# Patient Record
Sex: Male | Born: 1950 | Race: White | Hispanic: No | Marital: Married | Smoking: Current every day smoker
Health system: Southern US, Community
[De-identification: ages and names within clinical notes are randomized; demographics above are authoritative.]

## PROBLEM LIST (undated history)

## (undated) DIAGNOSIS — Z8639 Personal history of other endocrine, nutritional and metabolic disease: Secondary | ICD-10-CM

## (undated) DIAGNOSIS — I10 Essential (primary) hypertension: Secondary | ICD-10-CM

## (undated) DIAGNOSIS — G8929 Other chronic pain: Secondary | ICD-10-CM

## (undated) DIAGNOSIS — D751 Secondary polycythemia: Secondary | ICD-10-CM

## (undated) DIAGNOSIS — E669 Obesity, unspecified: Secondary | ICD-10-CM

## (undated) DIAGNOSIS — J449 Chronic obstructive pulmonary disease, unspecified: Secondary | ICD-10-CM

## (undated) DIAGNOSIS — I2781 Cor pulmonale (chronic): Secondary | ICD-10-CM

## (undated) DIAGNOSIS — Z87898 Personal history of other specified conditions: Secondary | ICD-10-CM

## (undated) DIAGNOSIS — Z8701 Personal history of pneumonia (recurrent): Secondary | ICD-10-CM

## (undated) DIAGNOSIS — K219 Gastro-esophageal reflux disease without esophagitis: Secondary | ICD-10-CM

## (undated) DIAGNOSIS — G4733 Obstructive sleep apnea (adult) (pediatric): Secondary | ICD-10-CM

## (undated) HISTORY — DX: Gastro-esophageal reflux disease without esophagitis: K21.9

## (undated) HISTORY — DX: Obstructive sleep apnea (adult) (pediatric): G47.33

## (undated) HISTORY — DX: Other chronic pain: G89.29

## (undated) HISTORY — PX: VASECTOMY: SHX75

## (undated) HISTORY — DX: Personal history of other specified conditions: Z87.898

## (undated) HISTORY — DX: Personal history of other endocrine, nutritional and metabolic disease: Z86.39

## (undated) HISTORY — DX: Personal history of pneumonia (recurrent): Z87.01

## (undated) HISTORY — DX: Essential (primary) hypertension: I10

## (undated) HISTORY — DX: Cor pulmonale (chronic): I27.81

## (undated) HISTORY — DX: Chronic obstructive pulmonary disease, unspecified: J44.9

## (undated) HISTORY — PX: NEUROPLASTY / TRANSPOSITION MEDIAN NERVE AT CARPAL TUNNEL: SUR893

---

## 2004-12-17 ENCOUNTER — Ambulatory Visit: Payer: Self-pay | Admitting: Family Medicine

## 2006-04-08 ENCOUNTER — Ambulatory Visit (HOSPITAL_COMMUNITY): Admission: RE | Admit: 2006-04-08 | Discharge: 2006-04-08 | Payer: Self-pay | Admitting: Orthopaedic Surgery

## 2006-07-29 HISTORY — PX: CARPAL TUNNEL RELEASE: SHX101

## 2013-04-26 ENCOUNTER — Ambulatory Visit: Payer: Self-pay | Admitting: Cardiology

## 2013-05-04 ENCOUNTER — Ambulatory Visit: Payer: Self-pay | Admitting: Cardiology

## 2014-06-10 ENCOUNTER — Inpatient Hospital Stay (HOSPITAL_COMMUNITY)
Admission: EM | Admit: 2014-06-10 | Discharge: 2014-06-16 | DRG: 291 | Disposition: A | Discharge status: Home / Self Care | Payer: Managed Care, Other (non HMO) | Attending: Internal Medicine | Admitting: Internal Medicine

## 2014-06-10 ENCOUNTER — Encounter (HOSPITAL_COMMUNITY): Payer: Self-pay | Admitting: *Deleted

## 2014-06-10 ENCOUNTER — Emergency Department (HOSPITAL_COMMUNITY): Payer: Managed Care, Other (non HMO)

## 2014-06-10 DIAGNOSIS — E669 Obesity, unspecified: Secondary | ICD-10-CM

## 2014-06-10 DIAGNOSIS — G4733 Obstructive sleep apnea (adult) (pediatric): Secondary | ICD-10-CM | POA: Diagnosis present

## 2014-06-10 DIAGNOSIS — J9621 Acute and chronic respiratory failure with hypoxia: Secondary | ICD-10-CM | POA: Diagnosis present

## 2014-06-10 DIAGNOSIS — I10 Essential (primary) hypertension: Secondary | ICD-10-CM | POA: Diagnosis present

## 2014-06-10 DIAGNOSIS — F17210 Nicotine dependence, cigarettes, uncomplicated: Secondary | ICD-10-CM | POA: Diagnosis present

## 2014-06-10 DIAGNOSIS — G4730 Sleep apnea, unspecified: Secondary | ICD-10-CM

## 2014-06-10 DIAGNOSIS — I5031 Acute diastolic (congestive) heart failure: Principal | ICD-10-CM | POA: Diagnosis present

## 2014-06-10 DIAGNOSIS — D45 Polycythemia vera: Secondary | ICD-10-CM | POA: Diagnosis present

## 2014-06-10 DIAGNOSIS — Z9119 Patient's noncompliance with other medical treatment and regimen: Secondary | ICD-10-CM | POA: Diagnosis present

## 2014-06-10 DIAGNOSIS — J9620 Acute and chronic respiratory failure, unspecified whether with hypoxia or hypercapnia: Secondary | ICD-10-CM

## 2014-06-10 DIAGNOSIS — I509 Heart failure, unspecified: Secondary | ICD-10-CM

## 2014-06-10 DIAGNOSIS — Z6841 Body Mass Index (BMI) 40.0 and over, adult: Secondary | ICD-10-CM | POA: Diagnosis not present

## 2014-06-10 DIAGNOSIS — R0602 Shortness of breath: Secondary | ICD-10-CM | POA: Insufficient documentation

## 2014-06-10 DIAGNOSIS — Z79899 Other long term (current) drug therapy: Secondary | ICD-10-CM

## 2014-06-10 DIAGNOSIS — I272 Other secondary pulmonary hypertension: Secondary | ICD-10-CM | POA: Diagnosis present

## 2014-06-10 DIAGNOSIS — D751 Secondary polycythemia: Secondary | ICD-10-CM | POA: Diagnosis present

## 2014-06-10 DIAGNOSIS — J441 Chronic obstructive pulmonary disease with (acute) exacerbation: Secondary | ICD-10-CM | POA: Diagnosis present

## 2014-06-10 HISTORY — DX: Obesity, unspecified: E66.9

## 2014-06-10 HISTORY — DX: Secondary polycythemia: D75.1

## 2014-06-10 LAB — HEPATIC FUNCTION PANEL
ALT: 31 U/L (ref 0–53)
AST: 57 U/L — AB (ref 0–37)
Albumin: 3.4 g/dL — ABNORMAL LOW (ref 3.5–5.2)
Alkaline Phosphatase: 60 U/L (ref 39–117)
Bilirubin, Direct: <0.2 mg/dL (ref 0.0–0.3)
TOTAL PROTEIN: 6.2 g/dL (ref 6.0–8.3)
Total Bilirubin: 1.2 mg/dL (ref 0.3–1.2)

## 2014-06-10 LAB — BASIC METABOLIC PANEL
Anion gap: 14 (ref 5–15)
BUN: 19 mg/dL (ref 6–23)
CO2: 27 mEq/L (ref 19–32)
CREATININE: 0.95 mg/dL (ref 0.50–1.35)
Calcium: 8.7 mg/dL (ref 8.4–10.5)
Chloride: 98 mEq/L (ref 96–112)
GFR calc non Af Amer: 88 mL/min — ABNORMAL LOW (ref >90)
Glucose, Bld: 96 mg/dL (ref 70–99)
Potassium: 5.1 mEq/L (ref 3.7–5.3)
Sodium: 139 mEq/L (ref 137–147)

## 2014-06-10 LAB — I-STAT TROPONIN, ED: Troponin i, poc: 0 ng/mL (ref 0.00–0.08)

## 2014-06-10 LAB — CBC
HEMATOCRIT: 61.7 % — AB (ref 39.0–52.0)
Hemoglobin: 19.6 g/dL — ABNORMAL HIGH (ref 13.0–17.0)
MCH: 27.1 pg (ref 26.0–34.0)
MCHC: 31.8 g/dL (ref 30.0–36.0)
MCV: 85.3 fL (ref 78.0–100.0)
Platelets: 121 10*3/uL — ABNORMAL LOW (ref 150–400)
RBC: 7.23 MIL/uL — ABNORMAL HIGH (ref 4.22–5.81)
RDW: 20.3 % — AB (ref 11.5–15.5)
WBC: 8.8 10*3/uL (ref 4.0–10.5)

## 2014-06-10 LAB — TROPONIN I: Troponin I: <0.3 ng/mL (ref <0.30)

## 2014-06-10 LAB — PRO B NATRIURETIC PEPTIDE: Pro B Natriuretic peptide (BNP): 2021 pg/mL — ABNORMAL HIGH (ref 0–125)

## 2014-06-10 MED ORDER — POLYETHYLENE GLYCOL 3350 17 G PO PACK
17.0000 g | PACK | Freq: Every day | ORAL | Status: DC | PRN
  Filled 2014-06-10: qty 1

## 2014-06-10 MED ORDER — NITROGLYCERIN 2 % TD OINT
0.5000 [in_us] | TOPICAL_OINTMENT | Freq: Four times a day (QID) | TRANSDERMAL | Status: DC
  Administered 2014-06-11 – 2014-06-15 (×17): 0.5 [in_us] via TOPICAL
  Filled 2014-06-10: qty 30
  Filled 2014-06-10: qty 1

## 2014-06-10 MED ORDER — ALUM & MAG HYDROXIDE-SIMETH 200-200-20 MG/5ML PO SUSP: 30.0000 mL | Freq: Four times a day (QID) | ORAL | Status: DC | PRN

## 2014-06-10 MED ORDER — DOXYCYCLINE HYCLATE 100 MG PO TABS
100.0000 mg | ORAL_TABLET | Freq: Two times a day (BID) | ORAL | Status: DC
  Filled 2014-06-10 (×3): qty 1

## 2014-06-10 MED ORDER — HYDROCODONE-ACETAMINOPHEN 5-325 MG PO TABS
1.0000 | ORAL_TABLET | ORAL | Status: DC | PRN
  Administered 2014-06-11 – 2014-06-16 (×10): 2 via ORAL
  Filled 2014-06-10 (×11): qty 2

## 2014-06-10 MED ORDER — SODIUM CHLORIDE 0.9 % IJ SOLN
3.0000 mL | Freq: Two times a day (BID) | INTRAMUSCULAR | Status: DC
  Administered 2014-06-10 – 2014-06-16 (×10): 3 mL via INTRAVENOUS

## 2014-06-10 MED ORDER — GUAIFENESIN-DM 100-10 MG/5ML PO SYRP
5.0000 mL | ORAL_SOLUTION | ORAL | Status: DC | PRN
Start: 2014-06-10 — End: 2014-06-16

## 2014-06-10 MED ORDER — ONDANSETRON HCL 4 MG/2ML IJ SOLN: 4.0000 mg | Freq: Four times a day (QID) | INTRAMUSCULAR | Status: DC | PRN

## 2014-06-10 MED ORDER — FAMOTIDINE 40 MG PO TABS
40.0000 mg | ORAL_TABLET | Freq: Every day | ORAL | Status: DC
  Administered 2014-06-10 – 2014-06-15 (×6): 40 mg via ORAL
  Filled 2014-06-10 (×7): qty 1

## 2014-06-10 MED ORDER — CARVEDILOL 3.125 MG PO TABS
3.1250 mg | ORAL_TABLET | Freq: Two times a day (BID) | ORAL | Status: DC
  Administered 2014-06-11 – 2014-06-12 (×4): 3.125 mg via ORAL
  Filled 2014-06-10 (×5): qty 1

## 2014-06-10 MED ORDER — ALBUTEROL SULFATE (2.5 MG/3ML) 0.083% IN NEBU
2.5000 mg | INHALATION_SOLUTION | Freq: Four times a day (QID) | RESPIRATORY_TRACT | Status: DC
  Administered 2014-06-10: 2.5 mg via RESPIRATORY_TRACT
  Filled 2014-06-10: qty 3

## 2014-06-10 MED ORDER — METHYLPREDNISOLONE SODIUM SUCC 125 MG IJ SOLR
60.0000 mg | Freq: Three times a day (TID) | INTRAMUSCULAR | Status: DC
  Administered 2014-06-12 – 2014-06-13 (×3): 60 mg via INTRAVENOUS
  Filled 2014-06-10 (×11): qty 0.96

## 2014-06-10 MED ORDER — IPRATROPIUM-ALBUTEROL 0.5-2.5 (3) MG/3ML IN SOLN: 3.0000 mL | Freq: Four times a day (QID) | RESPIRATORY_TRACT | Status: DC | PRN

## 2014-06-10 MED ORDER — ONDANSETRON HCL 4 MG PO TABS: 4.0000 mg | ORAL_TABLET | Freq: Four times a day (QID) | ORAL | Status: DC | PRN

## 2014-06-10 MED ORDER — TIOTROPIUM BROMIDE MONOHYDRATE 18 MCG IN CAPS
18.0000 ug | ORAL_CAPSULE | Freq: Every day | RESPIRATORY_TRACT | Status: DC
  Administered 2014-06-10 – 2014-06-16 (×7): 18 ug via RESPIRATORY_TRACT
  Filled 2014-06-10 (×2): qty 5

## 2014-06-10 MED ORDER — ASPIRIN EC 81 MG PO TBEC
81.0000 mg | DELAYED_RELEASE_TABLET | Freq: Every day | ORAL | Status: DC
  Administered 2014-06-10 – 2014-06-16 (×7): 81 mg via ORAL
  Filled 2014-06-10 (×7): qty 1

## 2014-06-10 MED ORDER — NITROGLYCERIN 2 % TD OINT
1.0000 [in_us] | TOPICAL_OINTMENT | Freq: Once | TRANSDERMAL | Status: AC
  Administered 2014-06-10: 1 [in_us] via TOPICAL

## 2014-06-10 MED ORDER — FUROSEMIDE 10 MG/ML IJ SOLN
60.0000 mg | Freq: Once | INTRAMUSCULAR | Status: AC
  Administered 2014-06-10: 60 mg via INTRAVENOUS
  Filled 2014-06-10: qty 6

## 2014-06-10 MED ORDER — ENOXAPARIN SODIUM 40 MG/0.4ML ~~LOC~~ SOLN
40.0000 mg | SUBCUTANEOUS | Status: DC
  Administered 2014-06-10: 40 mg via SUBCUTANEOUS
  Filled 2014-06-10 (×7): qty 0.4

## 2014-06-10 MED ORDER — FUROSEMIDE 10 MG/ML IJ SOLN
40.0000 mg | Freq: Two times a day (BID) | INTRAMUSCULAR | Status: DC
  Administered 2014-06-11 – 2014-06-16 (×10): 40 mg via INTRAVENOUS
  Filled 2014-06-10 (×14): qty 4

## 2014-06-10 NOTE — H&P (Signed)
Patient Demographics  Philip Powell, is a 63 y.o. male  MRN: [id number]   DOB - 1951/07/13  Admit Date - 06/10/2014  Outpatient Primary MD for the patient is No PCP Per Patient   With History of -  Past Medical History  Diagnosis Date  . Hypertension   . Bronchitis   . Polycythemia   . Sleep apnea   . Obesity       History reviewed. No pertinent past surgical history.  in for   Chief Complaint  Patient presents with  . Shortness of Breath  . Leg Swelling     HPI  Philip Powell  is a 64 y.o. 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   Review of Systems    In addition to the HPI above,   No Fever-chills, No Headache, No changes with Vision or hearing, No problems swallowing food or Liquids, No Chest pain, Cough , positive exertional Shortness of Breath some wheezing, No Abdominal pain, No Nausea or Vommitting, Bowel movements are regular, No Blood in stool or Urine, No dysuria, No new skin rashes or bruises, No new joints pains-aches,  No new weakness, tingling, numbness in any extremity, No recent weight  Loss, positive weight gain and edema No polyuria, polydypsia or polyphagia, No significant Mental  Stressors.  A full 10 point Review of Systems was done, except as stated above, all other Review of Systems were negative.   Social History History  Substance Use Topics  . Smoking status: Current Every Day Smoker    Types: Cigarettes  . Smokeless tobacco: Not on file  . Alcohol Use: No      Family History No history of CAD at young age  Prior to Admission medications   Medication Sig Start Date End Date Taking? Authorizing Provider  HYDROcodone-acetaminophen (NORCO) 7.5-325 MG per tablet Take 1 tablet by mouth every 6 (six) hours. 06/03/14  Yes Historical Provider, MD  ibuprofen (ADVIL,MOTRIN) 200 MG tablet Take 800 mg by mouth every 6 (six) hours as needed for mild pain.   Yes Historical Provider, MD  Multiple Vitamins-Minerals (CENTRUM ADULTS) TABS Take 1 tablet by mouth daily.   Yes Historical Provider, MD  ranitidine (ZANTAC) 150 MG tablet Take 150 mg by mouth 2 (two) times daily.   Yes Historical Provider, MD    No Known Allergies  Physical Exam  Vitals  Blood pressure 129/88, pulse 101, temperature 98.4 F (36.9 C), temperature source Oral, resp. rate 17, height 6' (1.829 m), weight 149.687 kg (330 lb), SpO2 94 %.   1. General Middle-aged morbidly obese Caucasian male lying in bed in NAD   2. Normal affect and insight, Not Suicidal or Homicidal, Awake Alert, Oriented X 3.  3. No F.N deficits, ALL C.Nerves Intact, Strength 5/5 all 4 extremities, Sensation intact all 4 extremities, Plantars down going.  4. Ears and Eyes appear Normal, Conjunctivae clear, PERRLA. Moist Oral Mucosa.  5. Supple Neck, elevated JVD, No cervical lymphadenopathy appriciated, No Carotid Bruits.  6. Symmetrical Chest wall movement, Good air movement bilaterally, bilateral rales and minimal wheezing  7. RRR, No Gallops, Rubs or Murmurs, No Parasternal Heave.  8. Positive Bowel Sounds, Abdomen Soft, No tenderness, No organomegaly appriciated,No rebound -guarding or rigidity.  9.  No  Cyanosis, Normal Skin Turgor, No Skin Rash or Bruise.2+ leg edema  10. Good muscle tone,  joints appear normal , no effusions, Normal ROM.  11. No Palpable Lymph Nodes in Neck or Axillae     Data Review  CBC  Recent Labs Lab 06/10/14 1518  WBC 8.8  HGB 19.6*  HCT 61.7*  PLT 121*  MCV 85.3  MCH 27.1  MCHC 31.8  RDW 20.3*   ------------------------------------------------------------------------------------------------------------------  Chemistries   Recent Labs Lab 06/10/14 1518  NA 139  K 5.1  CL 98  CO2 27  GLUCOSE 96  BUN 19  CREATININE 0.95  CALCIUM 8.7  AST 57*  ALT 31  ALKPHOS 60  BILITOT 1.2   ------------------------------------------------------------------------------------------------------------------ estimated creatinine clearance is 122.9 mL/min (by C-G formula based on Cr of 0.95). ------------------------------------------------------------------------------------------------------------------ No results for input(s): TSH, T4TOTAL, T3FREE, THYROIDAB in the last 72 hours.  Invalid input(s): FREET3   Coagulation profile No results for input(s): INR, PROTIME in the last 168 hours. ------------------------------------------------------------------------------------------------------------------- No results for input(s): DDIMER in the last 72 hours. -------------------------------------------------------------------------------------------------------------------  Cardiac Enzymes  Recent Labs Lab 06/10/14 1518  TROPONINI <0.30   ------------------------------------------------------------------------------------------------------------------ Invalid input(s): POCBNP   ---------------------------------------------------------------------------------------------------------------  Urinalysis No results found for: COLORURINE, APPEARANCEUR, LABSPEC, PHURINE, GLUCOSEU, HGBUR, BILIRUBINUR, KETONESUR, PROTEINUR, UROBILINOGEN, NITRITE,  LEUKOCYTESUR  ----------------------------------------------------------------------------------------------------------------  Imaging results:   Dg Chest 2 View  06/10/2014   CLINICAL DATA:  Shortness of breath, lower extremity edema the began 24 hr ago, smoker, hypertension  EXAM: CHEST  2 VIEW  COMPARISON:  Exam at 1534 hr compared to earlier exam of 06/10/2014 at 1255 hr  FINDINGS: Enlargement of cardiac silhouette with pulmonary vascular congestion.  Tortuous aorta.  Accentuation of perihilar interstitial markings most consistent with mild pulmonary edema.  No segmental consolidation, pleural effusion or pneumothorax.  Bones unremarkable.  IMPRESSION: Probable mild CHF.   Electronically Signed   By: Lavonia Dana M.D.   On: 06/10/2014 15:47    My personal review of EKG: Rhythm STach, Rate  100 /min,  no Acute ST changes    Assessment & Plan   1.Exertional shortness of breath/orthopnea.  Due to acute on chronic CHF nonspecific along with mild COPD exacerbation. He will be admitted to a telemetry bed, IV Lasix, Nitropaste, IV Solu-Medrol, scheduled and as needed nebulizer treatments, Spiriva inhalation, fluid and salt restriction, monitor weight intake and output, and Coreg. Check echogram.   2.COPD exacerbation. No previous diagnosis of COPD, but does smoke 1 pack a for last several years, likely undiagnosed COPD, IV Solu-Medrol, doxycycline, nebulizer treatments as above. Will benefit from outpatient pulmonary follow-up.  3. Obstructive sleep apnea. Outpatient pulmonary follow-up. Does not wear C Pap.   4. Polycythemia. Likely due to comminution of chronic smoking, morbid obesity with obstructive sleep apnea and COPD. Monitor.   5. Morbid obesity. Follow with PCP.   6. Essential hypertension. Coreg added.   DVT Prophylaxis   Lovenox    AM Labs Ordered, also please review Full Orders  Family Communication: Admission, patients condition and plan of care including tests  being ordered have been discussed with the patient and family who indicate understanding and agree with the plan and Code Status.  Code Status Full  Likely DC to  Home  Condition Fair  Time spent in minutes : 35    Josep Luviano K M.D on 06/10/2014 at 5:49 PM  Between 7am to 7pm - Pager - [phone]  After 7pm go to [email or web address] - password [email or web address]  And look for the night coverage person covering me after hours  [facility] Office  [phone]

## 2014-06-10 NOTE — ED Provider Notes (Signed)
CSN: [id number]     Arrival date & time 06/10/14  1448 History   First MD Initiated Contact with Patient 06/10/14 1614     Chief Complaint  Patient presents with  . Shortness of Breath  . Leg Swelling     (Consider location/radiation/quality/duration/timing/severity/associated sxs/prior Treatment) Patient is a 63 y.o. male presenting with shortness of breath. The history is provided by the patient. No language interpreter was used.  Shortness of Breath Severity:  Moderate Onset quality:  Gradual Duration:  1 day Timing:  Constant Progression:  Worsening Chronicity:  New Context: not URI   Relieved by:  Sitting up Exacerbated by: laying flat. Ineffective treatments:  Sitting up Associated symptoms: cough, PND, sputum production (clear) and wheezing   Associated symptoms: no abdominal pain, no chest pain, no fever, no headaches, no rash, no sore throat, no syncope and no vomiting   Risk factors: no family hx of DVT, no hx of PE/DVT and no prolonged immobilization     Past Medical History  Diagnosis Date  . Hypertension   . Bronchitis   . Polycythemia   . Sleep apnea   . Obesity    History reviewed. No pertinent past surgical history. History reviewed. No pertinent family history. History  Substance Use Topics  . Smoking status: Current Every Day Smoker    Types: Cigarettes  . Smokeless tobacco: Not on file  . Alcohol Use: No    Review of Systems  Constitutional: Negative for fever.  HENT: Negative for congestion, rhinorrhea and sore throat.   Respiratory: Positive for cough, sputum production (clear), shortness of breath and wheezing.   Cardiovascular: Positive for PND. Negative for chest pain and syncope.  Gastrointestinal: Negative for nausea, vomiting, abdominal pain and diarrhea.  Genitourinary: Negative for dysuria and hematuria.  Skin: Negative for rash.  Neurological: Negative for syncope, light-headedness and headaches.  All other systems reviewed and are  negative.     Allergies  Review of patient's allergies indicates no known allergies.  Home Medications   Prior to Admission medications   Medication Sig Start Date End Date Taking? Authorizing Provider  HYDROcodone-acetaminophen (NORCO) 7.5-325 MG per tablet Take 1 tablet by mouth every 6 (six) hours. 06/03/14  Yes Historical Provider, MD  ibuprofen (ADVIL,MOTRIN) 200 MG tablet Take 800 mg by mouth every 6 (six) hours as needed for mild pain.   Yes Historical Provider, MD  Multiple Vitamins-Minerals (CENTRUM ADULTS) TABS Take 1 tablet by mouth daily.   Yes Historical Provider, MD  ranitidine (ZANTAC) 150 MG tablet Take 150 mg by mouth 2 (two) times daily.   Yes Historical Provider, MD   BP 135/91 mmHg  Pulse 105  Temp(Src) 98.4 F (36.9 C) (Oral)  Resp 24  Ht 6' (1.829 m)  Wt 330 lb (149.687 kg)  BMI 44.75 kg/m2  SpO2 87% Physical Exam  Constitutional: He is oriented to person, place, and time. He appears well-developed and well-nourished.  HENT:  Head: Normocephalic and atraumatic.  Right Ear: External ear normal.  Left Ear: External ear normal.  Eyes: EOM are normal.  Neck: Normal range of motion. Neck supple. JVD (mild) present.  Cardiovascular: Normal rate, regular rhythm and intact distal pulses.  Exam reveals no gallop and no friction rub.   No murmur heard. Pulmonary/Chest: Effort normal. No respiratory distress. He has wheezes (bilaterally). He has no rales. He exhibits no tenderness.  Abdominal: Soft. Bowel sounds are normal. He exhibits no distension. There is no tenderness. There is no rebound.  Musculoskeletal:  Normal range of motion. He exhibits edema (2+ pitting edema). He exhibits no tenderness.  Lymphadenopathy:    He has no cervical adenopathy.  Neurological: He is alert and oriented to person, place, and time.  Skin: Skin is warm. No rash noted.  Psychiatric: He has a normal mood and affect. His behavior is normal.  Nursing note and vitals  reviewed.   ED Course  Procedures (including critical care time) Labs Review Labs Reviewed  CBC - Abnormal; Notable for the following:    RBC 7.23 (*)    Hemoglobin 19.6 (*)    HCT 61.7 (*)    RDW 20.3 (*)    Platelets 121 (*)    All other components within normal limits  BASIC METABOLIC PANEL - Abnormal; Notable for the following:    GFR calc non Af Amer 88 (*)    All other components within normal limits  PRO B NATRIURETIC PEPTIDE - Abnormal; Notable for the following:    Pro B Natriuretic peptide (BNP) 2021.0 (*)    All other components within normal limits  I-STAT TROPOININ, ED    Imaging Review Dg Chest 2 View  06/10/2014   CLINICAL DATA:  Shortness of breath, lower extremity edema the began 24 hr ago, smoker, hypertension  EXAM: CHEST  2 VIEW  COMPARISON:  Exam at 1534 hr compared to earlier exam of 06/10/2014 at 1255 hr  FINDINGS: Enlargement of cardiac silhouette with pulmonary vascular congestion.  Tortuous aorta.  Accentuation of perihilar interstitial markings most consistent with mild pulmonary edema.  No segmental consolidation, pleural effusion or pneumothorax.  Bones unremarkable.  IMPRESSION: Probable mild CHF.   Electronically Signed   By: Lavonia Dana M.D.   On: 06/10/2014 15:47     EKG Interpretation None      MDM   Final diagnoses:  Acute exacerbation of CHF (congestive heart failure)    4:19 PM Pt is a 63 y.o. male with pertinent PMHX of HTN, OSA, Bronchitis, polycythemia who presents to the ED with shortness of breath and leg swelling. Sent from [facility]. Worsening shortness of breath last night. Was hypoxic to 87%. No chest pain. No immobilization. No previous DVT or PE. Endorses orthopnea and PND. No fevers or recent illness. No nausea, vomiting or diarrhea. No syncope.does smoke. No chest pain. Low suspicion for PE  Concern for new onset heart failure versus COPD exacerbation versus pneumonia. Plan for CBC, BMP, BNP, troponin, LFTs, istat  troponin, EKG and CXR  CXR PA/LAt per my read concerning for cardiomegaly and vascular congestion  EKG personally reviewed by myself showed NSR, biatrial enlargement, right axis, RVH Rate of 100, PR 268ms, QRS 50ms QT/QTC 350/479ms, normal axis, without evidence of new ischemia. No Comparison, indication: shortness of breath  review of labs: CBC: no leukocytosis, H&H 19.6/61.7 BMP: no electrolyte abnormalities LFT: elevated AST Troponin: <0.30 BNP: 2021.0  Plan to start patient on nitro paste 1 inch and give lasix 60 mg. Will consult unassigned for admission for new onset heart failure  Plan for admission to [facility]   Labs, EKG and imaging reviewed by myself and considered in medical decision making if ordered.  Imaging interpreted by radiology. Pt was discussed with my attending, Dr. Marijo Conception, MD 06/11/14 0126  Orlie Dakin, MD 06/11/14 1447

## 2014-06-10 NOTE — ED Provider Notes (Signed)
Patient complains of dyspnea times several days.. Noted to be in congestive heart failure, has oxygen requirement. Plan diuresis, nitrates  Orlie Dakin, MD 06/10/14 1750

## 2014-06-10 NOTE — ED Notes (Signed)
Attempted to give report.  RN giving blood.

## 2014-06-10 NOTE — ED Notes (Signed)
Pt sent here from [facility]. Pt reports swelling to legs for extended amount of time, became worse last night. spo2 is low 87% at triage. Reports increase in sob recently and generalized fatigue.

## 2014-06-11 DIAGNOSIS — I270 Primary pulmonary hypertension: Secondary | ICD-10-CM

## 2014-06-11 DIAGNOSIS — I517 Cardiomegaly: Secondary | ICD-10-CM

## 2014-06-11 DIAGNOSIS — Z9111 Patient's noncompliance with dietary regimen: Secondary | ICD-10-CM

## 2014-06-11 LAB — BASIC METABOLIC PANEL
ANION GAP: 13 (ref 5–15)
BUN: 20 mg/dL (ref 6–23)
CHLORIDE: 99 meq/L (ref 96–112)
CO2: 33 mEq/L — ABNORMAL HIGH (ref 19–32)
Calcium: 8.5 mg/dL (ref 8.4–10.5)
Creatinine, Ser: 1.04 mg/dL (ref 0.50–1.35)
GFR calc Af Amer: 88 mL/min — ABNORMAL LOW (ref >90)
GFR calc non Af Amer: 76 mL/min — ABNORMAL LOW (ref >90)
Glucose, Bld: 80 mg/dL (ref 70–99)
POTASSIUM: 4.6 meq/L (ref 3.7–5.3)
SODIUM: 145 meq/L (ref 137–147)

## 2014-06-11 LAB — CBC
HCT: 63.9 % — ABNORMAL HIGH (ref 39.0–52.0)
HEMOGLOBIN: 19.3 g/dL — AB (ref 13.0–17.0)
MCH: 26.7 pg (ref 26.0–34.0)
MCHC: 30.2 g/dL (ref 30.0–36.0)
MCV: 88.4 fL (ref 78.0–100.0)
PLATELETS: 124 10*3/uL — AB (ref 150–400)
RBC: 7.23 MIL/uL — AB (ref 4.22–5.81)
RDW: 20.8 % — ABNORMAL HIGH (ref 11.5–15.5)
WBC: 10 10*3/uL (ref 4.0–10.5)

## 2014-06-11 MED ORDER — AMOXICILLIN-POT CLAVULANATE 875-125 MG PO TABS
1.0000 | ORAL_TABLET | Freq: Two times a day (BID) | ORAL | Status: DC
  Administered 2014-06-11 – 2014-06-16 (×11): 1 via ORAL
  Filled 2014-06-11 (×12): qty 1

## 2014-06-11 NOTE — Progress Notes (Signed)
Echo Lab  2D Echocardiogram completed.  Kirk, RDCS 06/11/2014 2:22 PM

## 2014-06-11 NOTE — Progress Notes (Signed)
[facility]ESS NOTE  Philip Powell HCW:[id number] DOB: Nov 06, 1950 DOA: 06/10/2014 PCP: No PCP Per Patient  Assessment/Plan: 1. Acute sob 1. Likely secondary to combination of copd with suspected acute pulm htn exacerbation 2. Scheduled nebs and steroids ordered, as well as lasix 3. Cont to wean o2 as tolerated 2. Copd exacerbation 1. Marked wheezing B 2. On scheduled nebs 3. Pt reportedly refusing solumedrol because he does not feel he needs it 3. Suspected acutely decompensated pulmonary hypertension 1. On lasix 2. 2d echo with normal EF with moderate pulm htn 4. Polycythemia 1. Likely secondary to tobacco abuse 5. Morbid obesity 1. stable 6. OSA 1. Refuses CPAP 7. HTN 1. Stable thus far 8. DVT prophylaxis 1. Lovenox  Code Status: Full Family Communication: Pt in room (indicate person spoken with, relationship, and if by phone, the number) Disposition Plan: Pending   Consultants:    Procedures:    Antibiotics:  Doxycycline 11/14>>>  augmentin 11/14>>>   HPI/Subjective: Pt reportedly refusing solumedrol and other treatments because he "doesn't think he needs them."  Objective: Filed Vitals:   06/10/14 1946 06/10/14 2052 06/11/14 0451 06/11/14 1401  BP: 121/71  114/80 144/85  Pulse: 98  98 94  Temp: 97.6 F (36.4 C)  98.3 F (36.8 C) 97.8 F (36.6 C)  TempSrc: Oral  Oral Oral  Resp: 18  18 20   Height: 6' (1.829 m)     Weight: 147.873 kg (326 lb)  147.192 kg (324 lb 8 oz)   SpO2: 90% 93% 94% 93%    Intake/Output Summary (Last 24 hours) at 06/11/14 1650 Last data filed at 06/11/14 1343  Gross per 24 hour  Intake   1080 ml  Output   5375 ml  Net  -4295 ml   Filed Weights   06/10/14 1455 06/10/14 1946 06/11/14 0451  Weight: 149.687 kg (330 lb) 147.873 kg (326 lb) 147.192 kg (324 lb 8 oz)    Exam:   General:  Awake, in nad  Cardiovascular: regular, s1, s2  Respiratory: normal resp effort, no wheezing  Abdomen: soft,  obese, nondistended  Musculoskeletal: perfused, no clubbing   Data Reviewed: Basic Metabolic Panel:  Recent Labs Lab 06/10/14 1518 06/11/14 0447  NA 139 145  K 5.1 4.6  CL 98 99  CO2 27 33*  GLUCOSE 96 80  BUN 19 20  CREATININE 0.95 1.04  CALCIUM 8.7 8.5   Liver Function Tests:  Recent Labs Lab 06/10/14 1518  AST 57*  ALT 31  ALKPHOS 60  BILITOT 1.2  PROT 6.2  ALBUMIN 3.4*   No results for input(s): LIPASE, AMYLASE in the last 168 hours. No results for input(s): AMMONIA in the last 168 hours. CBC:  Recent Labs Lab 06/10/14 1518 06/11/14 0447  WBC 8.8 10.0  HGB 19.6* 19.3*  HCT 61.7* 63.9*  MCV 85.3 88.4  PLT 121* 124*   Cardiac Enzymes:  Recent Labs Lab 06/10/14 1518  TROPONINI <0.30   BNP (last 3 results)  Recent Labs  06/10/14 1518  PROBNP 2021.0*   CBG: No results for input(s): GLUCAP in the last 168 hours.  No results found for this or any previous visit (from the past 240 hour(s)).   Studies: Dg Chest 2 View  06/10/2014   CLINICAL DATA:  Shortness of breath, lower extremity edema the began 24 hr ago, smoker, hypertension  EXAM: CHEST  2 VIEW  COMPARISON:  Exam at 1534 hr compared to earlier exam of 06/10/2014 at 1255 hr  FINDINGS: Enlargement  of cardiac silhouette with pulmonary vascular congestion.  Tortuous aorta.  Accentuation of perihilar interstitial markings most consistent with mild pulmonary edema.  No segmental consolidation, pleural effusion or pneumothorax.  Bones unremarkable.  IMPRESSION: Probable mild CHF.   Electronically Signed   By: Lavonia Dana M.D.   On: 06/10/2014 15:47    Scheduled Meds: . amoxicillin-clavulanate  1 tablet Oral Q12H  . aspirin EC  81 mg Oral Daily  . carvedilol  3.125 mg Oral BID WC  . enoxaparin (LOVENOX) injection  40 mg Subcutaneous Q24H  . famotidine  40 mg Oral QHS  . furosemide  40 mg Intravenous BID  . methylPREDNISolone (SOLU-MEDROL) injection  60 mg Intravenous 3 times per day  .  nitroGLYCERIN  0.5 inch Topical 4 times per day  . sodium chloride  3 mL Intravenous Q12H  . tiotropium  18 mcg Inhalation Daily   Continuous Infusions:   Principal Problem:   Acute exacerbation of CHF (congestive heart failure) Active Problems:   Hypertension   Sleep apnea   Obesity   Polycythemia   CHF (congestive heart failure)  Time spent: 50min  Philip Powell, [facility] Pager [phone]. If 7PM-7AM, please contact night-coverage at [email or web address], password [facility] 06/11/2014, 4:50 PM  LOS: 1 day

## 2014-06-11 NOTE — Progress Notes (Signed)
MD, Pt does take testosterone injections biweekly through pt's primary MD, just wanted to let you know, thanks Arvella Nigh RN.

## 2014-06-11 NOTE — Plan of Care (Signed)
Problem: Phase I Progression Outcomes Goal: EF % per last Echo/documented,Core Reminder form on chart Outcome: Progressing Echo has been scheduled for today.

## 2014-06-12 LAB — BASIC METABOLIC PANEL
Anion gap: 7 (ref 5–15)
BUN: 16 mg/dL (ref 6–23)
CO2: 39 meq/L — AB (ref 19–32)
Calcium: 8.7 mg/dL (ref 8.4–10.5)
Chloride: 93 mEq/L — ABNORMAL LOW (ref 96–112)
Creatinine, Ser: 0.85 mg/dL (ref 0.50–1.35)
GFR calc Af Amer: >90 mL/min (ref >90)
GLUCOSE: 136 mg/dL — AB (ref 70–99)
POTASSIUM: 4.2 meq/L (ref 3.7–5.3)
SODIUM: 139 meq/L (ref 137–147)

## 2014-06-12 MED ORDER — IPRATROPIUM-ALBUTEROL 0.5-2.5 (3) MG/3ML IN SOLN
3.0000 mL | Freq: Two times a day (BID) | RESPIRATORY_TRACT | Status: DC
  Administered 2014-06-13 – 2014-06-15 (×5): 3 mL via RESPIRATORY_TRACT
  Filled 2014-06-12 (×5): qty 3

## 2014-06-12 MED ORDER — IPRATROPIUM-ALBUTEROL 0.5-2.5 (3) MG/3ML IN SOLN
3.0000 mL | RESPIRATORY_TRACT | Status: DC | PRN
  Administered 2014-06-14: 3 mL via RESPIRATORY_TRACT
  Filled 2014-06-12: qty 3

## 2014-06-12 MED ORDER — IPRATROPIUM-ALBUTEROL 0.5-2.5 (3) MG/3ML IN SOLN
RESPIRATORY_TRACT | Status: AC
  Administered 2014-06-12: 3 mL via RESPIRATORY_TRACT
  Filled 2014-06-12: qty 3

## 2014-06-12 MED ORDER — IPRATROPIUM-ALBUTEROL 0.5-2.5 (3) MG/3ML IN SOLN
3.0000 mL | RESPIRATORY_TRACT | Status: DC
  Administered 2014-06-12 (×3): 3 mL via RESPIRATORY_TRACT
  Filled 2014-06-12 (×2): qty 3

## 2014-06-12 NOTE — Plan of Care (Signed)
Problem: Phase I Progression Outcomes Goal: Pain controlled with appropriate interventions Outcome: Completed/Met Date Met:  06/12/14 Goal: EF % per last Echo/documented,Core Reminder form on chart Outcome: Completed/Met Date Met:  06/12/14 EF 65-70%(06-11-14) Goal: Voiding-avoid urinary catheter unless indicated Outcome: Completed/Met Date Met:  06/12/14

## 2014-06-12 NOTE — Plan of Care (Signed)
Problem: Consults Goal: Tobacco Cessation referral if indicated Outcome: Not Progressing Client is a current smoker and has been given the smoking cessation education.  Currently does not verbalize that he will be quitting anytime soon.

## 2014-06-12 NOTE — Progress Notes (Signed)
[facility] PROGRESS NOTE  Philip Powell EHU:[id number] DOB: [birth year] DOA: 06/10/2014 PCP: No PCP Per Patient  Assessment/Plan: 1. Acute sob 1. Likely secondary to combination of copd with suspected acute pulm htn exacerbation 2. Scheduled nebs and steroids ordered, as well as lasix 3. Cont to wean o2 as tolerated 2. Copd exacerbation 1. Marked wheezing B 2. On scheduled nebs 3. Pt had been refusing solumedrol because he does not feel he needs it, believing it was to "prevent pneumonia" 4. Pt was re-educated on importance of solumedrol and states he will take it 3. Suspected acutely decompensated pulmonary hypertension 1. On lasix 2. 2d echo with normal EF with moderate pulm htn 4. Polycythemia 1. Likely secondary to tobacco abuse 5. Morbid obesity 1. stable 6. OSA 1. Refuses CPAP 7. HTN 1. Stable thus far 8. DVT prophylaxis 1. Lovenox  Code Status: Full Family Communication: Pt in room Disposition Plan: Pending   Consultants:    Procedures:    Antibiotics:  Doxycycline 11/14>>>  augmentin 11/14>>>   HPI/Subjective: No acute events noted overnight. Still sob  Objective: Filed Vitals:   06/12/14 0604 06/12/14 0916 06/12/14 1451 06/12/14 1534  BP: 129/50  112/62   Pulse: 100  108   Temp: 98.4 F (36.9 C)  98.7 F (37.1 C)   TempSrc: Oral  Oral   Resp: 18  20   Height:      Weight: 144.516 kg (318 lb 9.6 oz)     SpO2: 90% 93% 93% 84%    Intake/Output Summary (Last 24 hours) at 06/12/14 1545 Last data filed at 06/12/14 1455  Gross per 24 hour  Intake   1640 ml  Output   4300 ml  Net  -2660 ml   Filed Weights   06/10/14 1946 06/11/14 0451 06/12/14 0604  Weight: 147.873 kg (326 lb) 147.192 kg (324 lb 8 oz) 144.516 kg (318 lb 9.6 oz)    Exam:   General:  Awake, in nad  Cardiovascular: regular, s1, s2  Respiratory: normal resp effort, no wheezing  Abdomen: soft, obese, nondistended  Musculoskeletal: perfused, no clubbing    Data Reviewed: Basic Metabolic Panel:  Recent Labs Lab 06/10/14 1518 06/11/14 0447 06/12/14 0348  NA 139 145 139  K 5.1 4.6 4.2  CL 98 99 93*  CO2 27 33* 39*  GLUCOSE 96 80 136*  BUN 19 20 16   CREATININE 0.95 1.04 0.85  CALCIUM 8.7 8.5 8.7   Liver Function Tests:  Recent Labs Lab 06/10/14 1518  AST 57*  ALT 31  ALKPHOS 60  BILITOT 1.2  PROT 6.2  ALBUMIN 3.4*   No results for input(s): LIPASE, AMYLASE in the last 168 hours. No results for input(s): AMMONIA in the last 168 hours. CBC:  Recent Labs Lab 06/10/14 1518 06/11/14 0447  WBC 8.8 10.0  HGB 19.6* 19.3*  HCT 61.7* 63.9*  MCV 85.3 88.4  PLT 121* 124*   Cardiac Enzymes:  Recent Labs Lab 06/10/14 1518  TROPONINI <0.30   BNP (last 3 results)  Recent Labs  06/10/14 1518  PROBNP 2021.0*   CBG: No results for input(s): GLUCAP in the last 168 hours.  No results found for this or any previous visit (from the past 240 hour(s)).   Studies: No results found.  Scheduled Meds: . amoxicillin-clavulanate  1 tablet Oral Q12H  . aspirin EC  81 mg Oral Daily  . carvedilol  3.125 mg Oral BID WC  . enoxaparin (LOVENOX) injection  40 mg Subcutaneous Q24H  .  famotidine  40 mg Oral QHS  . furosemide  40 mg Intravenous BID  . ipratropium-albuterol  3 mL Nebulization Q4H  . methylPREDNISolone (SOLU-MEDROL) injection  60 mg Intravenous 3 times per day  . nitroGLYCERIN  0.5 inch Topical 4 times per day  . sodium chloride  3 mL Intravenous Q12H  . tiotropium  18 mcg Inhalation Daily   Continuous Infusions:   Principal Problem:   Acute exacerbation of CHF (congestive heart failure) Active Problems:   Hypertension   Sleep apnea   Obesity   Polycythemia   CHF (congestive heart failure)  Time spent: 70min  Philip Powell, [facility] Pager [phone]. If 7PM-7AM, please contact night-coverage at [email or web address], password [facility] 06/12/2014, 3:45 PM  LOS: 2 days

## 2014-06-13 LAB — BASIC METABOLIC PANEL
Anion gap: 11 (ref 5–15)
BUN: 15 mg/dL (ref 6–23)
CHLORIDE: 94 meq/L — AB (ref 96–112)
CO2: 36 meq/L — AB (ref 19–32)
Calcium: 8.6 mg/dL (ref 8.4–10.5)
Creatinine, Ser: 0.8 mg/dL (ref 0.50–1.35)
GFR calc Af Amer: >90 mL/min (ref >90)
GFR calc non Af Amer: >90 mL/min (ref >90)
Glucose, Bld: 188 mg/dL — ABNORMAL HIGH (ref 70–99)
POTASSIUM: 4.4 meq/L (ref 3.7–5.3)
SODIUM: 141 meq/L (ref 137–147)

## 2014-06-13 MED ORDER — CARVEDILOL 3.125 MG PO TABS
3.1250 mg | ORAL_TABLET | Freq: Two times a day (BID) | ORAL | Status: DC
  Administered 2014-06-13 – 2014-06-16 (×7): 3.125 mg via ORAL
  Filled 2014-06-13 (×9): qty 1

## 2014-06-13 MED ORDER — METHYLPREDNISOLONE SODIUM SUCC 125 MG IJ SOLR
60.0000 mg | Freq: Two times a day (BID) | INTRAMUSCULAR | Status: DC
  Administered 2014-06-13 – 2014-06-15 (×4): 60 mg via INTRAVENOUS
  Filled 2014-06-13 (×6): qty 0.96

## 2014-06-13 NOTE — Care Management Note (Unsigned)
    Page 1 of 2   06/16/2014     4:26:44 PM CARE MANAGEMENT NOTE 06/16/2014  Patient:  Philip Powell, Philip Powell   Account Number:  [id number]  Date Initiated:  06/13/2014  Documentation initiated by:  Adolf Ormiston  Subjective/Objective Assessment:   Pt adm on 06/10/14 with CHF and COPD exacerbation.  PTA, pt independent, lives with spouse.  Pt has OSA, but refuses to wear CPAP at home.     Action/Plan:   Will follow for dc needs at pt progresses.   Anticipated DC Date:  06/15/2014   Anticipated DC Plan:  [place]  CM consult  PCP issues  Follow-up appt scheduled      Choice offered to / List presented to:     DME arranged  [facility]      DME agency  [facility].        Status of service:  Completed, signed off Medicare Important Message given?   (If response is "NO", the following Medicare IM given date fields will be blank) Date Medicare IM given:   Medicare IM given by:   Date Additional Medicare IM given:   Additional Medicare IM given by:    Discharge Disposition:  HOME/SELF CARE  Per UR Regulation:  Reviewed for med. necessity/level of care/duration of stay  If discussed at [place] of Stay Meetings, dates discussed:   06/16/2014    Comments:  06/16/14 Ellan Lambert, RN, BSN [phone] Pt for dc home today with spouse.  He will need home O2; referral to [facility] for home O2 set up.  PCP is Sharlee Blew is currently on FMLA.  Appt made with PA for 11/24 at 9:30, and info put on AVS.  Faxed clinical info to MD office at [phone].  06/14/14 Ellan Lambert, RN, BSN [phone] Notified by tech that pt has been dropping O2 sats as low as 86% on RA. Tech to ambulate on RA and document sats. Met with pt and wife--mentioned possibility of home oxygen--pt states "my doctor is going to keep me here until I don't need oxygen."   Asked how he is supposed to work on oxygen...stated that pt may want to consider taking some time off work,  until O2 is not needed.  Will follow progress/check sats when available.

## 2014-06-13 NOTE — Progress Notes (Signed)
Nutrition Education Note  RD consulted for nutrition education regarding new onset CHF.   RD provided "Low Sodium Nutrition Therapy" handout from the Academy of [employer]. Reviewed patient's dietary recall. Provided examples on ways to decrease sodium intake in diet. Discouraged intake of processed foods and use of salt shaker. Encouraged fresh fruits and vegetables as well as whole grain sources of carbohydrates to maximize fiber intake.   RD discussed why it is important for patient to adhere to diet recommendations, and emphasized the role of fluids, foods to avoid, and importance of weighing self daily. Teach back method used. Patient's wife present for education.   Expect good compliance.  Body mass index is 43.11 kg/(m^2). Pt meets criteria for Morbid Obesity based on current BMI.  Current diet order is Heart Healthy, patient is consuming approximately 100% of meals at this time. Labs and medications reviewed. No further nutrition interventions warranted at this time. RD contact information provided. If additional nutrition issues arise, please re-consult RD.   Pryor Ochoa RD, LDN Inpatient Clinical Dietitian Pager: [phone] After Hours Pager: [phone]

## 2014-06-13 NOTE — Progress Notes (Signed)
Pt continues with noncompliance and distrust of plan of care. Pt plans to refuse AM dose of solu-medrol after receiving HS dose during this shift. Pt's son has been with pt throughout this shift in effort to encourage best chance of pt compliance. RN wary of pt compliance when son not available and after pt is discharged, as diagnosis of CHF is a chronic condition. Will continue to monitor.

## 2014-06-13 NOTE — Care Management Utilization Note (Signed)
UR completed.    Rishik Tubby Wise Stephanine Reas, RN, BSN Phone #[phone]  

## 2014-06-13 NOTE — Progress Notes (Signed)
[facility] PROGRESS NOTE  Tennis Mckinnon Groh ZOX:[id number] DOB: March 12, 1951 DOA: 06/10/2014 PCP: No PCP Per Patient  Assessment/Plan: 1. Acute sob 1. Likely secondary to combination of copd with suspected acute pulm htn exacerbation 2. Scheduled nebs and steroids ordered, as well as lasix 3. Cont to wean o2 as tolerated 2. Copd exacerbation 1. Wheezing B, improving 2. On scheduled nebs. On solumedrol 3. Initially refused solumedrol, however, pt is now agreeable to taking it 3. Suspected acutely decompensated pulmonary hypertension 1. On lasix 2. 2d echo with normal EF with moderate pulm htn 3. Thus far, net neg 9L 4. Pt still with signs of volume overload 4. Polycythemia 1. Likely secondary to tobacco abuse 5. Morbid obesity 1. stable 6. OSA 1. Refuses CPAP 2. States he has CPAP at home, but does not use it because he believes his OSA is the result of GERD 7. HTN 1. Stable thus far 8. DVT prophylaxis 1. Lovenox  Code Status: Full Family Communication: Pt in room Disposition Plan: Pending   Consultants:    Procedures:    Antibiotics:  Doxycycline 11/14>>>  augmentin 11/14>>>   HPI/Subjective: Slowly improving. Still sob. Eager to go home.  Objective: Filed Vitals:   06/12/14 2043 06/13/14 0556 06/13/14 0915 06/13/14 1011  BP: 117/56 133/88  152/73  Pulse: 102 95  103  Temp: 99 F (37.2 C) 98.8 F (37.1 C)    TempSrc: Oral Oral    Resp: 18 20    Height:      Weight:  144.2 kg (317 lb 14.5 oz)    SpO2: 92% 87% 94%     Intake/Output Summary (Last 24 hours) at 06/13/14 1542 Last data filed at 06/13/14 1251  Gross per 24 hour  Intake   1200 ml  Output   3250 ml  Net  -2050 ml   Filed Weights   06/11/14 0451 06/12/14 0604 06/13/14 0556  Weight: 147.192 kg (324 lb 8 oz) 144.516 kg (318 lb 9.6 oz) 144.2 kg (317 lb 14.5 oz)    Exam:   General:  Awake, in nad  Cardiovascular: regular, s1, s2  Respiratory: normal resp effort, no  wheezing  Abdomen: soft, obese, nondistended  Musculoskeletal: perfused, no clubbing   Data Reviewed: Basic Metabolic Panel:  Recent Labs Lab 06/10/14 1518 06/11/14 0447 06/12/14 0348 06/13/14 0339  NA 139 145 139 141  K 5.1 4.6 4.2 4.4  CL 98 99 93* 94*  CO2 27 33* 39* 36*  GLUCOSE 96 80 136* 188*  BUN 19 20 16 15   CREATININE 0.95 1.04 0.85 0.80  CALCIUM 8.7 8.5 8.7 8.6   Liver Function Tests:  Recent Labs Lab 06/10/14 1518  AST 57*  ALT 31  ALKPHOS 60  BILITOT 1.2  PROT 6.2  ALBUMIN 3.4*   No results for input(s): LIPASE, AMYLASE in the last 168 hours. No results for input(s): AMMONIA in the last 168 hours. CBC:  Recent Labs Lab 06/10/14 1518 06/11/14 0447  WBC 8.8 10.0  HGB 19.6* 19.3*  HCT 61.7* 63.9*  MCV 85.3 88.4  PLT 121* 124*   Cardiac Enzymes:  Recent Labs Lab 06/10/14 1518  TROPONINI <0.30   BNP (last 3 results)  Recent Labs  06/10/14 1518  PROBNP 2021.0*   CBG: No results for input(s): GLUCAP in the last 168 hours.  No results found for this or any previous visit (from the past 240 hour(s)).   Studies: No results found.  Scheduled Meds: . amoxicillin-clavulanate  1 tablet Oral Q12H  .  aspirin EC  81 mg Oral Daily  . carvedilol  3.125 mg Oral BID WC  . enoxaparin (LOVENOX) injection  40 mg Subcutaneous Q24H  . famotidine  40 mg Oral QHS  . furosemide  40 mg Intravenous BID  . ipratropium-albuterol  3 mL Nebulization BID  . methylPREDNISolone (SOLU-MEDROL) injection  60 mg Intravenous Q12H  . nitroGLYCERIN  0.5 inch Topical 4 times per day  . sodium chloride  3 mL Intravenous Q12H  . tiotropium  18 mcg Inhalation Daily   Continuous Infusions:   Principal Problem:   Acute exacerbation of CHF (congestive heart failure) Active Problems:   Hypertension   Sleep apnea   Obesity   Polycythemia   CHF (congestive heart failure)  Time spent: 43min  Irania Durell, [facility] Pager [phone]. If 7PM-7AM, please  contact night-coverage at [email or web address], password [facility] 06/13/2014, 3:42 PM  LOS: 3 days

## 2014-06-13 NOTE — Plan of Care (Signed)
Problem: Phase I Progression Outcomes Goal: Dyspnea controlled at rest (HF) Outcome: Progressing Goal: Up in chair, BRP Outcome: Completed/Met Date Met:  06/13/14 Goal: Initial discharge plan identified Outcome: Completed/Met Date Met:  06/13/14 Goal: Hemodynamically stable Outcome: Completed/Met Date Met:  06/13/14

## 2014-06-14 LAB — BASIC METABOLIC PANEL
Anion gap: 10 (ref 5–15)
BUN: 14 mg/dL (ref 6–23)
CHLORIDE: 96 meq/L (ref 96–112)
CO2: 38 mEq/L — ABNORMAL HIGH (ref 19–32)
Calcium: 9.1 mg/dL (ref 8.4–10.5)
Creatinine, Ser: 0.76 mg/dL (ref 0.50–1.35)
GFR calc Af Amer: >90 mL/min (ref >90)
GFR calc non Af Amer: >90 mL/min (ref >90)
GLUCOSE: 124 mg/dL — AB (ref 70–99)
POTASSIUM: 4.5 meq/L (ref 3.7–5.3)
SODIUM: 144 meq/L (ref 137–147)

## 2014-06-14 NOTE — Progress Notes (Signed)
SATURATION QUALIFICATIONS: (This note is used to comply with regulatory documentation for home oxygen)  Patient Saturations on Room Air at Rest = *89%  Patient Saturations on Room Air while Ambulating = 88%  Patient Saturations on 2 Liters of oxygen while Ambulating = 90%  Please briefly explain why patient needs home oxygen:  Pt de-sating while ambulating without O2, and pt becomes tachycardic.

## 2014-06-14 NOTE — Progress Notes (Addendum)
[facility] PROGRESS NOTE  Ettore Trebilcock Archambeault TXM:[id number] DOB: 1951-01-15 DOA: 06/10/2014 PCP: No PCP Per Patient  Off Service Summary 63yo noncompliant male who presents with sob with wheezing and volume overload. 2d echo with mod-severe pulm edema with wheezing. Patient is continued on scheduled lasix and nebs. He initially refused solumedrol however now agrees to it. Thus far, pt is showing improvement, diuresing nicely.  Assessment/Plan: 1. Acute sob 1. Likely secondary to combination of copd with suspected acute pulm htn exacerbation 2. Scheduled nebs and steroids ordered, as well as lasix 3. Cont to wean o2 as tolerated 2. Copd exacerbation 1. Wheezing B, improving slowly 2. On scheduled nebs and solumedrol 3. Initially refused solumedrol, believing he does not need it, however, pt is now agreeable to taking it 3. Suspected acutely decompensated pulmonary hypertension 1. On lasix 2. 2d echo with normal EF with moderate pulm htn 3. Thus far, net neg 11.3L 4. Pt still with signs of volume overload 5. Wt down to 142.7kg from 149.6kg on admit 6. Pt still edematous 4. Polycythemia 1. Likely secondary to tobacco abuse 5. Morbid obesity 1. stable 6. OSA 1. Refuses CPAP 2. States he has CPAP at home, but does not use it because he believes his OSA is the result of GERD 7. HTN 1. Stable thus far 8. DVT prophylaxis 1. Lovenox  Code Status: Full Family Communication: Pt in room Disposition Plan: Pending   Consultants:    Procedures:    Antibiotics:  Doxycycline 11/14>>>  augmentin 11/14>>>   HPI/Subjective: Continues with slow improvement. Eager to go home.  Objective: Filed Vitals:   06/14/14 1416 06/14/14 1606 06/14/14 1607 06/14/14 1705  BP:  150/87  133/82  Pulse:  99 120 92  Temp:  98 F (36.7 C)    TempSrc:  Oral    Resp:  22    Height:      Weight:      SpO2: 92% 92%      Intake/Output Summary (Last 24 hours) at 06/14/14 1717 Last  data filed at 06/14/14 1708  Gross per 24 hour  Intake    840 ml  Output   3981 ml  Net  -3141 ml   Filed Weights   06/12/14 0604 06/13/14 0556 06/14/14 0539  Weight: 144.516 kg (318 lb 9.6 oz) 144.2 kg (317 lb 14.5 oz) 142.702 kg (314 lb 9.6 oz)    Exam:   General:  Awake, in nad  Cardiovascular: regular, s1, s2  Respiratory: normal resp effort, end expiratory wheezing in all lung fields  Abdomen: soft, obese, nondistended  Musculoskeletal: perfused, no clubbing, 2+ pitting LE edema B  Data Reviewed: Basic Metabolic Panel:  Recent Labs Lab 06/10/14 1518 06/11/14 0447 06/12/14 0348 06/13/14 0339 06/14/14 0533  NA 139 145 139 141 144  K 5.1 4.6 4.2 4.4 4.5  CL 98 99 93* 94* 96  CO2 27 33* 39* 36* 38*  GLUCOSE 96 80 136* 188* 124*  BUN 19 20 16 15 14   CREATININE 0.95 1.04 0.85 0.80 0.76  CALCIUM 8.7 8.5 8.7 8.6 9.1   Liver Function Tests:  Recent Labs Lab 06/10/14 1518  AST 57*  ALT 31  ALKPHOS 60  BILITOT 1.2  PROT 6.2  ALBUMIN 3.4*   No results for input(s): LIPASE, AMYLASE in the last 168 hours. No results for input(s): AMMONIA in the last 168 hours. CBC:  Recent Labs Lab 06/10/14 1518 06/11/14 0447  WBC 8.8 10.0  HGB 19.6* 19.3*  HCT 61.7*  63.9*  MCV 85.3 88.4  PLT 121* 124*   Cardiac Enzymes:  Recent Labs Lab 06/10/14 1518  TROPONINI <0.30   BNP (last 3 results)  Recent Labs  06/10/14 1518  PROBNP 2021.0*   CBG: No results for input(s): GLUCAP in the last 168 hours.  No results found for this or any previous visit (from the past 240 hour(s)).   Studies: No results found.  Scheduled Meds: . amoxicillin-clavulanate  1 tablet Oral Q12H  . aspirin EC  81 mg Oral Daily  . carvedilol  3.125 mg Oral BID WC  . enoxaparin (LOVENOX) injection  40 mg Subcutaneous Q24H  . famotidine  40 mg Oral QHS  . furosemide  40 mg Intravenous BID  . ipratropium-albuterol  3 mL Nebulization BID  . methylPREDNISolone (SOLU-MEDROL) injection   60 mg Intravenous Q12H  . nitroGLYCERIN  0.5 inch Topical 4 times per day  . sodium chloride  3 mL Intravenous Q12H  . tiotropium  18 mcg Inhalation Daily   Continuous Infusions:   Principal Problem:   Acute exacerbation of CHF (congestive heart failure) Active Problems:   Hypertension   Sleep apnea   Obesity   Polycythemia   CHF (congestive heart failure)  Time spent: 35min  [facility], [facility] Pager [phone]. If 7PM-7AM, please contact night-coverage at [email or web address], password [facility] 06/14/2014, 5:17 PM  LOS: 4 days

## 2014-06-14 NOTE — Progress Notes (Signed)
RN questions accuracy of intake and output during this shift. Pt has been seen drinking water from faucet inside room. Pt has also began to report his output to staff instead of urinating into urinal/measuring equipment. Will continue to monitor.

## 2014-06-15 LAB — BASIC METABOLIC PANEL
Anion gap: 11 (ref 5–15)
BUN: 16 mg/dL (ref 6–23)
CHLORIDE: 95 meq/L — AB (ref 96–112)
CO2: 38 mEq/L — ABNORMAL HIGH (ref 19–32)
Calcium: 9 mg/dL (ref 8.4–10.5)
Creatinine, Ser: 0.71 mg/dL (ref 0.50–1.35)
GFR calc Af Amer: >90 mL/min (ref >90)
GFR calc non Af Amer: >90 mL/min (ref >90)
GLUCOSE: 136 mg/dL — AB (ref 70–99)
POTASSIUM: 4.7 meq/L (ref 3.7–5.3)
Sodium: 144 mEq/L (ref 137–147)

## 2014-06-15 LAB — CBC WITH DIFFERENTIAL/PLATELET
Basophils Absolute: 0 10*3/uL (ref 0.0–0.1)
Basophils Relative: 0 % (ref 0–1)
EOS PCT: 0 % (ref 0–5)
Eosinophils Absolute: 0 10*3/uL (ref 0.0–0.7)
HCT: 66.7 % — ABNORMAL HIGH (ref 39.0–52.0)
Hemoglobin: 20.9 g/dL — ABNORMAL HIGH (ref 13.0–17.0)
LYMPHS ABS: 0.6 10*3/uL — AB (ref 0.7–4.0)
LYMPHS PCT: 5 % — AB (ref 12–46)
MCH: 26.5 pg (ref 26.0–34.0)
MCHC: 31.3 g/dL (ref 30.0–36.0)
MCV: 84.4 fL (ref 78.0–100.0)
Monocytes Absolute: 0.8 10*3/uL (ref 0.1–1.0)
Monocytes Relative: 6 % (ref 3–12)
NEUTROS PCT: 90 % — AB (ref 43–77)
Neutro Abs: 12.5 10*3/uL — ABNORMAL HIGH (ref 1.7–7.7)
Platelets: 162 10*3/uL (ref 150–400)
RBC: 7.9 MIL/uL — AB (ref 4.22–5.81)
RDW: 20.4 % — ABNORMAL HIGH (ref 11.5–15.5)
WBC: 14 10*3/uL — AB (ref 4.0–10.5)

## 2014-06-15 LAB — ETHANOL

## 2014-06-15 MED ORDER — METHYLPREDNISOLONE SODIUM SUCC 125 MG IJ SOLR
60.0000 mg | INTRAMUSCULAR | Status: DC
  Filled 2014-06-15: qty 0.96

## 2014-06-15 MED ORDER — PREDNISONE 20 MG PO TABS
20.0000 mg | ORAL_TABLET | Freq: Every day | ORAL | Status: DC
  Administered 2014-06-16: 20 mg via ORAL
  Filled 2014-06-15 (×2): qty 1

## 2014-06-15 NOTE — Progress Notes (Signed)
[facility] PROGRESS NOTE  Weldon Nouri Gappa WIO:[id number] DOB: [birth year] DOA: 06/10/2014 PCP: No PCP Per Patient  Off Service Summary 63yo noncompliant male who presents with sob with wheezing and volume overload. 2d echo with mod-severe pulm edema with wheezing. Patient is continued on scheduled lasix and nebs. He initially refused solumedrol however now agrees to it. Thus far, pt is showing improvement, diuresing nicely.  HPI/Subjective: Feels that he is breathing better today and his ankles are much less swollen. Have had an extensive conversation with him discussing all of his medical issues and plan for future management.  Assessment/Plan: 1. Acute respiratory failure 1. Likely secondary to combination of copd with heart failure 2. Scheduled nebs and steroids ordered, as well as lasix 3. Cont to wean o2 as tolerated 2. Copd exacerbation 1. No longer wheezing today- discussed the possible need for home O2 and will reassess him for this tomorrow morning. 2. Noted to be on Augmentin - suspect that this is for acute bronchitis-tomorrow will be day 5 3. On scheduled nebs -will transition from Solu-Medrol to prednisone in the morning 4. Will need outpatient PFTs to be ordered by PCP-he is in agreement with this plan 3. Acutely left and right-sided heart failure- severe pulmonary hypertension 1. On lasix- in negative balance by 16 L so far 2. 2d echo with normal EF but LVH is noted and likely has diastolic heart failure along with moderate pulm htn and right-sided heart failure 4. Polycythemia 1. Likely secondary to tobacco abuse- and chronic hypoxia 2.  hemoglobin is 20-We'll ask for phlebotomy to remove 250 mL of blood today- 5. Morbid obesity 1. stable 6. OSA 1. Refuses CPAP 2. Does not believe that he has sleep apnea any longer and states that he was told by an ENT doctor that the abnormal sleep study was most likely secondary to gastroesophageal reflux disease- he is been  on Zantac as outpatient believes that his sleep apnea has resolved 7. HTN 1. Stable thus far 8. DVT prophylaxis 1. Lovenox  Code Status: Full Family Communication: Pt and wife Disposition Plan: Pending   Antibiotics:  Doxycycline 11/14>>>  augmentin 11/14>>>     Objective: Filed Vitals:   06/15/14 0538 06/15/14 0847 06/15/14 0851 06/15/14 1341  BP: 121/71  133/87 121/84  Pulse: 100  97 103  Temp: 97.9 F (36.6 C)   99 F (37.2 C)  TempSrc: Oral   Oral  Resp: 18   22  Height:      Weight: 141.069 kg (311 lb)     SpO2: 82% 92%  91%    Intake/Output Summary (Last 24 hours) at 06/15/14 1655 Last data filed at 06/15/14 1258  Gross per 24 hour  Intake    940 ml  Output   5071 ml  Net  -4131 ml   Filed Weights   06/13/14 0556 06/14/14 0539 06/15/14 0538  Weight: 144.2 kg (317 lb 14.5 oz) 142.702 kg (314 lb 9.6 oz) 141.069 kg (311 lb)    Exam:   General:  Awake, in nad  Cardiovascular: regular, s1, s2  Respiratory: normal resp effort, clear to auscultation bilaterally  Abdomen: soft, obese, nondistended  Musculoskeletal: perfused, no clubbing, no further edema noted  Data Reviewed: Basic Metabolic Panel:  Recent Labs Lab 06/11/14 0447 06/12/14 0348 06/13/14 0339 06/14/14 0533 06/15/14 0538  NA 145 139 141 144 144  K 4.6 4.2 4.4 4.5 4.7  CL 99 93* 94* 96 95*  CO2 33* 39* 36* 38* 38*  GLUCOSE  80 136* 188* 124* 136*  BUN 20 16 15 14 16   CREATININE 1.04 0.85 0.80 0.76 0.71  CALCIUM 8.5 8.7 8.6 9.1 9.0   Liver Function Tests:  Recent Labs Lab 06/10/14 1518  AST 57*  ALT 31  ALKPHOS 60  BILITOT 1.2  PROT 6.2  ALBUMIN 3.4*   No results for input(s): LIPASE, AMYLASE in the last 168 hours. No results for input(s): AMMONIA in the last 168 hours. CBC:  Recent Labs Lab 06/10/14 1518 06/11/14 0447 06/15/14 1048  WBC 8.8 10.0 14.0*  NEUTROABS  --   --  12.5*  HGB 19.6* 19.3* 20.9*  HCT 61.7* 63.9* 66.7*  MCV 85.3 88.4 84.4  PLT 121*  124* 162   Cardiac Enzymes:  Recent Labs Lab 06/10/14 1518  TROPONINI <0.30   BNP (last 3 results)  Recent Labs  06/10/14 1518  PROBNP 2021.0*   CBG: No results for input(s): GLUCAP in the last 168 hours.  No results found for this or any previous visit (from the past 240 hour(s)).   Studies: No results found.  Scheduled Meds: . amoxicillin-clavulanate  1 tablet Oral Q12H  . aspirin EC  81 mg Oral Daily  . carvedilol  3.125 mg Oral BID WC  . enoxaparin (LOVENOX) injection  40 mg Subcutaneous Q24H  . famotidine  40 mg Oral QHS  . furosemide  40 mg Intravenous BID  . [START ON 06/16/2014] predniSONE  20 mg Oral Q breakfast  . sodium chloride  3 mL Intravenous Q12H  . tiotropium  18 mcg Inhalation Daily   Continuous Infusions:    Time spent: 39min  [place]  [facility]  If 7PM-7AM, please contact night-coverage at [email or web address], password [facility] 06/15/2014, 4:55 PM  LOS: 5 days

## 2014-06-15 NOTE — Progress Notes (Signed)
Therapeutic phlebotomy done, VS pre and post stable. Pressure dressing applied on site. No bleeding noted, hemostasis achieved. Pt is hemodynamically stable at this time. Will monitor.

## 2014-06-15 NOTE — Progress Notes (Signed)
After speaking with both pt and physician, contacted IV team so that they may perform the order placed stating to "perform therapeutic phlebotomy,"  The pt and physician both state that 421mL (1 pint) of blood should be removed to attempt to correct pt's hemoglobin of 20.9.

## 2014-06-16 DIAGNOSIS — J9621 Acute and chronic respiratory failure with hypoxia: Secondary | ICD-10-CM

## 2014-06-16 DIAGNOSIS — J441 Chronic obstructive pulmonary disease with (acute) exacerbation: Secondary | ICD-10-CM

## 2014-06-16 DIAGNOSIS — J9620 Acute and chronic respiratory failure, unspecified whether with hypoxia or hypercapnia: Secondary | ICD-10-CM

## 2014-06-16 LAB — CBC
HEMATOCRIT: 66.9 % — AB (ref 39.0–52.0)
HEMOGLOBIN: 20.7 g/dL — AB (ref 13.0–17.0)
MCH: 26.2 pg (ref 26.0–34.0)
MCHC: 30.9 g/dL (ref 30.0–36.0)
MCV: 84.6 fL (ref 78.0–100.0)
Platelets: 155 10*3/uL (ref 150–400)
RBC: 7.91 MIL/uL — ABNORMAL HIGH (ref 4.22–5.81)
RDW: 20.7 % — ABNORMAL HIGH (ref 11.5–15.5)
WBC: 13.4 10*3/uL — AB (ref 4.0–10.5)

## 2014-06-16 LAB — BASIC METABOLIC PANEL
Anion gap: 17 — ABNORMAL HIGH (ref 5–15)
BUN: 20 mg/dL (ref 6–23)
CO2: 35 mEq/L — ABNORMAL HIGH (ref 19–32)
Calcium: 9 mg/dL (ref 8.4–10.5)
Chloride: 89 mEq/L — ABNORMAL LOW (ref 96–112)
Creatinine, Ser: 0.86 mg/dL (ref 0.50–1.35)
GFR calc Af Amer: >90 mL/min (ref >90)
GFR calc non Af Amer: >90 mL/min (ref >90)
GLUCOSE: 188 mg/dL — AB (ref 70–99)
POTASSIUM: 5 meq/L (ref 3.7–5.3)
Sodium: 141 mEq/L (ref 137–147)

## 2014-06-16 MED ORDER — PREDNISONE 20 MG PO TABS: 10.0000 mg | ORAL_TABLET | Freq: Every day | ORAL | Status: DC

## 2014-06-16 MED ORDER — CARVEDILOL 3.125 MG PO TABS: 3.1250 mg | ORAL_TABLET | Freq: Two times a day (BID) | ORAL | Status: DC

## 2014-06-16 MED ORDER — ALBUTEROL SULFATE HFA 108 (90 BASE) MCG/ACT IN AERS: 2.0000 | INHALATION_SPRAY | Freq: Four times a day (QID) | RESPIRATORY_TRACT | Status: DC | PRN

## 2014-06-16 MED ORDER — FUROSEMIDE 40 MG PO TABS: 40.0000 mg | ORAL_TABLET | Freq: Every day | ORAL | Status: DC

## 2014-06-16 MED ORDER — TIOTROPIUM BROMIDE MONOHYDRATE 18 MCG IN CAPS: 18.0000 ug | ORAL_CAPSULE | Freq: Every day | RESPIRATORY_TRACT | Status: DC

## 2014-06-16 NOTE — Plan of Care (Signed)
Problem: Phase I Progression Outcomes Goal: Dyspnea controlled at rest (HF) Outcome: Completed/Met Date Met:  06/16/14 Goal: Other Phase I Outcomes/Goals Outcome: Not Applicable Date Met:  44/92/52

## 2014-06-16 NOTE — Progress Notes (Signed)
SATURATION QUALIFICATIONS: (This note is used to comply with regulatory documentation for home oxygen)  Patient Saturations on Room Air at Rest = 90%  Patient Saturations on Room Air while Ambulating = 85%  Patient Saturations on 2 Liters of oxygen while Ambulating = 90%  Please briefly explain why patient needs home oxygen: 

## 2014-06-16 NOTE — Progress Notes (Signed)
Patient and wife given discharged instructions.  All questions answered.

## 2014-06-16 NOTE — Discharge Summary (Addendum)
Physician Discharge Summary  Philip Powell ZMO:[id number] DOB: 1951-06-25 DOA: 06/10/2014  PCP: Octavio Graves, DO  Admit date: 06/10/2014 Discharge date: 06/16/2014  Time spent:>45 minutes  Recommendations for Outpatient Follow-up:  1. Will need PFTs and a repeat sleep study 2. 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 On initial exam he was found to have wheezing and admitted to a dry cough. He was admitted for CHF and a COPD exacerbation.  Hospital Course:  1. Acute respiratory failure - Likely secondary to combination of copd with congestive heart failure  2. Suspected COPD exacerbation. -Initially was refusing to take Solu-Medrol. However as he continued to wheeze and be hypoxic despite diuresis, he eventually decided that he should start taking at. Improvement in respiratory status was noted with resolution of wheezing and improvement in dyspnea. - He states that he is never had pulmonary function tests in the past but is a lifelong smoker-has been advised to follow-up with his PCP in 1 week and have outpatient PFTs performed -He is going to be discharged home on 2 L of oxygen as oxygen saturations dropped to 85% upon exertion  3. Acutely left and right-sided heart failure- severe pulmonary hypertension -We have diuresed him aggressively and  he is now on negative balance by close to 20 L and has achieved a dry weight of 139.5 kg-weight on admission was 149 kg. - 2d echo with normal EF but LVH is noted and therefore, he likely has diastolic heart failure -this being said it appears that he did not have much pulmonary edema on the chest x-ray on admission - I think the bigger concern is the fact that he has moderate pulm htn and right-sided heart failure which is likely secondary to obstructive sleep apnea and COPD-discussed echo findings with patient in detail and explained the need for Lasix at this time  4. Polycythemia - Likely secondary to tobacco abuse- and chronic hypoxia       -hemoglobin noted to be 20-phlebotomy performed to remove 250 mL of blood on 11/18-hemoglobin still at 20 today - I suspect that his CBC has not had a chance to equilibrate yet  5. Suspected OSA - Refuses CPAP in the hospital -Apparently he's had a positive sleep study in the past which was about 2 years ago- he does not believe that he has sleep apnea any longer - states that he was told by an ENT doctor that the abnormal sleep study was most likely secondary to gastroesophageal reflux disease- he is been on Zantac as outpatient and believes that his sleep apnea has resolved -I am still highly suspicious that he may have sleep apnea and would recommend a repeat sleep study  6. HTN Stable on current medications   Consultations:  None  Discharge Exam: Filed Weights   06/14/14 0539 06/15/14 0538 06/16/14 0430  Weight: 142.702 kg (314 lb 9.6 oz)  141.069 kg (311 lb) 139.572 kg (307 lb 11.2 oz)   Filed Vitals:   06/16/14 0430  BP: 137/87  Pulse: 97  Temp: 97.5 F (36.4 C)  Resp: 20    General: AAO x 3, no distress Cardiovascular: RRR, no murmurs  Respiratory: clear to auscultation bilaterally GI: soft, non-tender, non-distended, bowel sound positive Extremities: No cyanosis clubbing or edema  Discharge Instructions You were cared for by a  hospitalist during your hospital stay. If you have any questions about your discharge medications or the care you received while you were in the hospital after you are discharged, you can call the unit and asked to speak with the hospitalist on call if the hospitalist that took care of you is not available. Once you are discharged, your primary care physician will handle any further medical issues. Please note that NO REFILLS for any discharge medications will be authorized once you are discharged, as it is imperative that you return to your primary care physician (or establish a relationship with a primary care physician if you do not have one) for your aftercare needs so that they can reassess your need for medications and monitor your lab values.      Discharge Instructions    Diet - low sodium heart healthy    Complete by:  As directed      Discharge instructions    Complete by:  As directed   Please follow-up with your family doctor in 1 week You will need pulmonary function tests and most likely another sleep study     Increase activity slowly    Complete by:  As directed             Medication List    STOP taking these medications        ibuprofen 200 MG tablet  Commonly known as:  ADVIL,MOTRIN      TAKE these medications        albuterol 108 (90 BASE) MCG/ACT inhaler  Commonly known as:  PROVENTIL HFA;VENTOLIN HFA  Inhale 2 puffs into the lungs every 6 (six) hours as needed for wheezing or shortness of breath.     carvedilol 3.125 MG tablet  Commonly known as:  COREG  Take 1 tablet (3.125 mg total) by mouth 2 (two) times daily with a meal.     CENTRUM ADULTS Tabs  Take 1 tablet by mouth daily.     furosemide 40 MG tablet  Commonly known as:  LASIX  Take 1 tablet (40 mg total) by mouth daily.     HYDROcodone-acetaminophen 7.5-325 MG per tablet  Commonly known as:  NORCO  Take 1 tablet by mouth every 6 (six) hours.     predniSONE 20 MG tablet  Commonly known as:   DELTASONE  Take 0.5 tablets (10 mg total) by mouth daily with breakfast.     ranitidine 150 MG tablet  Commonly known as:  ZANTAC  Take 150 mg by mouth 2 (two) times daily.     tiotropium 18 MCG inhalation capsule  Commonly known as:  SPIRIVA  Place 1 capsule (18 mcg total) into inhaler and inhale daily.       No Known Allergies Follow-up Information    Follow up with Terald Sleeper, PA-C On 06/21/2014.   Specialty:  General Practice   Why:  9:30AM; Dr. Melina Copa is on family medical leave.     Contact information:   [address]  [address] [phone]  The results of significant diagnostics from this hospitalization (including imaging, microbiology, ancillary and laboratory) are listed below for reference.    Significant Diagnostic Studies: Dg Chest 2 View  06/10/2014   CLINICAL DATA:  Shortness of breath, lower extremity edema the began 24 hr ago, smoker, hypertension  EXAM: CHEST  2 VIEW  COMPARISON:  Exam at 1534 hr compared to earlier exam of 06/10/2014 at 1255 hr  FINDINGS: Enlargement of cardiac silhouette with pulmonary vascular congestion.  Tortuous aorta.  Accentuation of perihilar interstitial markings most consistent with mild pulmonary edema.  No segmental consolidation, pleural effusion or pneumothorax.  Bones unremarkable.  IMPRESSION: Probable mild CHF.   Electronically Signed   By: Lavonia Dana M.D.   On: 06/10/2014 15:47    Microbiology: No results found for this or any previous visit (from the past 240 hour(s)).   Labs: Basic Metabolic Panel:  Recent Labs Lab 06/12/14 0348 06/13/14 0339 06/14/14 0533 06/15/14 0538 06/16/14 0905  NA 139 141 144 144 141  K 4.2 4.4 4.5 4.7 5.0  CL 93* 94* 96 95* 89*  CO2 39* 36* 38* 38* 35*  GLUCOSE 136* 188* 124* 136* 188*  BUN 16 15 14 16 20   CREATININE 0.85 0.80 0.76 0.71 0.86  CALCIUM 8.7 8.6 9.1 9.0 9.0   Liver Function Tests:  Recent Labs Lab 06/10/14 1518  AST 57*  ALT 31  ALKPHOS  60  BILITOT 1.2  PROT 6.2  ALBUMIN 3.4*   No results for input(s): LIPASE, AMYLASE in the last 168 hours. No results for input(s): AMMONIA in the last 168 hours. CBC:  Recent Labs Lab 06/10/14 1518 06/11/14 0447 06/15/14 1048 06/16/14 0600  WBC 8.8 10.0 14.0* 13.4*  NEUTROABS  --   --  12.5*  --   HGB 19.6* 19.3* 20.9* 20.7*  HCT 61.7* 63.9* 66.7* 66.9*  MCV 85.3 88.4 84.4 84.6  PLT 121* 124* 162 155   Cardiac Enzymes:  Recent Labs Lab 06/10/14 1518  TROPONINI <0.30   BNP: BNP (last 3 results)  Recent Labs  06/10/14 1518  PROBNP 2021.0*   CBG: No results for input(s): GLUCAP in the last 168 hours.     SignedDebbe Odea, MD [facility] 06/16/2014, 11:31 AM

## 2014-06-24 ENCOUNTER — Encounter: Payer: Self-pay | Admitting: *Deleted

## 2014-06-24 ENCOUNTER — Other Ambulatory Visit (INDEPENDENT_AMBULATORY_CARE_PROVIDER_SITE_OTHER): Payer: Managed Care, Other (non HMO)

## 2014-06-24 ENCOUNTER — Ambulatory Visit (INDEPENDENT_AMBULATORY_CARE_PROVIDER_SITE_OTHER): Payer: Managed Care, Other (non HMO) | Admitting: Internal Medicine

## 2014-06-24 ENCOUNTER — Encounter: Payer: Self-pay | Admitting: Internal Medicine

## 2014-06-24 VITALS — BP 112/80 | HR 100 | Temp 98.5°F | Ht 72.8 in | Wt 322.0 lb

## 2014-06-24 DIAGNOSIS — R0602 Shortness of breath: Secondary | ICD-10-CM

## 2014-06-24 DIAGNOSIS — I2781 Cor pulmonale (chronic): Secondary | ICD-10-CM

## 2014-06-24 DIAGNOSIS — D751 Secondary polycythemia: Secondary | ICD-10-CM

## 2014-06-24 DIAGNOSIS — J9612 Chronic respiratory failure with hypercapnia: Secondary | ICD-10-CM

## 2014-06-24 LAB — CBC WITH DIFFERENTIAL/PLATELET
Basophils Absolute: 0.1 10*3/uL (ref 0.0–0.1)
Basophils Relative: 0.7 % (ref 0.0–3.0)
EOS ABS: 0.2 10*3/uL (ref 0.0–0.7)
EOS PCT: 2 % (ref 0.0–5.0)
HCT: 66.8 % (ref 39.0–52.0)
Hemoglobin: 20.2 g/dL (ref 13.0–17.0)
LYMPHS PCT: 13.5 % (ref 12.0–46.0)
Lymphs Abs: 1.6 10*3/uL (ref 0.7–4.0)
MCHC: 30.3 g/dL (ref 30.0–36.0)
MCV: 83.7 fl (ref 78.0–100.0)
MONO ABS: 1.3 10*3/uL — AB (ref 0.1–1.0)
Monocytes Relative: 10.5 % (ref 3.0–12.0)
NEUTROS PCT: 73.3 % (ref 43.0–77.0)
Neutro Abs: 8.7 10*3/uL — ABNORMAL HIGH (ref 1.4–7.7)
Platelets: 152 10*3/uL (ref 150.0–400.0)
RBC: 7.99 Mil/uL — ABNORMAL HIGH (ref 4.22–5.81)
RDW: 21.8 % — ABNORMAL HIGH (ref 11.5–15.5)
WBC: 11.9 10*3/uL — AB (ref 4.0–10.5)

## 2014-06-24 LAB — BASIC METABOLIC PANEL
BUN: 15 mg/dL (ref 6–23)
CO2: 34 mEq/L — ABNORMAL HIGH (ref 19–32)
CREATININE: 1 mg/dL (ref 0.4–1.5)
Calcium: 8.8 mg/dL (ref 8.4–10.5)
Chloride: 97 mEq/L (ref 96–112)
GFR: 85.6 mL/min (ref >60.00)
Glucose, Bld: 93 mg/dL (ref 70–99)
Potassium: 4.8 mEq/L (ref 3.5–5.1)
Sodium: 140 mEq/L (ref 135–145)

## 2014-06-24 LAB — TSH: TSH: 1.82 u[IU]/mL (ref 0.35–4.50)

## 2014-06-24 LAB — BRAIN NATRIURETIC PEPTIDE: PRO B NATRI PEPTIDE: 259 pg/mL — AB (ref 0.0–100.0)

## 2014-06-24 NOTE — Progress Notes (Signed)
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 On initial exam he was found to have wheezing and admitted to a dry cough. He was admitted for CHF and a COPD exacerbation.  Hospital Course:  1. Acute respiratory failure - Likely secondary to combination of copd with congestive heart failure  2. Suspected COPD exacerbation. -Initially was refusing to take Solu-Medrol. However as he continued to wheeze and be hypoxic despite diuresis, he eventually decided that he should start taking at. Improvement in respiratory status was noted with resolution of wheezing and improvement in dyspnea. - He states that he is never had pulmonary function tests in the past but is a lifelong smoker-has been advised to follow-up with his PCP in 1 week and have outpatient PFTs performed -He is going to be discharged home on 2 L of oxygen as oxygen saturations dropped to 85% upon exertion  3. Acutely left and right-sided heart failure- severe pulmonary hypertension -We have diuresed him aggressively and he is now on  negative balance by close to 20 L and has achieved a dry weight of 139.5 kg-weight on admission was 149 kg. - 2d echo with normal EF but LVH is noted and therefore, he likely has diastolic heart failure -this being said it appears that he did not have much pulmonary edema on the chest x-ray on admission - I think the bigger concern is the fact that he has moderate pulm htn and right-sided heart failure which is likely secondary to obstructive sleep apnea and COPD-discussed echo findings with patient in detail and explained the need for Lasix at this time  4. Polycythemia - Likely secondary to tobacco abuse- and chronic hypoxia  -hemoglobin noted to be 20-phlebotomy performed to remove 250 mL of blood on 11/18-hemoglobin still at 20 today - I suspect that his CBC has not had a chance to equilibrate yet  5. Suspected OSA - Refuses CPAP in the hospital -Apparently he's had a positive sleep study in the past which was about 2 years ago- he does not believe that he has sleep apnea any longer - states that he was told by an ENT doctor that the abnormal sleep study was most likely secondary to gastroesophageal reflux disease- he is been on Zantac as outpatient and believes that his sleep apnea has resolved -I am still highly suspicious that he may have sleep apnea and would recommend a repeat sleep study  6. HTN Stable on current medications   06/24/2014 1st [facility] office visit/ Philip Powell  On 02 most of the day and with exertion but not hs / home monitor sats bad with ex Chief  Complaint  Patient presents with  . Advice Only    pt c/o sob he is on 2L/M 02. He was admitted to [facility] x6 days and is here to follow up. He has minimal cough but denies wheezing and chest tightness.  has not tried titrating 02 with activity limited to only  30 ft   No obvious day to day or daytime variabilty or assoc excess or purulent sputum or cp or chest tightness, subjective wheeze overt sinus or hb  symptoms. No unusual exp hx or h/o childhood pna/ asthma or knowledge of premature birth.  Sleeping ok without nocturnal  or early am exacerbation  of respiratory  c/o's or need for noct saba. Also denies any obvious fluctuation of symptoms with weather or environmental changes or other aggravating or alleviating factors except as outlined above   Current Medications, Allergies, Complete Past Medical History, Past Surgical History, Family History, and Social History were reviewed in [employer] record.  ROS  The following are not active complaints unless bolded sore throat, dysphagia, dental problems, itching, sneezing,  nasal congestion or excess/ purulent secretions, ear ache,   fever, chills, sweats, unintended wt loss, pleuritic or exertional cp, hemoptysis,  orthopnea pnd or leg swelling, presyncope, palpitations, heartburn, abdominal pain, anorexia, nausea, vomiting, diarrhea  or change in bowel or urinary habits, change in stools or urine, dysuria,hematuria,  rash, arthralgias, visual complaints, headache, numbness weakness or ataxia or problems with walking or coordination,  change in mood/affect or memory.          Review of Systems     Objective:   Physical Exam    amb obese wm with gruff sounding voice/ cough nad on 2lpm   Wt Readings from Last 3 Encounters:  06/24/14 322 lb (146.058 kg)  06/16/14 307 lb 11.2 oz (139.572 kg)    Vital signs reviewed   HEENT: nl dentition, turbinates, and orophanx. Nl external ear canals without cough reflex   NECK :  without JVD/Nodes/TM/ nl carotid upstrokes bilaterally   LUNGS: no acc muscle use, clear to A and P bilaterally without cough on insp or exp maneuvers   CV:  RRR  no s3 or murmur or increase in P2, 1 + lower ext pitting with venous stasis changes R > L  ABD:  soft and nontender with nl excursion in the supine position. No bruits or organomegaly, bowel sounds nl  MS:  warm without deformities, calf  tenderness, cyanosis or clubbing  SKIN: warm and dry without lesions    NEURO:  alert, approp, no deficits   cxr 06/10/14 Probable mild CHF.   Recent Labs Lab 06/24/14 1152  NA 140  K 4.8  CL 97  CO2 34*  BUN 15  CREATININE 1.0  GLUCOSE 93    Recent Labs Lab 06/24/14 1152  HGB 20.2 cH*  HCT 66.8 aH*  WBC 11.9*  PLT 152.0     Lab Results  Component Value Date   TSH 1.82 06/24/2014     Lab Results  Component Value Date   PROBNP 259.0* 06/24/2014            Assessment:

## 2014-06-24 NOTE — Patient Instructions (Addendum)
For now wear 02 at 2lpm at much as possible and 3lpm with exertion and work on weight loss  Weight control is simply a matter of calorie balance which needs to be tilted in your favor by eating less and exercising more.  To get the most out of exercise, you need to be continuously aware that you are short of breath, but never out of breath, for 30 minutes daily. As you improve, it will actually be easier for you to do the same amount of exercise  in  30 minutes so always push to the level where you are short of breath.  If this does not result in gradual weight reduction then I strongly recommend you see a nutritionist with a food diary x 2 weeks so that we can work out a negative calorie balance which is universally effective in steady weight loss programs.  Think of your calorie balance like you do your bank account where in this case you want the balance to go down so you must take in less calories than you burn up.  It's just that simple:  Hard to do, but easy to understand.  Good luck!   The key is to stop smoking completely before smoking completely stops you!   Continue zantac after bfast and supper for now  GERD (REFLUX)  is an extremely common cause of respiratory symptoms just like yours , many times with no obvious heartburn at all.    It can be treated with medication, but also with lifestyle changes including avoidance of late meals, excessive alcohol, smoking cessation, and avoid fatty foods, chocolate, peppermint, colas, red wine, and acidic juices such as orange juice.  NO MINT OR MENTHOL PRODUCTS SO NO COUGH DROPS  USE SUGARLESS CANDY INSTEAD (Jolley ranchers or Stover's or Life Savers) or even ice chips will also do - the key is to swallow to prevent all throat clearing. NO OIL BASED VITAMINS - use powdered substitutes.    Please schedule a follow up office visit in 4 weeks, call sooner if needed  Late add:  D/c spiriva/ needs v/q and venous dopplers for dx of cor pulmonale   Will need additional phlebotomy x 2 units but this should correct once he's on adequate 24h 02  Needs also to take asa 325 mg daily

## 2014-06-25 DIAGNOSIS — I2781 Cor pulmonale (chronic): Secondary | ICD-10-CM | POA: Insufficient documentation

## 2014-06-25 DIAGNOSIS — J9611 Chronic respiratory failure with hypoxia: Secondary | ICD-10-CM | POA: Insufficient documentation

## 2014-06-25 DIAGNOSIS — J9612 Chronic respiratory failure with hypercapnia: Principal | ICD-10-CM

## 2014-06-25 NOTE — Assessment & Plan Note (Addendum)
Spirometry 06/24/2014  VC 50% s sign obstruction   So despite smoking hx he does not have sign copd. Main problem is obesity/ reviewed with pt and wife  Ok to d/c spiriva

## 2014-06-25 NOTE — Assessment & Plan Note (Signed)
See Echo  06/11/14  moderate LVH. Systolic function was vigorous. The estimated ejection fraction was in the range of 65% to 70%. Wall motion was normal; there were no regional wall motion abnormalities. - Ventricular septum: The contour showed diastolic flattening and systolic flattening. - Right ventricle: The cavity size was moderately dilated. Systolic function was mildly to moderately reduced. - Right atrium: The atrium was moderately dilated. - Pulmonary arteries: Systolic pressure was moderately increased. PA peak pressure: 63 mm Hg   Concerned about CTEPAH in this setting but likely this is all related to OHS > will  rec V/Q and venous dopplers to be complete

## 2014-06-25 NOTE — Assessment & Plan Note (Signed)
-   06/25/2014  Walked 3lpm  x 3 laps @ 185 ft each stopped due to  End of study, no desats or sob/ nl pace  By spirometry today and hx this is almost entirely OHS related > rx is 02 and wt loss/ discussed.  Not interested in pursuing sleep w/u for now/ has refused cpap in past

## 2014-06-25 NOTE — Assessment & Plan Note (Addendum)
CBC Latest Ref Rng 06/24/2014 06/16/2014 06/15/2014  WBC 4.0 - 10.5 K/uL 11.9(H) 13.4(H) 14.0(H)  Hemoglobin 13.0 - 17.0 g/dL 20.2 cH(HH) 20.7(H) 20.9(H)  Hematocrit 39.0 - 52.0 % 66.8 aH(HH) 66.9(H) 66.7(H)  Platelets 150.0 - 400.0 K/uL 152.0 155 162     Will need additional phlebotomy x 2 units but this should correct once he's on adequate 24h 02   Needs also to take asa 325 mg daily

## 2014-06-27 ENCOUNTER — Telehealth: Payer: Self-pay | Admitting: *Deleted

## 2014-06-27 DIAGNOSIS — I2781 Cor pulmonale (chronic): Secondary | ICD-10-CM

## 2014-06-27 NOTE — Telephone Encounter (Signed)
Pt called back and he is aware of MW recs.   Nothing further is needed.

## 2014-06-27 NOTE — Telephone Encounter (Signed)
Will need additional phlebotomy x 2 units but this should correct once he's on adequate 24h 02      Needs also to take asa 325 mg daily       LMTCB for the pt

## 2014-06-27 NOTE — Telephone Encounter (Signed)
-----   Message from Tanda Rockers, MD sent at 06/25/2014  7:03 AM EST ----- Tell pt p chart review i rec D/c spiriva/ needs v/q and venous dopplers for dx of cor pulmonale

## 2014-06-27 NOTE — Telephone Encounter (Signed)
I spoke with the pt and notified of all of the recs  He verbalized understanding He did not want to schedule venous dopplers or VQ scan at this time  He states that he will think about this and call us back  Will forward to MW so that he is aware

## 2014-06-27 NOTE — Progress Notes (Signed)
Quick Note:  LMTCB ______ 

## 2014-06-28 ENCOUNTER — Other Ambulatory Visit: Payer: Self-pay | Admitting: Internal Medicine

## 2014-06-28 DIAGNOSIS — I2781 Cor pulmonale (chronic): Secondary | ICD-10-CM

## 2014-06-28 NOTE — Telephone Encounter (Signed)
Pt called back and states he is ok with scheduling the VQ scan .  Order placed.  Will forward to MW as an Pharmacist, hospital

## 2014-06-28 NOTE — Addendum Note (Signed)
Addended by: Doroteo Glassman D on: 06/28/2014 11:11 AM   Modules accepted: Orders

## 2014-07-05 ENCOUNTER — Encounter: Payer: Self-pay | Admitting: Cardiology

## 2014-07-05 ENCOUNTER — Ambulatory Visit (INDEPENDENT_AMBULATORY_CARE_PROVIDER_SITE_OTHER): Payer: Managed Care, Other (non HMO) | Admitting: Cardiology

## 2014-07-05 ENCOUNTER — Telehealth: Payer: Self-pay | Admitting: Internal Medicine

## 2014-07-05 VITALS — BP 128/78 | HR 102 | Ht 72.0 in | Wt 319.0 lb

## 2014-07-05 DIAGNOSIS — E669 Obesity, unspecified: Secondary | ICD-10-CM

## 2014-07-05 DIAGNOSIS — D751 Secondary polycythemia: Secondary | ICD-10-CM

## 2014-07-05 DIAGNOSIS — I10 Essential (primary) hypertension: Secondary | ICD-10-CM

## 2014-07-05 DIAGNOSIS — G4730 Sleep apnea, unspecified: Secondary | ICD-10-CM

## 2014-07-05 DIAGNOSIS — I2781 Cor pulmonale (chronic): Secondary | ICD-10-CM

## 2014-07-05 DIAGNOSIS — J9612 Chronic respiratory failure with hypercapnia: Secondary | ICD-10-CM

## 2014-07-05 NOTE — Telephone Encounter (Signed)
Spoke with the pt  He is asking to have "2 phlembotomies" scheduled  He also states that at his last ov, he may have given MW the impression that he was not interested in CPAP  He states that he has decided that he does want to start on CPAP  He had sleep study "a few yrs ago" but never started CPAP although this was recommended  Please advise, thanks!

## 2014-07-05 NOTE — Assessment & Plan Note (Addendum)
He does have LVH consistent with systemic hypertension, although blood pressure is recently well controlled and no additional medication changes were made at this time. Should keep follow-up with Dr. Melina Copa. ARB might be considered as a next addition.

## 2014-07-05 NOTE — Assessment & Plan Note (Signed)
Weight loss would be beneficial.

## 2014-07-05 NOTE — Patient Instructions (Signed)
Your physician recommends that you schedule a follow-up appointment as needed.  Your physician recommends that you continue on your current medications as directed. Please refer to the Current Medication list given to you today.   Thank you for choosing [facility]!!   

## 2014-07-05 NOTE — Assessment & Plan Note (Signed)
Presumably secondary, has history of requiring phlebotomies.

## 2014-07-05 NOTE — Telephone Encounter (Signed)
Ok to order split night study Ok to phebotomize x 2 units (done at hosp ? Short stay?) - you could call heme/onc clinic and see how they arrange it

## 2014-07-05 NOTE — Progress Notes (Signed)
Reason for visit: Cor pulmonale with pulmonary hypertension, LVH and presumed diastolic heart failure  Clinical Summary Philip Powell is a 63 y.o.male referred for cardiology consultation by Dr. Melina Copa. Record review finds hospitalization in November with hypoxic respiratory failure in a setting of possible COPD, OSA, and cor pulmonale with significant volume overload. He was diuresed, and echocardiogram obtained as reported below. Subsequently had evaluation by Dr. Melvyn Novas - his note was reviewed.  Echocardiogram from November reported moderate LVH with LVEF 45-62%, diastolic function not described, evidence of RV volume and pressure overload, moderately dilated RV with mild to moderate reduction in contraction, moderate right atrial enlargement, PASP 63 mmHg. I personally reviewed the images, RV is severely dilated with moderately to severely reduced contraction, LV systolic function vigorous with evidence of grade 1 diastolic dysfunction.  Lab work from November showed potassium 4.8, BUN 15, creatinine 1.0, hemoglobin 20.2, platelets 152.  He is here with his wife today. 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 He states that he is wearing oxygen most hours during the day, sometimes finds that he has taken it off without realizing it at nighttime. His wife confirms that he has snoring and prolonged apnea episodes. He reports prior workup for OSA approximally 3 years ago, declined using CPAP at that time. He states that systems have been slowly progressing over the last few years in terms of shortness of breath and weight gain. Also has history of requiring phlebotomies due to polycythemia.  He has pending chest x-ray with ventilation/perfusion lung scan ordered by Dr. Melvyn Novas to evaluate for thromboembolic disease in light of pulmonary hypertension. He has not yet agreed to a follow-up sleep study. Today I summarized the testing done so for, reviewed  echocardiogram results with the patient and his wife. At this point seems like the most likely culprit here is obstructive sleep apnea with hypoventilation and hypoxia resulting in pulmonary hypertension and cor pulmonale, suspect secondary polycythemia. It does not look like the patient's degree of diastolic dysfunction (grade 1) would be a direct contributor to this degree of pulmonary hypertension, and he does not have any significant left-sided valvular disease to explain this.  No Known Allergies  Current Outpatient Prescriptions  Medication Sig Dispense Refill  . albuterol (PROVENTIL HFA;VENTOLIN HFA) 108 (90 BASE) MCG/ACT inhaler Inhale 2 puffs into the lungs every 6 (six) hours as needed for wheezing or shortness of breath. 1 Inhaler 2  . carvedilol (COREG) 3.125 MG tablet Take 1 tablet (3.125 mg total) by mouth 2 (two) times daily with a meal. 60 tablet 0  . furosemide (LASIX) 40 MG tablet Take 1 tablet (40 mg total) by mouth daily. 30 tablet 0  . HYDROcodone-acetaminophen (NORCO) 7.5-325 MG per tablet Take 1 tablet by mouth every 6 (six) hours.    . Multiple Vitamins-Minerals (CENTRUM ADULTS) TABS Take 1 tablet by mouth daily.    . ranitidine (ZANTAC) 150 MG tablet Take 150 mg by mouth 2 (two) times daily.    Marland Kitchen testosterone cypionate (DEPOTESTOTERONE CYPIONATE) 100 MG/ML injection Inject into the muscle every 14 (fourteen) days. For IM use only     No current facility-administered medications for this visit.    Past Medical History  Diagnosis Date  . Essential hypertension   . COPD (chronic obstructive pulmonary disease)   . Polycythemia   . Obstructive sleep apnea   . Obesity   . History of pneumonia   . Cor pulmonale, chronic  Past Surgical History  Procedure Laterality Date  . Carpal tunnel release  2008    History reviewed. No pertinent family history.  Social History Philip Powell reports that he has been smoking Cigarettes.  He has a 43 pack-year smoking  history. He has never used smokeless tobacco. Philip Powell reports that he drinks alcohol.  Review of Systems Complete review of systems negative except as otherwise outlined in the clinical summary and also the following. No palpitations or chest pain. Chronically fatigued. Snoring and apnea spells confirmed by wife. Reports fluctuating weights but no upward trend with any consistent pattern on Lasix.  Physical Examination Filed Vitals:   07/05/14 1256  BP: 128/78  Pulse: 102   Filed Weights   07/05/14 1256  Weight: 319 lb (144.697 kg)   Morbidly obese male, no distress at rest on oxygen via nasal cannula. HEENT: Conjunctiva and lids normal, oropharynx clear with moist mucosa. Neck: Supple, increased girth, difficult to assess JVP, no carotid bruits, no thyromegaly. Lungs: Diminished breath sounds throughout, no wheezing, nonlabored breathing at rest. Cardiac: Indistinct PMI, distant, RRR, no S3 or significant systolic murmur, no pericardial rub. Abdomen: Obese, nontender, bowel sounds present, no guarding or rebound. Extremities: Chronic appearing edema and stasis with hemosiderin deposition, distal pulses 1-2+. Skin: Warm and dry. Musculoskeletal: No kyphosis. Neuropsychiatric: Alert and oriented x3, affect grossly appropriate.   Problem List and Plan   Cor pulmonale, chronic I reviewed the echocardiogram images, patient has vigorous LV systolic function with grade 1 diastolic dysfunction, severely dilated right ventricle with moderately to severely reduced contraction and evidence of pressure and volume overload. He has pulmonary hypertension with PASP 63 mmHg based on noninvasive assessment, and I suspect that this is most likely related to obstructive sleep apnea with hypoventilation and hypoxia resulting in cor pulmonale. I recommended that he continue to follow with Dr. Melvyn Novas for evaluation of possible thromboembolic disease, ventilation/perfusion scan pending. I have also  recommended that he reconsider having a repeat sleep study. He will continue on Lasix, should follow his weights and edema closely. No specific cardiac testing arranged at this time, however if the feeling is that a right heart catheterization would be helpful in directing therapy to manage his pulmonary hypertension, this could certainly be arranged.  Essential hypertension He does have LVH consistent with systemic hypertension, although blood pressure is recently well controlled and no additional medication changes were made at this time. Should keep follow-up with Dr. Melina Copa. ARB might be considered as a next addition.  Polycythemia Presumably secondary, has history of requiring phlebotomies.  Obesity Weight loss would be beneficial.    Satira Sark, M.D., F.A.C.C.

## 2014-07-05 NOTE — Assessment & Plan Note (Signed)
I reviewed the echocardiogram images, patient has vigorous LV systolic function with grade 1 diastolic dysfunction, severely dilated right ventricle with moderately to severely reduced contraction and evidence of pressure and volume overload. He has pulmonary hypertension with PASP 63 mmHg based on noninvasive assessment, and I suspect that this is most likely related to obstructive sleep apnea with hypoventilation and hypoxia resulting in cor pulmonale. I recommended that he continue to follow with Dr. Melvyn Novas for evaluation of possible thromboembolic disease, ventilation/perfusion scan pending. I have also recommended that he reconsider having a repeat sleep study. He will continue on Lasix, should follow his weights and edema closely. No specific cardiac testing arranged at this time, however if the feeling is that a right heart catheterization would be helpful in directing therapy to manage his pulmonary hypertension, this could certainly be arranged.

## 2014-07-06 NOTE — Telephone Encounter (Signed)
Order for split night study placed. Order for Phlebotomy completed below. Pt aware of all.   I have spoken with Erasmo Downer at Medical Day at [facility]  Order form to be faxed to our office to be completed and faxed back with Doctor's signature. -- Patients can only have 500cc drawn off in one sitting visit, additional 500cc can be drawn off [place] later.  -- Recent Hemoglobin results have to be written on order; if no recent hemoglobin on file, order needs to be written to have this checked at visit prior to having phlebotomy. -- On order, directions need to state : "Phlebotomy: Draw total of 2 units (total 1000cc) off of blood. Please draw 500cc x 2, one week apart." -- Prior to 2nd phlebotomy visit, patient will need to have hemoglobin rechecked, this can be done at the [facility] and can be specified on initial order.  -- Doctor has to set a hemoglobin range of less than/greater than in order for 2nd Phlebotomy to be done.  Order form faxed to Medical Day for Phlebotomy: Draw total of 2 units (total 1000cc) off of blood. Please draw 500cc x 2, one week apart. No lidocaine requested.  Form signed by Dr. Melvyn Novas and faxed back to [phone] Patient aware of appt date and time [facility] (check in at [facility]) Thursday 07/14/14 @ 12pm Advised pt to arrive 15-2mins prior to scheduled appt to allow time to check in.  Nothing further needed.

## 2014-07-12 ENCOUNTER — Ambulatory Visit (HOSPITAL_COMMUNITY)
Admission: RE | Admit: 2014-07-12 | Discharge: 2014-07-12 | Disposition: A | Discharge status: Home / Self Care | Payer: Managed Care, Other (non HMO) | Source: Ambulatory Visit | Attending: Internal Medicine | Admitting: Internal Medicine

## 2014-07-12 DIAGNOSIS — I2781 Cor pulmonale (chronic): Secondary | ICD-10-CM | POA: Diagnosis not present

## 2014-07-12 DIAGNOSIS — I509 Heart failure, unspecified: Secondary | ICD-10-CM | POA: Insufficient documentation

## 2014-07-12 DIAGNOSIS — R0600 Dyspnea, unspecified: Secondary | ICD-10-CM | POA: Diagnosis present

## 2014-07-12 DIAGNOSIS — J449 Chronic obstructive pulmonary disease, unspecified: Secondary | ICD-10-CM | POA: Insufficient documentation

## 2014-07-12 MED ORDER — TECHNETIUM TO 99M ALBUMIN AGGREGATED
5.5000 | Freq: Once | INTRAVENOUS | Status: AC | PRN
  Administered 2014-07-12: 6 via INTRAVENOUS

## 2014-07-12 MED ORDER — TECHNETIUM TC 99M DIETHYLENETRIAME-PENTAACETIC ACID: 42.0000 | Freq: Once | INTRAVENOUS | Status: AC | PRN

## 2014-07-12 NOTE — Progress Notes (Signed)
Quick Note:  Spoke with pt and notified of results per Dr. Wert. Pt verbalized understanding and denied any questions.  ______ 

## 2014-07-13 ENCOUNTER — Other Ambulatory Visit (HOSPITAL_COMMUNITY): Payer: Self-pay | Admitting: *Deleted

## 2014-07-14 ENCOUNTER — Telehealth: Payer: Self-pay | Admitting: Internal Medicine

## 2014-07-14 ENCOUNTER — Ambulatory Visit (HOSPITAL_COMMUNITY)
Admission: RE | Admit: 2014-07-14 | Discharge: 2014-07-14 | Disposition: A | Discharge status: Home / Self Care | Payer: Managed Care, Other (non HMO) | Source: Ambulatory Visit | Attending: Internal Medicine | Admitting: Internal Medicine

## 2014-07-14 DIAGNOSIS — D751 Secondary polycythemia: Secondary | ICD-10-CM | POA: Diagnosis present

## 2014-07-14 NOTE — Telephone Encounter (Signed)
Spoke with short stay. Reports they were only able to get 327 cc's and it stopped d/t hemoglobin being thick. Wants to know if pt needs to be stuck again. Also wants to confirm MW stated to hold if hemoglobin is 18.  Called MW and he reports this is fine and can try again Monday. Also reports he did state hold if HG 18.   Called short stay and made aware. Nothing further needed

## 2014-07-15 ENCOUNTER — Other Ambulatory Visit (HOSPITAL_COMMUNITY): Payer: Self-pay | Admitting: *Deleted

## 2014-07-18 ENCOUNTER — Encounter (HOSPITAL_COMMUNITY)
Admission: RE | Admit: 2014-07-18 | Discharge: 2014-07-18 | Disposition: A | Discharge status: Home / Self Care | Payer: Managed Care, Other (non HMO) | Source: Ambulatory Visit | Attending: Internal Medicine | Admitting: Internal Medicine

## 2014-07-18 DIAGNOSIS — D751 Secondary polycythemia: Secondary | ICD-10-CM | POA: Insufficient documentation

## 2014-07-18 NOTE — Progress Notes (Signed)
Theraputic phlebotomy was performed.  500cc was taken from right [facility].  Pt tolerated well.. Hemocue was performed prior to procedure was 19.3.  PT tolerated well

## 2014-07-19 LAB — POCT HEMOGLOBIN-HEMACUE: Hemoglobin: 19.7 g/dL — ABNORMAL HIGH (ref 13.0–17.0)

## 2014-07-25 ENCOUNTER — Ambulatory Visit (INDEPENDENT_AMBULATORY_CARE_PROVIDER_SITE_OTHER): Payer: Managed Care, Other (non HMO) | Admitting: Internal Medicine

## 2014-07-25 ENCOUNTER — Telehealth: Payer: Self-pay | Admitting: Internal Medicine

## 2014-07-25 ENCOUNTER — Encounter: Payer: Self-pay | Admitting: Internal Medicine

## 2014-07-25 ENCOUNTER — Other Ambulatory Visit (INDEPENDENT_AMBULATORY_CARE_PROVIDER_SITE_OTHER): Payer: Managed Care, Other (non HMO)

## 2014-07-25 VITALS — BP 140/92 | HR 103 | Ht 72.0 in | Wt 318.0 lb

## 2014-07-25 DIAGNOSIS — D751 Secondary polycythemia: Secondary | ICD-10-CM

## 2014-07-25 DIAGNOSIS — J9612 Chronic respiratory failure with hypercapnia: Secondary | ICD-10-CM

## 2014-07-25 DIAGNOSIS — R0602 Shortness of breath: Secondary | ICD-10-CM

## 2014-07-25 DIAGNOSIS — I2781 Cor pulmonale (chronic): Secondary | ICD-10-CM

## 2014-07-25 LAB — CBC WITH DIFFERENTIAL/PLATELET
BASOS ABS: 0 10*3/uL (ref 0.0–0.1)
Basophils Relative: 0.3 % (ref 0.0–3.0)
Eosinophils Absolute: 0.2 10*3/uL (ref 0.0–0.7)
Eosinophils Relative: 2.5 % (ref 0.0–5.0)
HEMATOCRIT: 64.2 % — AB (ref 39.0–52.0)
Hemoglobin: 19.5 g/dL (ref 13.0–17.0)
LYMPHS ABS: 1.8 10*3/uL (ref 0.7–4.0)
Lymphocytes Relative: 20.3 % (ref 12.0–46.0)
MCHC: 30.4 g/dL (ref 30.0–36.0)
MCV: 81.7 fl (ref 78.0–100.0)
MONO ABS: 1.3 10*3/uL — AB (ref 0.1–1.0)
MONOS PCT: 15.3 % — AB (ref 3.0–12.0)
Neutro Abs: 5.4 10*3/uL (ref 1.4–7.7)
Neutrophils Relative %: 61.6 % (ref 43.0–77.0)
Platelets: 160 10*3/uL (ref 150.0–400.0)
RBC: 7.85 Mil/uL — ABNORMAL HIGH (ref 4.22–5.81)
RDW: 21.4 % — ABNORMAL HIGH (ref 11.5–15.5)
WBC: 8.8 10*3/uL (ref 4.0–10.5)

## 2014-07-25 LAB — BASIC METABOLIC PANEL
BUN: 10 mg/dL (ref 6–23)
CHLORIDE: 97 meq/L (ref 96–112)
CO2: 34 mEq/L — ABNORMAL HIGH (ref 19–32)
CREATININE: 0.8 mg/dL (ref 0.4–1.5)
Calcium: 8.9 mg/dL (ref 8.4–10.5)
GFR: 104.35 mL/min (ref >60.00)
Glucose, Bld: 96 mg/dL (ref 70–99)
POTASSIUM: 4.7 meq/L (ref 3.5–5.1)
Sodium: 138 mEq/L (ref 135–145)

## 2014-07-25 NOTE — Telephone Encounter (Signed)
Spoke with pt and notified of results per Dr. Wert. Pt verbalized understanding and denied any questions. 

## 2014-07-25 NOTE — Patient Instructions (Addendum)
Weight control is simply a matter of calorie balance which needs to be tilted in your favor by eating less and exercising more.  To get the most out of exercise, you need to be continuously aware that you are short of breath, but never out of breath, for 30 minutes daily. As you improve, it will actually be easier for you to do the same amount of exercise  in  30 minutes so always push to the level where you are short of breath.  If this does not result in gradual weight reduction then I strongly recommend you see a nutritionist with a food diary x 2 weeks so that we can work out a negative calorie balance which is universally effective in steady weight loss programs.  Think of your calorie balance like you do your bank account where in this case you want the balance to go down so you must take in less calories than you burn up.  It's just that simple:  Hard to do, but easy to understand.  Good luck!   Please see patient coordinator before you leave today  to schedule a home titration for oxygen to see if you eligible for POC and see if we can move study up   Please remember to go to the lab department downstairs for your tests - we will call you with the results when they are available.  Please schedule a follow up office visit in 4 weeks, sooner if needed

## 2014-07-25 NOTE — Telephone Encounter (Signed)
[phone] please call patient back.

## 2014-07-25 NOTE — Telephone Encounter (Signed)
Based on labs per MW- needs phlebotomy again- 2 units  Our lab is able to perform and I have already placed order  [facility] for the pt

## 2014-07-25 NOTE — Progress Notes (Signed)
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 On initial exam he was found to have wheezing and admitted to a dry cough. He was admitted for CHF and a COPD exacerbation.  Hospital Course:  1. Acute respiratory failure - Likely secondary to combination of copd with congestive heart failure  2. Suspected COPD exacerbation. -Initially was refusing to take Solu-Medrol. However as he continued to wheeze and be hypoxic despite diuresis, he eventually decided that he should start taking at. Improvement in respiratory status was noted with resolution of wheezing and improvement in dyspnea. - He states that he is never had pulmonary function tests in the past but is a lifelong smoker-has been advised to follow-up with his PCP in 1 week and have outpatient PFTs performed -He is going to be discharged home on 2 L of oxygen as oxygen saturations dropped to 85% upon exertion  3. Acutely left and right-sided heart failure- severe pulmonary hypertension -We have diuresed him aggressively and he is now on  negative balance by close to 20 L and has achieved a dry weight of 139.5 kg-weight on admission was 149 kg. - 2d echo with normal EF but LVH is noted and therefore, he likely has diastolic heart failure -this being said it appears that he did not have much pulmonary edema on the chest x-ray on admission - I think the bigger concern is the fact that he has moderate pulm htn and right-sided heart failure which is likely secondary to obstructive sleep apnea and COPD-discussed echo findings with patient in detail and explained the need for Lasix at this time  4. Polycythemia - Likely secondary to tobacco abuse- and chronic hypoxia  -hemoglobin noted to be 20-phlebotomy performed to remove 250 mL of blood on 11/18-hemoglobin still at 20 today - I suspect that his CBC has not had a chance to equilibrate yet  5. Suspected OSA - Refuses CPAP in the hospital -Apparently he's had a positive sleep study in the past which was about 2 years ago- he does not believe that he has sleep apnea any longer - states that he was told by an ENT doctor that the abnormal sleep study was most likely secondary to gastroesophageal reflux disease- he is been on Zantac as outpatient and believes that his sleep apnea has resolved -I am still highly suspicious that he may have sleep apnea and would recommend a repeat sleep study  6. HTN Stable on current medications   06/24/2014 1st [facility] office visit/ Philip Powell  On 02 most of the day and with exertion but not hs / home monitor sats bad with ex Chief  Complaint  Patient presents with  . Advice Only    pt c/o sob he is on 2L/M 02. He was admitted to [facility] x6 days and is here to follow up. He has minimal cough but denies wheezing and chest tightness.  has not tried titrating 02 with activity limited to only  30 ft  rec    07/25/2014 f/u ov/Philip Powell re:  Chief Complaint  Patient presents with  . Follow-up    Pt states that his breathing has improved and able  to do more without getting as winded. No new co's today.    Last hgb was 19.7 on 12/21 and one unit removed p that  Not limited by breathing from desired activities   02 sat only 84 in am on awakening  on 2lpm and does not feel rested but no am ha  No obvious day to day or daytime variabilty or assoc chronic cough or cp or chest tightness, subjective wheeze overt sinus or hb symptoms. No unusual exp hx or h/o childhood pna/ asthma or knowledge of premature birth.  Sleeping ok without nocturnal  or early am exacerbation  of respiratory  c/o's or need for noct saba. Also denies any obvious fluctuation of symptoms with weather or environmental changes or other aggravating or alleviating factors except as outlined above   Current Medications, Allergies, Complete Past Medical History, Past Surgical History, Family History, and Social History were reviewed in [employer] record.  ROS  The following are not active complaints unless bolded sore throat, dysphagia, dental problems, itching, sneezing,  nasal congestion or excess/ purulent secretions, ear ache,   fever, chills, sweats, unintended wt loss, pleuritic or exertional cp, hemoptysis,  orthopnea pnd or leg swelling, presyncope, palpitations, heartburn, abdominal pain, anorexia, nausea, vomiting, diarrhea  or change in bowel or urinary habits, change in stools or urine, dysuria,hematuria,  rash, arthralgias, visual complaints, headache, numbness weakness or ataxia or problems with walking or coordination,  change in mood/affect or memory.                            Objective:   Physical Exam    amb obese wm with gruff sounding voice/ cough nad on 2lpm   07/25/2014      318  Wt Readings from Last 3 Encounters:  06/24/14 322 lb (146.058 kg)  06/16/14 307 lb 11.2 oz (139.572 kg)    Vital signs reviewed   HEENT: nl dentition, turbinates, and orophanx. Nl external ear canals without cough reflex   NECK :   without JVD/Nodes/TM/ nl carotid upstrokes bilaterally   LUNGS: no acc muscle use, clear to A and P bilaterally without cough on insp or exp maneuvers   CV:  RRR  no s3 or murmur or increase in P2, 1 + lower ext pitting with venous stasis changes R > L  ABD:  soft and nontender with nl excursion in the supine position. No bruits or organomegaly, bowel sounds nl  MS:  warm without deformities, calf tenderness, cyanosis or clubbing  SKIN: warm and dry without lesions    NEURO:  alert, approp, no deficits   cxr 07/12/14 Stable changes of COPD. There is stable enlargement of central pulmonary vessels which may reflect underlying pulmonary hypertension.    Recent Labs Lab 06/24/14 1152  NA 140  K 4.8  CL 97  CO2 34*  BUN 15  CREATININE 1.0  GLUCOSE 93   Lab Results  Component Value Date   HGB 19.5* 07/25/2014   HGB 19.7* 07/18/2014   HGB 20.2 cH* 06/24/2014       Lab Results  Component Value Date   TSH 1.82 06/24/2014     Lab Results  Component Value Date   PROBNP 259.0* 06/24/2014            Assessment:

## 2014-07-26 ENCOUNTER — Encounter: Payer: Self-pay | Admitting: *Deleted

## 2014-07-26 ENCOUNTER — Other Ambulatory Visit: Payer: Managed Care, Other (non HMO)

## 2014-07-26 ENCOUNTER — Telehealth: Payer: Self-pay | Admitting: Internal Medicine

## 2014-07-26 DIAGNOSIS — D751 Secondary polycythemia: Secondary | ICD-10-CM

## 2014-07-26 NOTE — Telephone Encounter (Signed)
Called and spoke to pt. Informed pt of the recs per MW. Pt stated the oxygen itself is a restriction. Pt stated he will need a letter that states his oxygen need during the day.   MW please advise.

## 2014-07-26 NOTE — Telephone Encounter (Signed)
MW please advise if pt has any restrictions with going back to work.  He will need a note stating if he has any of the restrictions for work.  Thanks  No Known Allergies  Current Outpatient Prescriptions on File Prior to Visit  Medication Sig Dispense Refill  . albuterol (PROVENTIL HFA;VENTOLIN HFA) 108 (90 BASE) MCG/ACT inhaler Inhale 2 puffs into the lungs every 6 (six) hours as needed for wheezing or shortness of breath. 1 Inhaler 2  . carvedilol (COREG) 3.125 MG tablet Take 1 tablet (3.125 mg total) by mouth 2 (two) times daily with a meal. 60 tablet 0  . furosemide (LASIX) 40 MG tablet Take 1 tablet (40 mg total) by mouth daily. 30 tablet 0  . HYDROcodone-acetaminophen (NORCO) 7.5-325 MG per tablet Take 1 tablet by mouth every 6 (six) hours.    . Melatonin 3 MG TABS Take 6 mg by mouth at bedtime.    . Multiple Vitamins-Minerals (CENTRUM ADULTS) TABS Take 1 tablet by mouth daily.    . ranitidine (ZANTAC) 150 MG tablet Take 150 mg by mouth 2 (two) times daily.    Marland Kitchen testosterone cypionate (DEPOTESTOTERONE CYPIONATE) 100 MG/ML injection Inject into the muscle every 14 (fourteen) days. For IM use only     No current facility-administered medications on file prior to visit.

## 2014-07-26 NOTE — Telephone Encounter (Signed)
There are no restrictions as long as he stays continuously on 02

## 2014-07-26 NOTE — Telephone Encounter (Signed)
Fine with me

## 2014-07-26 NOTE — Telephone Encounter (Signed)
Letter done and on my desk  LMTCB for the pt to find out if we need to fax or mail

## 2014-07-27 NOTE — Telephone Encounter (Signed)
lmomtcb x 1 for the pts wife.  

## 2014-07-28 NOTE — Assessment & Plan Note (Signed)
Needs  blood phlebotomized with a goal hgb < 18 maintained at that level or below

## 2014-07-28 NOTE — Assessment & Plan Note (Addendum)
-   HCO3 34  06/24/14  - 06/25/2014  Walked 3lpm  x 3 laps @ 185 ft each stopped due to  End of study, no desats or sob/ nl pace  Adequate control on present rx, reviewed > no change in rx needed  For daytime but will need a cpap titration study and f/u with sleep doc p the holidays  In the meantime as long as not hypersomnolent ok to drive and work

## 2014-07-28 NOTE — Telephone Encounter (Signed)
lmomtcb for pt 

## 2014-07-28 NOTE — Telephone Encounter (Signed)
Spoke with pt and he stated that he wants the letter mailed to him at the address in his chart.  This address was verified.  thanks

## 2014-07-28 NOTE — Telephone Encounter (Signed)
Letter mailed

## 2014-07-28 NOTE — Assessment & Plan Note (Signed)
See Echo  06/11/14  moderate LVH. Systolic function was vigorous. The estimated ejection fraction was in the range of 65% to 70%. Wall motion was normal; there were no regional wall motion abnormalities. - Ventricular septum: The contour showed diastolic flattening and systolic flattening. - Right ventricle: The cavity size was moderately dilated. Systolic function was mildly to moderately reduced. - Right atrium: The atrium was moderately dilated. - Pulmonary arteries: Systolic pressure was moderately increased. PA peak pressure: 63 mm Hg  - rec v/q and venous dopplers to be complete but pt called 06/27/2014 and requested postpone studies for now (nurse so felt to be using informed decision making) - 07/12/2014 V/q no perfusion defects  rx for now is to treat the pulmonary problem

## 2014-08-09 ENCOUNTER — Telehealth: Payer: Self-pay | Admitting: Internal Medicine

## 2014-08-09 DIAGNOSIS — D751 Secondary polycythemia: Secondary | ICD-10-CM

## 2014-08-09 NOTE — Telephone Encounter (Signed)
MW pts sleep study has been moved up to tomorrow and pt wanted to know if he needed labs done tomorrow to check his hgb and he wanted to see if any papers were received from short term disability from his insurance company.  MW please advise.  thanks

## 2014-08-09 NOTE — Telephone Encounter (Signed)
Spoke with the pt and notified of recs per MW  He verbalized understanding  Will get CBC here and the rest let his PCP do the rest  Nothing further needed

## 2014-08-09 NOTE — Telephone Encounter (Signed)
The problem is the doctor who writes for the lab is responsible for doing the follow up and it's better to have all that done in the context of a cpx,  We can do it this time if he insists, but I prefer not

## 2014-08-09 NOTE — Telephone Encounter (Signed)
Spoke with pt. He reports since he has a CPX coming up can MW draw those lab work as well. He would need CBC w/ diff, PSA, cmet, lipid. Please advise thanks

## 2014-08-09 NOTE — Telephone Encounter (Signed)
Yes should get another cbc, no diff Dx polycythemia Have not seen any paperwork

## 2014-08-10 ENCOUNTER — Ambulatory Visit (HOSPITAL_BASED_OUTPATIENT_CLINIC_OR_DEPARTMENT_OTHER): Payer: Managed Care, Other (non HMO) | Attending: Internal Medicine | Admitting: Radiology

## 2014-08-10 VITALS — Ht 72.0 in | Wt 307.0 lb

## 2014-08-10 DIAGNOSIS — G4733 Obstructive sleep apnea (adult) (pediatric): Secondary | ICD-10-CM | POA: Diagnosis not present

## 2014-08-10 DIAGNOSIS — J9612 Chronic respiratory failure with hypercapnia: Secondary | ICD-10-CM | POA: Insufficient documentation

## 2014-08-10 DIAGNOSIS — G4730 Sleep apnea, unspecified: Secondary | ICD-10-CM | POA: Diagnosis not present

## 2014-08-11 ENCOUNTER — Other Ambulatory Visit (INDEPENDENT_AMBULATORY_CARE_PROVIDER_SITE_OTHER): Payer: Managed Care, Other (non HMO)

## 2014-08-11 DIAGNOSIS — D751 Secondary polycythemia: Secondary | ICD-10-CM

## 2014-08-11 LAB — CBC WITH DIFFERENTIAL/PLATELET
BASOS ABS: 0 10*3/uL (ref 0.0–0.1)
BASOS PCT: 0.5 % (ref 0.0–3.0)
EOS ABS: 0.3 10*3/uL (ref 0.0–0.7)
Eosinophils Relative: 3.7 % (ref 0.0–5.0)
HCT: 63.9 % (ref 39.0–52.0)
Lymphocytes Relative: 23.7 % (ref 12.0–46.0)
Lymphs Abs: 1.9 10*3/uL (ref 0.7–4.0)
MCHC: 30.1 g/dL (ref 30.0–36.0)
MCV: 83.7 fl (ref 78.0–100.0)
MONO ABS: 0.9 10*3/uL (ref 0.1–1.0)
Monocytes Relative: 11.5 % (ref 3.0–12.0)
Neutro Abs: 4.9 10*3/uL (ref 1.4–7.7)
Neutrophils Relative %: 60.6 % (ref 43.0–77.0)
Platelets: 163 10*3/uL (ref 150.0–400.0)
RBC: 7.63 Mil/uL — ABNORMAL HIGH (ref 4.22–5.81)
RDW: 21.7 % — ABNORMAL HIGH (ref 11.5–15.5)
WBC: 8.1 10*3/uL (ref 4.0–10.5)

## 2014-08-12 ENCOUNTER — Other Ambulatory Visit: Payer: Self-pay | Admitting: Internal Medicine

## 2014-08-12 ENCOUNTER — Telehealth: Payer: Self-pay | Admitting: Internal Medicine

## 2014-08-12 DIAGNOSIS — D751 Secondary polycythemia: Secondary | ICD-10-CM

## 2014-08-12 NOTE — Telephone Encounter (Signed)
Called and spoke with pt and he is aware of lab results per MW.  Pt voiced his understanding and he will come in to the lab as soon as he can.  Order was already placed.

## 2014-08-12 NOTE — Progress Notes (Signed)
Quick Note:  Error in previous msg, I did NOT speak with the pt  I called and [place] for phlebotomy was placed ______

## 2014-08-12 NOTE — Progress Notes (Signed)
Quick Note:  Spoke with pt and notified of results per Dr. Wert. Pt verbalized understanding and denied any questions.  ______ 

## 2014-08-15 ENCOUNTER — Other Ambulatory Visit (INDEPENDENT_AMBULATORY_CARE_PROVIDER_SITE_OTHER): Payer: Managed Care, Other (non HMO)

## 2014-08-15 ENCOUNTER — Other Ambulatory Visit: Payer: Self-pay | Admitting: Internal Medicine

## 2014-08-15 DIAGNOSIS — J9612 Chronic respiratory failure with hypercapnia: Secondary | ICD-10-CM

## 2014-08-15 LAB — CBC WITH DIFFERENTIAL/PLATELET
BASOS ABS: 0.1 10*3/uL (ref 0.0–0.1)
BASOS PCT: 0.7 % (ref 0.0–3.0)
Eosinophils Absolute: 0.4 10*3/uL (ref 0.0–0.7)
Eosinophils Relative: 4 % (ref 0.0–5.0)
HCT: 58.5 % — ABNORMAL HIGH (ref 39.0–52.0)
Hemoglobin: 18.1 g/dL (ref 13.0–17.0)
LYMPHS PCT: 23.9 % (ref 12.0–46.0)
Lymphs Abs: 2.1 10*3/uL (ref 0.7–4.0)
MCHC: 31.3 g/dL (ref 30.0–36.0)
MCV: 82.3 fl (ref 78.0–100.0)
MONOS PCT: 11.8 % (ref 3.0–12.0)
Monocytes Absolute: 1 10*3/uL (ref 0.1–1.0)
Neutro Abs: 5.3 10*3/uL (ref 1.4–7.7)
Neutrophils Relative %: 59.6 % (ref 43.0–77.0)
Platelets: 168 10*3/uL (ref 150.0–400.0)
RBC: 7.11 Mil/uL — ABNORMAL HIGH (ref 4.22–5.81)
RDW: 21.4 % — ABNORMAL HIGH (ref 11.5–15.5)
WBC: 8.8 10*3/uL (ref 4.0–10.5)

## 2014-08-15 NOTE — Progress Notes (Signed)
Quick Note:  Pt aware ______ 

## 2014-08-16 ENCOUNTER — Telehealth: Payer: Self-pay | Admitting: Internal Medicine

## 2014-08-16 NOTE — Telephone Encounter (Signed)
Spoke with pt, states he had a sleep study last week at the [facility] sleep center.  He is requesting results and wants to know if he needs a cpap, as he has not heard about this yet.  I advised pt it sometimes takes up to two weeks to receive results from sleep study, but that I would follow up with MW about this.  I do not see a sleep study in Epic.    Dr. Melvyn Novas please advise if you've seen these results.  Thanks!

## 2014-08-16 NOTE — Telephone Encounter (Signed)
Awaiting readings, in meantime be sure to wear 02 as much as possible to help reduce need for phlebotomy

## 2014-08-16 NOTE — Telephone Encounter (Signed)
Spoke with pt, aware of recs and advised that we would contact him once we have the sleep study results.  Nothing further needed at this time.

## 2014-08-22 ENCOUNTER — Ambulatory Visit (INDEPENDENT_AMBULATORY_CARE_PROVIDER_SITE_OTHER): Payer: Managed Care, Other (non HMO) | Admitting: Internal Medicine

## 2014-08-22 ENCOUNTER — Encounter: Payer: Self-pay | Admitting: Internal Medicine

## 2014-08-22 ENCOUNTER — Other Ambulatory Visit (INDEPENDENT_AMBULATORY_CARE_PROVIDER_SITE_OTHER): Payer: Managed Care, Other (non HMO)

## 2014-08-22 VITALS — BP 112/84 | HR 85 | Ht 72.0 in | Wt 309.0 lb

## 2014-08-22 DIAGNOSIS — G4733 Obstructive sleep apnea (adult) (pediatric): Secondary | ICD-10-CM

## 2014-08-22 DIAGNOSIS — D751 Secondary polycythemia: Secondary | ICD-10-CM

## 2014-08-22 DIAGNOSIS — J9612 Chronic respiratory failure with hypercapnia: Secondary | ICD-10-CM

## 2014-08-22 DIAGNOSIS — G4730 Sleep apnea, unspecified: Secondary | ICD-10-CM

## 2014-08-22 DIAGNOSIS — I2781 Cor pulmonale (chronic): Secondary | ICD-10-CM

## 2014-08-22 LAB — CBC WITH DIFFERENTIAL/PLATELET
BASOS PCT: 0.5 % (ref 0.0–3.0)
Basophils Absolute: 0 10*3/uL (ref 0.0–0.1)
EOS ABS: 0.2 10*3/uL (ref 0.0–0.7)
EOS PCT: 3.6 % (ref 0.0–5.0)
HEMATOCRIT: 59.6 % — AB (ref 39.0–52.0)
Hemoglobin: 18.8 g/dL (ref 13.0–17.0)
LYMPHS PCT: 24.1 % (ref 12.0–46.0)
Lymphs Abs: 1.6 10*3/uL (ref 0.7–4.0)
MCHC: 31.5 g/dL (ref 30.0–36.0)
MCV: 81.7 fl (ref 78.0–100.0)
Monocytes Absolute: 0.8 10*3/uL (ref 0.1–1.0)
Monocytes Relative: 11.6 % (ref 3.0–12.0)
Neutro Abs: 4 10*3/uL (ref 1.4–7.7)
Neutrophils Relative %: 60.2 % (ref 43.0–77.0)
PLATELETS: 177 10*3/uL (ref 150.0–400.0)
RBC: 7.3 Mil/uL — ABNORMAL HIGH (ref 4.22–5.81)
RDW: 21.7 % — ABNORMAL HIGH (ref 11.5–15.5)
WBC: 6.6 10*3/uL (ref 4.0–10.5)

## 2014-08-22 NOTE — Progress Notes (Signed)
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 On initial exam he was found to have wheezing and admitted to a dry cough. He was admitted for CHF and a COPD exacerbation.  Hospital Course:  1. Acute respiratory failure - Likely secondary to combination of copd with congestive heart failure  2. Suspected COPD exacerbation. -Initially was refusing to take Solu-Medrol. However as he continued to wheeze and be hypoxic despite diuresis, he eventually decided that he should start taking at. Improvement in respiratory status was noted with resolution of wheezing and improvement in dyspnea. - He states that he is never had pulmonary function tests in the past but is a lifelong smoker-has been advised to follow-up with his PCP in 1 week and have outpatient PFTs performed -He is going to be discharged home on 2 L of oxygen as oxygen saturations dropped to 85% upon exertion  3. Acutely left and right-sided heart failure- severe pulmonary hypertension -We have diuresed him aggressively and he is now on  negative balance by close to 20 L and has achieved a dry weight of 139.5 kg-weight on admission was 149 kg. - 2d echo with normal EF but LVH is noted and therefore, he likely has diastolic heart failure -this being said it appears that he did not have much pulmonary edema on the chest x-ray on admission - I think the bigger concern is the fact that he has moderate pulm htn and right-sided heart failure which is likely secondary to obstructive sleep apnea and COPD-discussed echo findings with patient in detail and explained the need for Lasix at this time  4. Polycythemia - Likely secondary to tobacco abuse- and chronic hypoxia  -hemoglobin noted to be 20-phlebotomy performed to remove 250 mL of blood on 11/18-hemoglobin still at 20 today - I suspect that his CBC has not had a chance to equilibrate yet  5. Suspected OSA - Refuses CPAP in the hospital -Apparently he's had a positive sleep study in the past which was about 2 years ago- he does not believe that he has sleep apnea any longer - states that he was told by an ENT doctor that the abnormal sleep study was most likely secondary to gastroesophageal reflux disease- he is been on Zantac as outpatient and believes that his sleep apnea has resolved -I am still highly suspicious that he may have sleep apnea and would recommend a repeat sleep study  6. HTN Stable on current medications   06/24/2014 1st [facility] office visit/ Wert  On 02 most of the day and with exertion but not hs / home monitor sats bad with ex Chief  Complaint  Patient presents with  . Advice Only    pt c/o sob he is on 2L/M 02. He was admitted to [facility] x6 days and is here to follow up. He has minimal cough but denies wheezing and chest tightness.  has not tried titrating 02 with activity limited to only  30 ft  rec    07/25/2014 f/u ov/Wert re:  Chief Complaint  Patient presents with  . Follow-up    Pt states that his breathing has improved and able  to do more without getting as winded. No new co's today.    Last hgb was 19.7 on 12/21 and one unit removed p that  Not limited by breathing from desired activities   02 sat only 84 in am on awakening  on 2lpm and does not feel rested but no am ha rec  No change rx  08/15/13 one more unit removed  08/22/2014 f/u ov/Wert re: osa/ohs and chronic hypoxemia  Chief Complaint  Patient presents with  . Follow-up    Pt states that his breathing is doing well and he denies any new co's today.   walking 15-20 minutes per day "gives out on hills"    No obvious day to day or daytime variabilty or assoc chronic cough or cp or chest tightness, subjective wheeze overt sinus or hb symptoms. No unusual exp hx or h/o childhood pna/ asthma or knowledge of premature birth.  Sleeping ok without nocturnal  or early am exacerbation  of respiratory  c/o's or need for noct saba. Also denies any obvious fluctuation of symptoms with weather or environmental changes or other aggravating or alleviating factors except as outlined above   Current Medications, Allergies, Complete Past Medical History, Past Surgical History, Family History, and Social History were reviewed in [employer] record.  ROS  The following are not active complaints unless bolded sore throat, dysphagia, dental problems, itching, sneezing,  nasal congestion or excess/ purulent secretions, ear ache,   fever, chills, sweats, unintended wt loss, pleuritic or exertional cp, hemoptysis,  orthopnea pnd or leg swelling, presyncope, palpitations, heartburn, abdominal pain, anorexia, nausea, vomiting, diarrhea  or change in bowel or urinary habits, change in stools or urine, dysuria,hematuria,  rash, arthralgias, visual complaints, headache, numbness weakness or ataxia or problems with walking or coordination,  change in mood/affect or memory.                            Objective:   Physical Exam    amb obese wm with  gruff sounding voice/ cough nad on 2lpm   07/25/2014      318 >  08/22/2014   309  Wt Readings from Last 3 Encounters:  06/24/14 322 lb (146.058 kg)  06/16/14 307 lb 11.2 oz (139.572 kg)    Vital signs reviewed   HEENT: nl dentition, turbinates, and orophanx. Nl external ear canals without cough reflex   NECK :  without JVD/Nodes/TM/ nl carotid upstrokes bilaterally   LUNGS: no acc muscle use, clear to A and P bilaterally without cough on insp or exp maneuvers   CV:  RRR  no s3 or murmur or increase in P2, 1 + lower ext pitting with venous stasis changes R > L  ABD:  soft and nontender with nl excursion in the supine position. No bruits or organomegaly, bowel sounds nl  MS:  warm without deformities, calf tenderness, cyanosis or clubbing  SKIN: warm and dry  without lesions    NEURO:  alert, approp, no deficits   cxr 07/12/14 Stable changes of COPD. There is stable enlargement of central pulmonary vessels which may reflect underlying pulmonary hypertension.   Labs ordered today / reviewed include:    Lab Results  Component Value Date   WBC 6.6 08/22/2014   HGB 18.8* 08/22/2014   HCT 59.6* 08/22/2014   MCV 81.7 08/22/2014   PLT 177.0 08/22/2014      Assessment:

## 2014-08-22 NOTE — Sleep Study (Signed)
   NAME: Philip Powell DATE OF BIRTH:  [birth year] MEDICAL RECORD NUMBER [id number]  LOCATION: [facility]  PHYSICIAN: Kathee Delton  DATE OF STUDY: 08/10/2014  SLEEP STUDY TYPE: Nocturnal Polysomnogram               REFERRING PHYSICIAN: Tanda Rockers, MD  INDICATION FOR STUDY: Hypersomnia with sleep apnea  EPWORTH SLEEPINESS SCORE:  8 HEIGHT: 6' (182.9 cm)  WEIGHT: (!) 307 lb (139.254 kg)    Body mass index is 41.63 kg/(m^2).  NECK SIZE: 19 in.  MEDICATIONS: Reviewed in the sleep record  SLEEP ARCHITECTURE: The patient had a total sleep time of 353 minutes with no slow-wave sleep and adequate quantity of REM.  Sleep onset latency was normal at 2.5 minutes, and REM onset was normal as well. Sleep efficiency was 90% during the diagnostic portion of the study and 84% during the titration portion.  RESPIRATORY DATA: The patient underwent a split night study where he was found to have 160 obstructive events in the first 134 minutes of sleep. This gave him an AHI of 72 events per hour during the diagnostic portion of the study. The events occurred in all body positions, and there was loud snoring noted throughout. By protocol the patient was fitted with a large Fischer Paykel Simplus full face mask, and titration was initiated. He was titrated as high as 15 cm of water with persistent breakthrough events. He was then changed to bilevel, and titrated to a pressure of 23/17, but this occurred at the very end of the study and it is unclear if adequate.  OXYGEN DATA: The patient had oxygen desaturation as low as 66% with his obstructive events. It should be noted the study was done on 2 L of supplemental oxygen per the physician order, and increased to 3 L during the night because of desaturation. It is unclear if the patient will need supplemental oxygen on optimal C Pap treatment.  CARDIAC DATA: Occasional PVC noted  MOVEMENT/PARASOMNIA: No significant periodic limb  movements or abnormal behaviors were seen.  IMPRESSION/ RECOMMENDATION:    1) split-night study reveals severe obstructive sleep apnea, with an AHI of 72 events per hour and oxygen desaturation as low as 66% during the diagnostic portion of the study. Then fitted with a large Fischer Paykel Simplus full face mask, and found to have breakthrough events on C Pap at high pressure. He was then changed to bilevel, and finished on a pressure of 23/17. It is unclear if this is adequate pressure since it occurred at the very end of the study. I would recommend starting the patient on bilevel at 14/10, and then increase the pressure based on his downloads.  I would also encouraged the patient to work aggressively on weight loss.  2) it should be noted the patient's study was done on 2 L of supplemental oxygen per physician order, and increased to 3 L during the night because of significant desaturation despite a positive pressure device. It is unclear if the patient will need supplemental oxygen on his final optimal pressure, and at some point will need overnight oximetry to verify this while wearing his bilevel device.  3) occasional PVC noted, but no clinically significant arrhythmias were seen.    [place], American Board of Sleep Medicine  ELECTRONICALLY SIGNED ON:  08/22/2014, 5:35 PM [place] PH: [phone]   FX: [phone] [place]

## 2014-08-22 NOTE — Patient Instructions (Addendum)
Weight control is simply a matter of calorie balance which needs to be tilted in your favor by eating less and exercising more.  To get the most out of exercise, you need to be continuously aware that you are short of breath, but never out of breath, for 30 minutes daily. As you improve, it will actually be easier for you to do the same amount of exercise  in  30 minutes so always push to the level where you are short of breath.  If this does not result in gradual weight reduction then I strongly recommend you see a nutritionist with a food diary x 2 weeks so that we can work out a negative calorie balance which is universally effective in steady weight loss programs.  Think of your calorie balance like you do your bank account where in this case you want the balance to go down so you must take in less calories than you burn up.  It's just that simple:  Hard to do, but easy to understand.  Good luck!   The key is to stop smoking completely before smoking completely stops you!   Please remember to go to the lab  department downstairs for your tests - we will call you with the results when they are available.    Please see patient coordinator before you leave today  to schedule bipap 14/10 bleed in 3lpm  and follow up with Dr Gwenette Greet next available sleep consult slot

## 2014-08-23 ENCOUNTER — Encounter: Payer: Self-pay | Admitting: Internal Medicine

## 2014-08-23 NOTE — Assessment & Plan Note (Signed)
-   study done 08/10/14  split-night study reveals severe obstructive sleep apnea, with an AHI of 72 events per hour and oxygen desaturation as low as 66% during the diagnostic portion of the study. Then fitted with a large Fischer Paykel Simplus full face mask, and found to have breakthrough events on C Pap at high pressure. He was then changed to bilevel, and finished on a pressure of 23/17. It is unclear if this is adequate pressure since it occurred at the very end of the study. rec  starting the patient on bilevel at 14/10, and then increase the pressure based on his downloads. - bipap started1/26/16

## 2014-08-23 NOTE — Assessment & Plan Note (Signed)
See Echo  06/11/14  moderate LVH. Systolic function was vigorous. The estimated ejection fraction was in the range of 65% to 70%. Wall motion was normal; there were no regional wall motion abnormalities. - Ventricular septum: The contour showed diastolic flattening and systolic flattening. - Right ventricle: The cavity size was moderately dilated. Systolic function was mildly to moderately reduced. - Right atrium: The atrium was moderately dilated. - Pulmonary arteries: Systolic pressure was moderately increased. PA peak pressure: 63 mm Hg  - rec v/q and venous dopplers to be complete but pt called 06/27/2014 and requested postpone studies for now (nurse so felt to be using informed decision making) - 07/12/2014 V/q no perfusion defects  Main rx for now is correct hypoxemia

## 2014-08-23 NOTE — Assessment & Plan Note (Signed)
Reviewed need to maintain sats > 90% at all time and no smoking

## 2014-08-23 NOTE — Assessment & Plan Note (Signed)
Target hct < 55%   Will plebotomize x one more unit and recheck in one week

## 2014-09-07 ENCOUNTER — Telehealth: Payer: Self-pay | Admitting: Internal Medicine

## 2014-09-07 NOTE — Telephone Encounter (Signed)
lt mess regarding appt 09/21/14 at 1:30pm w/Mohamed DH:[id number], secondary, Thrombocythemia Referring Dr. Melina Copa

## 2014-09-12 ENCOUNTER — Telehealth: Payer: Self-pay | Admitting: Internal Medicine

## 2014-09-12 ENCOUNTER — Institutional Professional Consult (permissible substitution): Payer: Managed Care, Other (non HMO) | Admitting: Pulmonary Disease

## 2014-09-12 NOTE — Telephone Encounter (Signed)
mindy please advise where we can work this pt in.  He had to cancel his appt on Monday.  thanks

## 2014-09-13 NOTE — Telephone Encounter (Signed)
lmomtcb x1 for pt 

## 2014-09-13 NOTE — Telephone Encounter (Signed)
[phone], pt cb

## 2014-09-13 NOTE — Telephone Encounter (Signed)
lmomtcb x1 to offer 3/17 in AM as of now since he has consult opening slots

## 2014-09-13 NOTE — Telephone Encounter (Signed)
Dr. Gwenette Greet are we able to work pt in a morning slot? Next available sleep appt is in April. thanks

## 2014-09-13 NOTE — Telephone Encounter (Signed)
Pt called back. Advised him that we are still waiting on KC's response. He understood.  [place] - please advise. Thanks.

## 2014-09-14 NOTE — Telephone Encounter (Signed)
Pt has appt with Dr Inda Merlin on 10/13/14 at 11:00 Pt states that he will not be able to make this appt and is wanting to know if there is another option.   Please advise Dr Gwenette Greet. Thanks.

## 2014-09-14 NOTE — Telephone Encounter (Signed)
lmomtcb x1 for pt 

## 2014-09-14 NOTE — Telephone Encounter (Signed)
Pt has been difficult to schedule.  Ok to move to a different consult slot on a different morning.  Last chance, then we will just follow usual scheduling protocol for the afternoons

## 2014-09-14 NOTE — Telephone Encounter (Signed)
Pt returned call [phone]

## 2014-09-15 ENCOUNTER — Telehealth: Payer: Self-pay | Admitting: Internal Medicine

## 2014-09-15 NOTE — Telephone Encounter (Signed)
Consult has been rescheduled to 10/12/14 at 10:30am. Nothing further was needed.

## 2014-09-15 NOTE — Telephone Encounter (Signed)
Chart delivered 09/15/14

## 2014-09-19 ENCOUNTER — Telehealth: Payer: Self-pay | Admitting: Internal Medicine

## 2014-09-19 DIAGNOSIS — J9612 Chronic respiratory failure with hypercapnia: Secondary | ICD-10-CM

## 2014-09-19 NOTE — Telephone Encounter (Signed)
Called and spoke to pt. Pt is c/o chest tightness and "feels odd". Pt stated he feels he needs another unit of blood removed. Pt stated he is taking his lasix as prescribed.   Dr. Melvyn Novas please advise.

## 2014-09-19 NOTE — Telephone Encounter (Signed)
Pt is aware of MW's recs. Order will be placed for CBC. Pt will come tomorrow to have that drawn.

## 2014-09-19 NOTE — Telephone Encounter (Signed)
Needs to completely stop smoking Come over for cbc and phlebotomy if hct is > 55%

## 2014-09-20 ENCOUNTER — Other Ambulatory Visit (INDEPENDENT_AMBULATORY_CARE_PROVIDER_SITE_OTHER): Payer: Managed Care, Other (non HMO)

## 2014-09-20 ENCOUNTER — Telehealth: Payer: Self-pay | Admitting: Internal Medicine

## 2014-09-20 ENCOUNTER — Other Ambulatory Visit: Payer: Self-pay | Admitting: Medical Oncology

## 2014-09-20 DIAGNOSIS — D751 Secondary polycythemia: Secondary | ICD-10-CM

## 2014-09-20 DIAGNOSIS — J9612 Chronic respiratory failure with hypercapnia: Secondary | ICD-10-CM

## 2014-09-20 LAB — CBC
HCT: 58.3 % — ABNORMAL HIGH (ref 39.0–52.0)
Hemoglobin: 18.9 g/dL (ref 13.0–17.0)
MCHC: 32.3 g/dL (ref 30.0–36.0)
MCV: 84 fl (ref 78.0–100.0)
PLATELETS: 150 10*3/uL (ref 150.0–400.0)
RBC: 6.95 Mil/uL — ABNORMAL HIGH (ref 4.22–5.81)
RDW: 24.4 % — ABNORMAL HIGH (ref 11.5–15.5)
WBC: 7.7 10*3/uL (ref 4.0–10.5)

## 2014-09-20 NOTE — Telephone Encounter (Signed)
Pt is aware of MW's recommendation. ROV has been scheduled and order has been placed for phlebotomy.

## 2014-09-20 NOTE — Telephone Encounter (Signed)
LMTCB for the pt 

## 2014-09-20 NOTE — Telephone Encounter (Signed)
Will need a second unit pulled tomorrow then ov one week to regroup because clearly we haven't addressed the underlying problem effectively

## 2014-09-20 NOTE — Telephone Encounter (Signed)
Critical lab results called from the [facility] lab today by [facility].   The pt was seen by MW today and Hg was high at 18.9.  Pt is currently in the lab to have his phlebotomy done.  Will forward to MW to make aware of critical labs called to the floor.  Any further recs MW?  Thanks  No Known Allergies  Current Outpatient Prescriptions on File Prior to Visit  Medication Sig Dispense Refill  . albuterol (PROVENTIL HFA;VENTOLIN HFA) 108 (90 BASE) MCG/ACT inhaler Inhale 2 puffs into the lungs every 6 (six) hours as needed for wheezing or shortness of breath. 1 Inhaler 2  . carvedilol (COREG) 3.125 MG tablet Take 1 tablet (3.125 mg total) by mouth 2 (two) times daily with a meal. 60 tablet 0  . furosemide (LASIX) 40 MG tablet Take 1 tablet (40 mg total) by mouth daily. 30 tablet 0  . HYDROcodone-acetaminophen (NORCO) 7.5-325 MG per tablet Take 1 tablet by mouth every 6 (six) hours.    . Melatonin 3 MG TABS Take 6 mg by mouth at bedtime.    . Multiple Vitamins-Minerals (CENTRUM ADULTS) TABS Take 1 tablet by mouth daily.    . ranitidine (ZANTAC) 150 MG tablet Take 150 mg by mouth 2 (two) times daily.    Marland Kitchen testosterone cypionate (DEPOTESTOTERONE CYPIONATE) 100 MG/ML injection Inject into the muscle every 14 (fourteen) days. For IM use only     No current facility-administered medications on file prior to visit.

## 2014-09-21 ENCOUNTER — Other Ambulatory Visit: Payer: Managed Care, Other (non HMO)

## 2014-09-21 ENCOUNTER — Ambulatory Visit: Payer: Managed Care, Other (non HMO)

## 2014-09-21 ENCOUNTER — Ambulatory Visit: Payer: Managed Care, Other (non HMO) | Admitting: Internal Medicine

## 2014-09-23 ENCOUNTER — Other Ambulatory Visit (INDEPENDENT_AMBULATORY_CARE_PROVIDER_SITE_OTHER): Payer: Managed Care, Other (non HMO)

## 2014-09-23 ENCOUNTER — Other Ambulatory Visit: Payer: Self-pay | Admitting: Internal Medicine

## 2014-09-23 DIAGNOSIS — D751 Secondary polycythemia: Secondary | ICD-10-CM

## 2014-09-23 LAB — CBC WITH DIFFERENTIAL/PLATELET
BASOS ABS: 0 10*3/uL (ref 0.0–0.1)
Basophils Relative: 0.4 % (ref 0.0–3.0)
EOS PCT: 4.5 % (ref 0.0–5.0)
Eosinophils Absolute: 0.3 10*3/uL (ref 0.0–0.7)
HCT: 52.9 % — ABNORMAL HIGH (ref 39.0–52.0)
Hemoglobin: 17.6 g/dL — ABNORMAL HIGH (ref 13.0–17.0)
Lymphocytes Relative: 26.7 % (ref 12.0–46.0)
Lymphs Abs: 1.9 10*3/uL (ref 0.7–4.0)
MCHC: 33.3 g/dL (ref 30.0–36.0)
MCV: 82.9 fl (ref 78.0–100.0)
MONOS PCT: 14.5 % — AB (ref 3.0–12.0)
Monocytes Absolute: 1 10*3/uL (ref 0.1–1.0)
NEUTROS ABS: 3.9 10*3/uL (ref 1.4–7.7)
Neutrophils Relative %: 53.9 % (ref 43.0–77.0)
Platelets: 157 10*3/uL (ref 150.0–400.0)
RBC: 6.39 Mil/uL — ABNORMAL HIGH (ref 4.22–5.81)
RDW: 24.5 % — ABNORMAL HIGH (ref 11.5–15.5)
WBC: 7.3 10*3/uL (ref 4.0–10.5)

## 2014-09-25 ENCOUNTER — Encounter (HOSPITAL_BASED_OUTPATIENT_CLINIC_OR_DEPARTMENT_OTHER): Payer: Managed Care, Other (non HMO)

## 2014-09-29 ENCOUNTER — Ambulatory Visit: Payer: Managed Care, Other (non HMO) | Admitting: Internal Medicine

## 2014-10-12 ENCOUNTER — Ambulatory Visit (INDEPENDENT_AMBULATORY_CARE_PROVIDER_SITE_OTHER): Payer: Self-pay | Admitting: Pulmonary Disease

## 2014-10-12 ENCOUNTER — Encounter: Payer: Self-pay | Admitting: Pulmonary Disease

## 2014-10-12 DIAGNOSIS — G4730 Sleep apnea, unspecified: Secondary | ICD-10-CM

## 2014-10-12 NOTE — Patient Instructions (Signed)
Will increase your bilevel pressure to 16/12 for 3 weeks, then to 18/14 until next visit.  Let me know if your home care company does not do this. Work on weight loss We can re-evaluate your oxygen need at night once you are closer to your optimal pressure on your bipap. followup with me again in 8 weeks.

## 2014-10-12 NOTE — Assessment & Plan Note (Signed)
The patient has severe obstructive sleep apnea by his recent sleep study, and currently is on bilevel at a moderate pressure and seems to be tolerating it quite well. His download shows breakthrough events as expected, but I think we need to be careful increasing his pressure so that we maintain adequate compliance. Will do a stepwise approach over the next 8 weeks, and he will follow-up with me at that time. I have stressed to him the importance of staying on his bilevel device, and how his severe sleep apnea affects his cardiovascular health. I have also encouraged him to work aggressively on weight loss.

## 2014-10-12 NOTE — Progress Notes (Signed)
Subjective:    Patient ID: Philip Powell, male    DOB: 22-Sep-1950, 64 y.o.   MRN: [id number]  HPI The patient is a 64 year old male who I've been asked to see for management of obstructive sleep apnea. He recently had a split night study where he was found to have an AHI of 72 events per hour, and started on C Pap with poor control of his events. He was changed over to bilevel, and titrated as high as 23/17, but still had some breakthrough events. He also required oxygen with his bilevel at 2-3 L/m. The patient also has ongoing issues with pulmonary hypertension and cor pulmonale, as well as polycythemia. Since being on the bilevel, he clearly sees that he is sleeping better, but has not seen a huge difference in his alertness or energy level during the night. However, his download shows fairly good compliance but continues to have breakthrough events as expected. He feels that his mask is comfortable, and he has not having any significant mask leaks on his download. The patient's Epworth score today is 15   Sleep Questionnaire What time do you typically go to bed?( Between what hours) 1am 1am at 1022 on 10/12/14 by Inge Rise, CMA How long does it take you to fall asleep? 10-15 min 10-15 min at 1022 on 10/12/14 by Inge Rise, CMA How many times during the night do you wake up? 2 2 at 1022 on 10/12/14 by Inge Rise, [place] What time do you get out of bed to start your day? 0900 0900 at 1022 on 10/12/14 by Inge Rise, CMA Do you drive or operate heavy machinery in your occupation? No No at 1022 on 10/12/14 by Inge Rise, CMA How much has your weight changed (up or down) over the past two years? (In pounds) 0 oz (0 kg) 0 oz (0 kg) at 1022 on 10/12/14 by Inge Rise, CMA Have you ever had a sleep study before? Yes Yes at 1022 on 10/12/14 by Inge Rise, CMA If yes, [facility] on 10/12/14 by Inge Rise, CMA If yes, date of study?  07/2014 07/2014 at 1022 on 10/12/14 by Inge Rise, CMA Do you currently use CPAP? Yes Yes at 1022 on 10/12/14 by Inge Rise, CMA If so, what pressure? 14/10 14/10 at 1022 on 10/12/14 by Inge Rise, CMA Do you wear oxygen at any time? Yes Yes at 1022 on 10/12/14 by Inge Rise, CMA O2 Flow Rate (L/min) 3 L/min 3 L/min at 1056 on 08/22/14 by Rosana Berger, CMA 2 L/min 2 L/min at 1022 on 10/12/14 by Inge Rise, CMA   Review of Systems  Constitutional: Negative for fever and unexpected weight change.  HENT: Negative for congestion, dental problem, ear pain, nosebleeds, postnasal drip, rhinorrhea, sinus pressure, sneezing, sore throat and trouble swallowing.   Eyes: Negative for redness and itching.  Respiratory: Positive for shortness of breath. Negative for cough, chest tightness and wheezing.   Cardiovascular: Negative for palpitations and leg swelling.  Gastrointestinal: Negative for nausea and vomiting.  Genitourinary: Negative for dysuria.  Musculoskeletal: Negative for joint swelling.  Skin: Negative for rash.  Neurological: Negative for headaches.  Hematological: Does not bruise/bleed easily.  Psychiatric/Behavioral: Negative for dysphoric mood. The patient is not nervous/anxious.        Objective:   Physical Exam Constitutional:  Obese male, no acute distress  HENT:  Nares patent without  discharge  Oropharynx without exudate, palate and uvula are thick and elongated.  Eyes:  Perrla, eomi, no scleral icterus  Neck:  No JVD, no TMG  Cardiovascular:  Normal rate, regular rhythm, no rubs or gallops.  No murmurs        Intact distal pulses but decreased.  Pulmonary :  Normal breath sounds, no stridor or respiratory distress   No rales, rhonchi, or wheezing  Abdominal:  Soft, nondistended, bowel sounds present.  No tenderness noted.   Musculoskeletal:  1-2+  lower extremity edema noted.  Lymph Nodes:  No cervical lymphadenopathy noted  Skin:  No  cyanosis noted  Neurologic:  Alert, appropriate, moves all 4 extremities without obvious deficit.          Assessment & Plan:

## 2014-10-13 ENCOUNTER — Ambulatory Visit: Payer: Managed Care, Other (non HMO)

## 2014-10-13 ENCOUNTER — Encounter: Payer: Self-pay | Admitting: Internal Medicine

## 2014-10-13 ENCOUNTER — Telehealth: Payer: Self-pay | Admitting: Oncology

## 2014-10-13 ENCOUNTER — Ambulatory Visit (HOSPITAL_BASED_OUTPATIENT_CLINIC_OR_DEPARTMENT_OTHER): Payer: Managed Care, Other (non HMO)

## 2014-10-13 ENCOUNTER — Telehealth: Payer: Self-pay | Admitting: *Deleted

## 2014-10-13 ENCOUNTER — Ambulatory Visit (HOSPITAL_BASED_OUTPATIENT_CLINIC_OR_DEPARTMENT_OTHER): Payer: Managed Care, Other (non HMO) | Admitting: Internal Medicine

## 2014-10-13 VITALS — BP 127/73 | HR 76 | Temp 98.5°F | Resp 18 | Ht 72.0 in | Wt 306.9 lb

## 2014-10-13 DIAGNOSIS — Z807 Family history of other malignant neoplasms of lymphoid, hematopoietic and related tissues: Secondary | ICD-10-CM

## 2014-10-13 DIAGNOSIS — Z720 Tobacco use: Secondary | ICD-10-CM

## 2014-10-13 DIAGNOSIS — D751 Secondary polycythemia: Secondary | ICD-10-CM

## 2014-10-13 DIAGNOSIS — Z808 Family history of malignant neoplasm of other organs or systems: Secondary | ICD-10-CM

## 2014-10-13 LAB — CBC WITH DIFFERENTIAL/PLATELET
BASO%: 0.9 % (ref 0.0–2.0)
BASOS ABS: 0.1 10*3/uL (ref 0.0–0.1)
EOS%: 3.3 % (ref 0.0–7.0)
Eosinophils Absolute: 0.3 10*3/uL (ref 0.0–0.5)
HCT: 55.7 % — ABNORMAL HIGH (ref 38.4–49.9)
HEMOGLOBIN: 18.2 g/dL — AB (ref 13.0–17.1)
LYMPH%: 23.9 % (ref 14.0–49.0)
MCH: 28.9 pg (ref 27.2–33.4)
MCHC: 32.7 g/dL (ref 32.0–36.0)
MCV: 88.6 fL (ref 79.3–98.0)
MONO#: 0.9 10*3/uL (ref 0.1–0.9)
MONO%: 11.4 % (ref 0.0–14.0)
NEUT#: 4.8 10*3/uL (ref 1.5–6.5)
NEUT%: 60.5 % (ref 39.0–75.0)
PLATELETS: 168 10*3/uL (ref 140–400)
RBC: 6.29 10*6/uL — ABNORMAL HIGH (ref 4.20–5.82)
RDW: 19.9 % — ABNORMAL HIGH (ref 11.0–14.6)
WBC: 8 10*3/uL (ref 4.0–10.3)
lymph#: 1.9 10*3/uL (ref 0.9–3.3)
nRBC: 0 % (ref 0–0)

## 2014-10-13 LAB — COMPREHENSIVE METABOLIC PANEL (CC13)
ALK PHOS: 60 U/L (ref 40–150)
ALT: 30 U/L (ref 0–55)
AST: 32 U/L (ref 5–34)
Albumin: 4.1 g/dL (ref 3.5–5.0)
Anion Gap: 10 mEq/L (ref 3–11)
BUN: 11.3 mg/dL (ref 7.0–26.0)
CHLORIDE: 104 meq/L (ref 98–109)
CO2: 25 mEq/L (ref 22–29)
CREATININE: 0.7 mg/dL (ref 0.7–1.3)
Calcium: 9.4 mg/dL (ref 8.4–10.4)
Glucose: 96 mg/dl (ref 70–140)
POTASSIUM: 4.5 meq/L (ref 3.5–5.1)
Sodium: 139 mEq/L (ref 136–145)
Total Bilirubin: 1.32 mg/dL — ABNORMAL HIGH (ref 0.20–1.20)
Total Protein: 7.2 g/dL (ref 6.4–8.3)

## 2014-10-13 LAB — LACTATE DEHYDROGENASE (CC13): LDH: 192 U/L (ref 125–245)

## 2014-10-13 LAB — TECHNOLOGIST REVIEW

## 2014-10-13 NOTE — Patient Instructions (Signed)
Smoking Cessation, Tips for Success  If you are ready to quit smoking, congratulations! You have chosen to help yourself be healthier. Cigarettes bring nicotine, tar, carbon monoxide, and other irritants into your body. Your lungs, heart, and blood vessels will be able to work better without these poisons. There are many different ways to quit smoking. Nicotine gum, nicotine patches, a nicotine inhaler, or nicotine nasal spray can help with physical craving. Hypnosis, support groups, and medicines help break the habit of smoking.  WHAT THINGS CAN I DO TO MAKE QUITTING EASIER?   Here are some tips to help you quit for good:  · Pick a date when you will quit smoking completely. Tell all of your friends and family about your plan to quit on that date.  · Do not try to slowly cut down on the number of cigarettes you are smoking. Pick a quit date and quit smoking completely starting on that day.  · Throw away all cigarettes.    · Clean and remove all ashtrays from your home, work, and car.  · On a card, write down your reasons for quitting. Carry the card with you and read it when you get the urge to smoke.  · Cleanse your body of nicotine. Drink enough water and fluids to keep your urine clear or pale yellow. Do this after quitting to flush the nicotine from your body.  · Learn to predict your moods. Do not let a bad situation be your excuse to have a cigarette. Some situations in your life might tempt you into wanting a cigarette.  · Never have "just one" cigarette. It leads to wanting another and another. Remind yourself of your decision to quit.  · Change habits associated with smoking. If you smoked while driving or when feeling stressed, try other activities to replace smoking. Stand up when drinking your coffee. Brush your teeth after eating. Sit in a different chair when you read the paper. Avoid alcohol while trying to quit, and try to drink fewer caffeinated beverages. Alcohol and caffeine may urge you to  smoke.  · Avoid foods and drinks that can trigger a desire to smoke, such as sugary or spicy foods and alcohol.  · Ask people who smoke not to smoke around you.  · Have something planned to do right after eating or having a cup of coffee. For example, plan to take a walk or exercise.  · Try a relaxation exercise to calm you down and decrease your stress. Remember, you may be tense and nervous for the first 2 weeks after you quit, but this will pass.  · Find new activities to keep your hands busy. Play with a pen, coin, or rubber band. Doodle or draw things on paper.  · Brush your teeth right after eating. This will help cut down on the craving for the taste of tobacco after meals. You can also try mouthwash.    · Use oral substitutes in place of cigarettes. Try using lemon drops, carrots, cinnamon sticks, or chewing gum. Keep them handy so they are available when you have the urge to smoke.  · When you have the urge to smoke, try deep breathing.  · Designate your home as a nonsmoking area.  · If you are a heavy smoker, ask your health care provider about a prescription for nicotine chewing gum. It can ease your withdrawal from nicotine.  · Reward yourself. Set aside the cigarette money you save and buy yourself something nice.  · Look for   support from others. Join a support group or smoking cessation program. Ask someone at home or at work to help you with your plan to quit smoking.  · Always ask yourself, "Do I need this cigarette or is this just a reflex?" Tell yourself, "Today, I choose not to smoke," or "I do not want to smoke." You are reminding yourself of your decision to quit.  · Do not replace cigarette smoking with electronic cigarettes (commonly called e-cigarettes). The safety of e-cigarettes is unknown, and some may contain harmful chemicals.  · If you relapse, do not give up! Plan ahead and think about what you will do the next time you get the urge to smoke.  HOW WILL I FEEL WHEN I QUIT SMOKING?  You  may have symptoms of withdrawal because your body is used to nicotine (the addictive substance in cigarettes). You may crave cigarettes, be irritable, feel very hungry, cough often, get headaches, or have difficulty concentrating. The withdrawal symptoms are only temporary. They are strongest when you first quit but will go away within 10-14 days. When withdrawal symptoms occur, stay in control. Think about your reasons for quitting. Remind yourself that these are signs that your body is healing and getting used to being without cigarettes. Remember that withdrawal symptoms are easier to treat than the major diseases that smoking can cause.   Even after the withdrawal is over, expect periodic urges to smoke. However, these cravings are generally short lived and will go away whether you smoke or not. Do not smoke!  WHAT RESOURCES ARE AVAILABLE TO HELP ME QUIT SMOKING?  Your health care provider can direct you to community resources or hospitals for support, which may include:  · Group support.  · Education.  · Hypnosis.  · Therapy.  Document Released: 04/12/2004 Document Revised: 11/29/2013 Document Reviewed: 12/31/2012  Exit[employer]® Patient Information ©2015 [employer]. This information is not intended to replace advice given to you by your health care provider. Make sure you discuss any questions you have with your health care provider.

## 2014-10-13 NOTE — Telephone Encounter (Signed)
Pt confirmed labs/ov per 03/17 POF, gave pt AVS and calendar.... KJ, sent msg to add Phlebotomy

## 2014-10-13 NOTE — Telephone Encounter (Signed)
Per staff message and POF I have scheduled appts. Advised scheduler of appts. JMW  

## 2014-10-13 NOTE — Progress Notes (Signed)
[place] [phone]   [phone]  CONSULT NOTE  REFERRING PHYSICIAN: Dr. Octavio Graves  REASON FOR CONSULTATION:  64 years old white male with persistent polycythemia  HPI Philip Powell is a 64 y.o. male was past medical history significant for sleep apnea, cor pulmonale, congestive heart failure, pulmonary hypertension, obesity as well as long history of smoking. The patient was treated in November 2015 for congestive heart failure. He was noted on several previous bloodwork to have significant elevation of his hemoglobin and hematocrit. On 06/16/2014 his hemoglobin was 20.7 and hematocrit 66.9%. The patient had several phlebotomies in the past but not recently. The last one was over 1 year ago. He was referred to me today for evaluation and recommendation regarding treatment of his condition. The patient feels fine except for the baseline shortness of breath and he feels congested and stuffed when his hemoglobin goes up. He denied having any significant chest pain, shortness breath or hemoptysis. The patient denied having any significant weight loss but he has occasional episodes of night sweats. 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 He is not currently on any iron supplements but he eats red meat at regular basis. He denied having any significant nausea, vomiting, change in bowel movement. The patient denied having any headache or blurry vision. He has no bleeding issues. His family history significant for mother with renal cancer, father had lymphoma, brother had myeloma and another brother with lymphoma. The patient is married and has 3 children. He works as Corporate treasurer. He has a history of smoking 1 pack per day for around 48 years and unfortunately he continues to smoke. He also drinks alcohol occasionally. He has no history of drug  abuse.  HPI  Past Medical History  Diagnosis Date  . Essential hypertension   . COPD (chronic obstructive pulmonary disease)   . Polycythemia   . Obstructive sleep apnea   . Obesity   . History of pneumonia   . Cor pulmonale, chronic     Past Surgical History  Procedure Laterality Date  . Carpal tunnel release  2008    Family History  Problem Relation Age of Onset  . Cancer Brother   . Heart disease Maternal Grandfather   . Cancer Mother   . Cancer Father     Social History History  Substance Use Topics  . Smoking status: Current Some Day Smoker -- 1.00 packs/day for 43 years    Types: Cigarettes  . Smokeless tobacco: Never Used     Comment: 0.5  . Alcohol Use: 0.0 oz/week    0 Standard drinks or equivalent per week     Comment: occasionally    No Known Allergies  Current Outpatient Prescriptions  Medication Sig Dispense Refill  . aspirin 325 MG tablet Take 325 mg by mouth daily.    . carvedilol (COREG) 3.125 MG tablet Take 1 tablet (3.125 mg total) by mouth 2 (two) times daily with a meal. 60 tablet 0  . furosemide (LASIX) 40 MG tablet Take 1 tablet (40 mg total) by mouth daily. 30 tablet 0  . HYDROcodone-acetaminophen (NORCO) 7.5-325 MG per tablet Take 1 tablet by mouth every 6 (six) hours.    . Melatonin 3 MG TABS Take 6 mg by mouth at bedtime.    . Multiple Vitamins-Minerals (CENTRUM ADULTS) TABS Take 1 tablet by mouth daily.    Marland Kitchen  ranitidine (ZANTAC) 150 MG tablet Take 150 mg by mouth 2 (two) times daily.    Marland Kitchen testosterone cypionate (DEPOTESTOTERONE CYPIONATE) 100 MG/ML injection Inject into the muscle every 14 (fourteen) days. For IM use only    . albuterol (PROVENTIL HFA;VENTOLIN HFA) 108 (90 BASE) MCG/ACT inhaler Inhale 2 puffs into the lungs every 6 (six) hours as needed for wheezing or shortness of breath. (Patient not taking: Reported on 10/13/2014) 1 Inhaler 2   No current facility-administered medications for this visit.    Review of  Systems  Constitutional: positive for night sweats Eyes: negative Ears, nose, mouth, throat, and face: negative Respiratory: positive for dyspnea on exertion Cardiovascular: negative Gastrointestinal: negative Genitourinary:negative Integument/breast: negative Hematologic/lymphatic: negative Musculoskeletal:negative Neurological: negative Behavioral/Psych: negative Endocrine: negative Allergic/Immunologic: negative  Physical Exam  MGQ:QPYPP, healthy, no distress, well nourished and well developed SKIN: skin color, texture, turgor are normal, no rashes or significant lesions HEAD: Normocephalic, No masses, lesions, tenderness or abnormalities EYES: normal, PERRLA, Conjunctiva are pink and non-injected EARS: External ears normal, Canals clear OROPHARYNX:no exudate, no erythema and lips, buccal mucosa, and tongue normal  NECK: supple, no adenopathy, no JVD LYMPH:  no palpable lymphadenopathy, no hepatosplenomegaly LUNGS: clear to auscultation , and palpation HEART: regular rate & rhythm, no murmurs and no gallops ABDOMEN:abdomen soft, non-tender, obese, normal bowel sounds and no masses or organomegaly BACK: Back symmetric, no curvature., No CVA tenderness EXTREMITIES:no joint deformities, effusion, or inflammation, no edema, no skin discoloration  NEURO: alert & oriented x 3 with fluent speech, no focal motor/sensory deficits  PERFORMANCE STATUS: ECOG 1  LABORATORY DATA: Lab Results  Component Value Date   WBC 7.3 09/23/2014   HGB 17.6* 09/23/2014   HCT 52.9* 09/23/2014   MCV 82.9 09/23/2014   PLT 157.0 09/23/2014      Chemistry      Component Value Date/Time   NA 138 07/25/2014 1126   K 4.7 07/25/2014 1126   CL 97 07/25/2014 1126   CO2 34* 07/25/2014 1126   BUN 10 07/25/2014 1126   CREATININE 0.8 07/25/2014 1126      Component Value Date/Time   CALCIUM 8.9 07/25/2014 1126   ALKPHOS 60 06/10/2014 1518   AST 57* 06/10/2014 1518   ALT 31 06/10/2014 1518    BILITOT 1.2 06/10/2014 1518       RADIOGRAPHIC STUDIES: No results found.  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   PLAN: I had a lengthy discussion with the patient today about his current condition and treatment options. I will order a few studies to rule out the possibility of polycythemia vera including repeat CBC, comprehensive metabolic panel as well as serum erythropoietin and JAK- 2 mutation. I also recommended for the patient to proceed with phlebotomy to keep his hematocrit around 45%. He will receive the first phlebotomy today. I will see the patient back for follow-up visit in 2 weeks for reevaluation and discussion of his pending lab results as well as repeat phlebotomy as needed. For smoke cessation, I strongly encouraged the patient to quit smoking and offered him a smoke cessation program. I gave the patient the time to ask questions and I answered them completely to his satisfaction. He was advised to call immediately if he has any concerning symptoms  in the interval.  The patient voices understanding of current disease status and treatment options and is in agreement with the current care plan.  All questions were answered. The patient knows to call the clinic with any problems, questions or concerns. We can certainly see the patient much sooner if necessary.  Thank you so much for allowing me to participate in the care of [place]. I will continue to follow up the patient with you and assist in his care.  I spent 40 minutes counseling the patient face to face. The total time spent in the appointment was 60 minutes.  Disclaimer: This note was dictated with voice recognition software. Similar sounding words  can inadvertently be transcribed and may not be corrected upon review.   Tiamarie Furnari K. October 13, 2014, 12:47 PM

## 2014-10-13 NOTE — Progress Notes (Signed)
Checked in new pt with no financial concerns at this time.  He has my card for any billing questions or concerns.

## 2014-10-13 NOTE — Patient Instructions (Signed)

## 2014-10-17 LAB — ERYTHROPOIETIN: Erythropoietin: 8 m[IU]/mL (ref 2.6–18.5)

## 2014-10-18 ENCOUNTER — Other Ambulatory Visit: Payer: Managed Care, Other (non HMO)

## 2014-10-18 NOTE — Telephone Encounter (Addendum)
Lft msg for pt confirming labs/ov and Phlebotomy added to schedule, mailed schedule to pt.... KJ, sent msg to MD approval for date due to no availability also approved for pt later than 2 wks due to pt's work schedule and needs Mon/Thurs only... KJ

## 2014-10-19 ENCOUNTER — Ambulatory Visit: Payer: Managed Care, Other (non HMO) | Admitting: Oncology

## 2014-10-27 ENCOUNTER — Telehealth: Payer: Self-pay | Admitting: Internal Medicine

## 2014-10-27 NOTE — Telephone Encounter (Signed)
returned pt call no vm just kept ringing.

## 2014-10-31 ENCOUNTER — Other Ambulatory Visit: Payer: Managed Care, Other (non HMO)

## 2014-10-31 ENCOUNTER — Ambulatory Visit: Payer: Managed Care, Other (non HMO) | Admitting: Physician Assistant

## 2014-11-04 ENCOUNTER — Encounter: Payer: Self-pay | Admitting: Oncology

## 2014-11-04 ENCOUNTER — Ambulatory Visit (HOSPITAL_BASED_OUTPATIENT_CLINIC_OR_DEPARTMENT_OTHER): Payer: Managed Care, Other (non HMO) | Admitting: Oncology

## 2014-11-04 ENCOUNTER — Ambulatory Visit (HOSPITAL_BASED_OUTPATIENT_CLINIC_OR_DEPARTMENT_OTHER): Payer: Managed Care, Other (non HMO)

## 2014-11-04 ENCOUNTER — Telehealth: Payer: Self-pay | Admitting: Internal Medicine

## 2014-11-04 ENCOUNTER — Other Ambulatory Visit (HOSPITAL_BASED_OUTPATIENT_CLINIC_OR_DEPARTMENT_OTHER): Payer: Managed Care, Other (non HMO)

## 2014-11-04 VITALS — BP 136/72 | HR 72 | Temp 99.1°F | Resp 18 | Ht 72.0 in | Wt 306.1 lb

## 2014-11-04 DIAGNOSIS — D751 Secondary polycythemia: Secondary | ICD-10-CM

## 2014-11-04 DIAGNOSIS — I2781 Cor pulmonale (chronic): Secondary | ICD-10-CM

## 2014-11-04 DIAGNOSIS — Z720 Tobacco use: Secondary | ICD-10-CM | POA: Diagnosis not present

## 2014-11-04 LAB — CBC WITH DIFFERENTIAL/PLATELET
BASO%: 1.3 % (ref 0.0–2.0)
Basophils Absolute: 0.1 10*3/uL (ref 0.0–0.1)
EOS ABS: 0.3 10*3/uL (ref 0.0–0.5)
EOS%: 3.9 % (ref 0.0–7.0)
HCT: 54 % — ABNORMAL HIGH (ref 38.4–49.9)
HGB: 17.4 g/dL — ABNORMAL HIGH (ref 13.0–17.1)
LYMPH%: 24.5 % (ref 14.0–49.0)
MCH: 28.5 pg (ref 27.2–33.4)
MCHC: 32.2 g/dL (ref 32.0–36.0)
MCV: 88.6 fL (ref 79.3–98.0)
MONO#: 1.4 10*3/uL — AB (ref 0.1–0.9)
MONO%: 15.1 % — AB (ref 0.0–14.0)
NEUT#: 5 10*3/uL (ref 1.5–6.5)
NEUT%: 55.2 % (ref 39.0–75.0)
Platelets: 164 10*3/uL (ref 140–400)
RBC: 6.1 10*6/uL — ABNORMAL HIGH (ref 4.20–5.82)
RDW: 19 % — AB (ref 11.0–14.6)
WBC: 9 10*3/uL (ref 4.0–10.3)
lymph#: 2.2 10*3/uL (ref 0.9–3.3)

## 2014-11-04 NOTE — Progress Notes (Signed)
No images are attached to the encounter. No scans are attached to the encounter. No scans are attached to the encounter. 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 JAK-2 mutation was negative.  PRIOR THERAPY: He has received phlebotomies intermittently.  CURRENT THERAPY: Phlebotomy every 2 weeks for hematocrit greater than 45%.  INTERVAL HISTORY: Philip Powell Philip Powell 64 y.o. male returns for a routine visit for followup of persistent polycythemia. He received a phlebotomy approximately 3 weeks ago and reported that he felt significantly better following the phlebotomy. He reports that his breathing is better. He remains on oxygen at 2 L/m. He is also on BiPAP. He denies having any significant chest pain, hemoptysis, or weight loss. He denies having any significant nausea, vomiting, change in his bowel habits. Denies headaches and blurry vision. Denies bleeding issues. He continues to smoke approximately one half pack per day.  MEDICAL HISTORY: Past Medical History  Diagnosis Date  . Essential hypertension   . COPD (chronic obstructive pulmonary disease)   . Polycythemia   . Obstructive sleep apnea   . Obesity   . History of pneumonia   . Cor pulmonale, chronic     ALLERGIES:  has No Known Allergies.  MEDICATIONS:  Current Outpatient Prescriptions  Medication Sig Dispense Refill  . albuterol (PROVENTIL HFA;VENTOLIN HFA) 108 (90 BASE) MCG/ACT inhaler Inhale 2 puffs into the lungs every 6 (six) hours as needed for wheezing or shortness of breath. 1 Inhaler 2  . aspirin 325 MG tablet Take 325 mg by mouth daily.    . carvedilol (COREG) 3.125 MG tablet Take 1 tablet (3.125 mg total) by mouth 2 (two) times daily with a meal.  60 tablet 0  . furosemide (LASIX) 40 MG tablet Take 1 tablet (40 mg total) by mouth daily. 30 tablet 0  . HYDROcodone-acetaminophen (NORCO) 7.5-325 MG per tablet Take 1 tablet by mouth every 6 (six) hours.    . Melatonin 3 MG TABS Take 6 mg by mouth at bedtime.    . Multiple Vitamins-Minerals (CENTRUM ADULTS) TABS Take 1 tablet by mouth daily.    . ranitidine (ZANTAC) 150 MG tablet Take 150 mg by mouth 2 (two) times daily.    Marland Kitchen testosterone cypionate (DEPOTESTOTERONE CYPIONATE) 100 MG/ML injection Inject into the muscle every 14 (fourteen) days. For IM use only     No current facility-administered medications for this visit.    SURGICAL HISTORY:  Past Surgical History  Procedure Laterality Date  . Carpal tunnel release  2008    REVIEW OF SYSTEMS:  Review of Systems  Constitutional: Negative.   HENT: Negative.   Eyes: Negative.   Respiratory: Negative for cough, hemoptysis, sputum production, shortness of breath and wheezing.        Wears O2 @ 2L/min. Wears Bi PAP.   Cardiovascular: Negative.   Gastrointestinal: Negative.   Genitourinary: Negative.   Musculoskeletal: Negative.   Skin: Negative.   Neurological: Negative.   Endo/Heme/Allergies: Negative.   Psychiatric/Behavioral: Negative.      PHYSICAL EXAMINATION: Physical Exam  Constitutional: He is oriented to person, place, and time and well-developed, well-nourished, and in no distress.  HENT:  Head: Normocephalic.  Mouth/Throat: Oropharynx is clear and moist.  Eyes: Conjunctivae and EOM are normal. Pupils are equal,  round, and reactive to light.  Neck: Neck supple. No tracheal deviation present. No thyromegaly present.  Cardiovascular: Normal rate, regular rhythm, normal heart sounds and intact distal pulses.   No murmur heard. Pulmonary/Chest: Effort normal and breath sounds normal. No respiratory distress. He has no wheezes.  Abdominal: Soft. Bowel sounds are normal. He exhibits no distension and no mass. There is no  tenderness.  Musculoskeletal: Normal range of motion. He exhibits no edema.  Neurological: He is alert and oriented to person, place, and time. Gait normal.  Skin: Skin is warm and dry. No rash noted.  Psychiatric: Mood, memory, affect and judgment normal.    ECOG PERFORMANCE STATUS: 1 - Symptomatic but completely ambulatory  Blood pressure 136/72, pulse 72, temperature 99.1 F (37.3 C), temperature source Oral, resp. rate 18, height 6' (1.829 m), weight 306 lb 1.6 oz (138.846 kg), SpO2 97 %.  LABORATORY DATA: Lab Results  Component Value Date   WBC 9.0 11/04/2014   HGB 17.4* 11/04/2014   HCT 54.0* 11/04/2014   MCV 88.6 11/04/2014   PLT 164 11/04/2014      Chemistry      Component Value Date/Time   NA 139 10/13/2014 1302   NA 138 07/25/2014 1126   K 4.5 10/13/2014 1302   K 4.7 07/25/2014 1126   CL 97 07/25/2014 1126   CO2 25 10/13/2014 1302   CO2 34* 07/25/2014 1126   BUN 11.3 10/13/2014 1302   BUN 10 07/25/2014 1126   CREATININE 0.7 10/13/2014 1302   CREATININE 0.8 07/25/2014 1126      Component Value Date/Time   CALCIUM 9.4 10/13/2014 1302   CALCIUM 8.9 07/25/2014 1126   ALKPHOS 60 10/13/2014 1302   ALKPHOS 60 06/10/2014 1518   AST 32 10/13/2014 1302   AST 57* 06/10/2014 1518   ALT 30 10/13/2014 1302   ALT 31 06/10/2014 1518   BILITOT 1.32* 10/13/2014 1302   BILITOT 1.2 06/10/2014 1518       RADIOGRAPHIC STUDIES:  No results found.   ASSESSMENT/PLAN:  No problem-specific assessment & plan notes found for this encounter.  this is a 64 year old gentleman with persistent polycythemia. 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 His serum erythropoietin level was normal and JAK-2 mutation was negative.  The patient was seen and examined with Dr. Julien Nordmann. Lab results reviewed with the patient. Recommend that he receive  phlebotomy every 2 weeks to keep his hematocrit around 45%. His symptoms were improved following the last phlebotomy. He will proceed with phlebotomy today and then return in 2 weeks for lab and possible phlebotomy. He will have a return visit in approximately one month with potential phlebotomy on that day as well. Once his hematocrit improves to approximate 45%, we will decrease the frequency of his phlebotomies.  The patient expresses understanding of his current disease status and treatment options and is in agreement with the current plan.  The patient knows to call the clinic with any problems, questions or concerns. He can certainly see the patient much sooner if necessary.  Philip Bussing, DNP, AGPCNP-BC, AOCNP 11/04/2014  ADDENDUM: Hematology/Oncology Attending:  I had a face to face encounter with the patient. I recommended his care plan. This is a very pleasant 64 years old white male with persistent polycythemia highly suspicious for reactive erythrocytosis with negative JAK-2 mutation and continuous treatment with androgen. He underwent phlebotomy  2 weeks ago and tolerated it fairly well. He has some improvement in his hemoglobin and hematocrit but his hematocrit is still elevated at 54.0%.  I recommended for the patient to continue phlebotomy every 2 weeks. He would come back for follow-up visit in 4 weeks for reevaluation and repeat CBC. He was advised to call immediately if he has any concerning symptoms in the interval.  Disclaimer: This Powell was dictated with voice recognition software. Similar sounding words can inadvertently be transcribed and may be missed upon review. Eilleen Kempf., MD 11/06/2014

## 2014-11-04 NOTE — Telephone Encounter (Signed)
Gave and printed appt sched and avs for pt for April and May....sed Added tx.

## 2014-11-04 NOTE — Patient Instructions (Signed)

## 2014-11-04 NOTE — Progress Notes (Signed)
0935:  Pt states he has not eaten anything this morning; refusing offer of snacks prior to start of phlebotomy. Pt states he does fine with phlebotomy and has no adverse side effects. Amy Horton, charge RN notified, okay to proceed. Therapeutic phlebotomy performed per order using phlebotomy set from right AC. 500 grams removed without difficulty over approx 8 minutes. Pt tolerated procedure well.  Snacks again offered and pt refused. States he has granola bars in his pocket and will eat those.  Pt refusing to stay for 30 minute post observation.  Pt encouraged to stay, again refused.  Vital signs obtained and stable. Pt discharged, ambulatory and in no apparent distress.

## 2014-11-18 ENCOUNTER — Ambulatory Visit: Payer: Managed Care, Other (non HMO)

## 2014-11-18 ENCOUNTER — Other Ambulatory Visit (HOSPITAL_BASED_OUTPATIENT_CLINIC_OR_DEPARTMENT_OTHER): Payer: Managed Care, Other (non HMO)

## 2014-11-18 DIAGNOSIS — D751 Secondary polycythemia: Secondary | ICD-10-CM | POA: Diagnosis not present

## 2014-11-18 LAB — CBC WITH DIFFERENTIAL/PLATELET
BASO%: 0.8 % (ref 0.0–2.0)
Basophils Absolute: 0.1 10*3/uL (ref 0.0–0.1)
EOS%: 4.8 % (ref 0.0–7.0)
Eosinophils Absolute: 0.4 10*3/uL (ref 0.0–0.5)
HCT: 52.6 % — ABNORMAL HIGH (ref 38.4–49.9)
HGB: 17.1 g/dL (ref 13.0–17.1)
LYMPH%: 23.1 % (ref 14.0–49.0)
MCH: 29.1 pg (ref 27.2–33.4)
MCHC: 32.6 g/dL (ref 32.0–36.0)
MCV: 89.2 fL (ref 79.3–98.0)
MONO#: 1.1 10*3/uL — ABNORMAL HIGH (ref 0.1–0.9)
MONO%: 12.7 % (ref 0.0–14.0)
NEUT#: 4.9 10*3/uL (ref 1.5–6.5)
NEUT%: 58.6 % (ref 39.0–75.0)
PLATELETS: 167 10*3/uL (ref 140–400)
RBC: 5.89 10*6/uL — ABNORMAL HIGH (ref 4.20–5.82)
RDW: 17.3 % — ABNORMAL HIGH (ref 11.0–14.6)
WBC: 8.4 10*3/uL (ref 4.0–10.3)
lymph#: 1.9 10*3/uL (ref 0.9–3.3)

## 2014-12-02 ENCOUNTER — Ambulatory Visit (HOSPITAL_BASED_OUTPATIENT_CLINIC_OR_DEPARTMENT_OTHER): Payer: Managed Care, Other (non HMO)

## 2014-12-02 ENCOUNTER — Ambulatory Visit (HOSPITAL_BASED_OUTPATIENT_CLINIC_OR_DEPARTMENT_OTHER): Payer: Managed Care, Other (non HMO) | Admitting: Nurse Practitioner

## 2014-12-02 ENCOUNTER — Encounter: Payer: Self-pay | Admitting: Nurse Practitioner

## 2014-12-02 ENCOUNTER — Other Ambulatory Visit (HOSPITAL_BASED_OUTPATIENT_CLINIC_OR_DEPARTMENT_OTHER): Payer: Managed Care, Other (non HMO)

## 2014-12-02 ENCOUNTER — Telehealth: Payer: Self-pay | Admitting: Nurse Practitioner

## 2014-12-02 VITALS — BP 122/79 | HR 83 | Temp 98.1°F | Resp 20 | Wt 306.9 lb

## 2014-12-02 VITALS — BP 129/81 | HR 75 | Temp 97.3°F | Resp 20

## 2014-12-02 DIAGNOSIS — I2781 Cor pulmonale (chronic): Secondary | ICD-10-CM

## 2014-12-02 DIAGNOSIS — D751 Secondary polycythemia: Secondary | ICD-10-CM | POA: Diagnosis not present

## 2014-12-02 DIAGNOSIS — G4730 Sleep apnea, unspecified: Secondary | ICD-10-CM

## 2014-12-02 DIAGNOSIS — Z720 Tobacco use: Secondary | ICD-10-CM

## 2014-12-02 LAB — CBC WITH DIFFERENTIAL/PLATELET
BASO%: 0.5 % (ref 0.0–2.0)
Basophils Absolute: 0.1 10*3/uL (ref 0.0–0.1)
EOS ABS: 0.4 10*3/uL (ref 0.0–0.5)
EOS%: 3.9 % (ref 0.0–7.0)
HCT: 50.2 % — ABNORMAL HIGH (ref 38.4–49.9)
HGB: 16.5 g/dL (ref 13.0–17.1)
LYMPH#: 2.4 10*3/uL (ref 0.9–3.3)
LYMPH%: 26.1 % (ref 14.0–49.0)
MCH: 30.4 pg (ref 27.2–33.4)
MCHC: 32.9 g/dL (ref 32.0–36.0)
MCV: 92.4 fL (ref 79.3–98.0)
MONO#: 1.2 10*3/uL — AB (ref 0.1–0.9)
MONO%: 12.7 % (ref 0.0–14.0)
NEUT%: 56.8 % (ref 39.0–75.0)
NEUTROS ABS: 5.2 10*3/uL (ref 1.5–6.5)
PLATELETS: 155 10*3/uL (ref 140–400)
RBC: 5.43 10*6/uL (ref 4.20–5.82)
RDW: 15.3 % — ABNORMAL HIGH (ref 11.0–14.6)
WBC: 9.1 10*3/uL (ref 4.0–10.3)

## 2014-12-02 LAB — TECHNOLOGIST REVIEW

## 2014-12-02 NOTE — Telephone Encounter (Signed)
Appointments made and avs printed for patient °

## 2014-12-02 NOTE — Progress Notes (Signed)
No images are attached to the encounter. No scans are attached to the encounter. No scans are attached to the encounter. 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 JAK-2 mutation was negative.  PRIOR THERAPY: He has received phlebotomies intermittently.  CURRENT THERAPY: Phlebotomy every 2 weeks for hematocrit greater than 45%.  INTERVAL HISTORY: Philip Powell 64 y.o. male returns for a routine visit for followup of persistent polycythemia. He is due for a repeat phlebotomy today. He continue to breathe better with every treatment. He continues on 2LO2, and sleeps with a biPAP. He denies fevers, chills, nausea, vomiting, or changes in bowel or bladder habits. He has no chest pain, cough, or palpitations. He continues to smoke 1/2 ppd, but knows he should quit.   MEDICAL HISTORY: Past Medical History  Diagnosis Date  . Essential hypertension   . COPD (chronic obstructive pulmonary disease)   . Polycythemia   . Obstructive sleep apnea   . Obesity   . History of pneumonia   . Cor pulmonale, chronic     ALLERGIES:  has No Known Allergies.  MEDICATIONS:  Current Outpatient Prescriptions  Medication Sig Dispense Refill  . albuterol (PROVENTIL HFA;VENTOLIN HFA) 108 (90 BASE) MCG/ACT inhaler Inhale 2 puffs into the lungs every 6 (six) hours as needed for wheezing or shortness of breath. 1 Inhaler 2  . aspirin 325 MG tablet Take 325 mg by mouth daily.    . carvedilol (COREG) 3.125 MG tablet Take 1 tablet (3.125 mg total) by mouth 2 (two) times daily with a meal. 60 tablet 0  . furosemide (LASIX) 40 MG tablet Take 1 tablet (40 mg total) by mouth daily. (Patient taking differently: Take 40 mg by mouth daily. Pt is taking  20 mg daily.) 30 tablet 0  . HYDROcodone-acetaminophen (NORCO) 7.5-325 MG per tablet Take 1 tablet by mouth every 6 (six) hours.    . Melatonin 3 MG TABS Take 6 mg by mouth at bedtime.    . Multiple Vitamins-Minerals (CENTRUM ADULTS) TABS Take 1 tablet by mouth daily.    . ranitidine (ZANTAC) 150 MG tablet Take 150 mg by mouth 2 (two) times daily.    Marland Kitchen testosterone cypionate (DEPOTESTOTERONE CYPIONATE) 100 MG/ML injection Inject into the muscle every 14 (fourteen) days. For IM use only     No current facility-administered medications for this visit.    SURGICAL HISTORY:  Past Surgical History  Procedure Laterality Date  . Carpal tunnel release  2008    REVIEW OF SYSTEMS:  Review of Systems  Constitutional: Negative.   HENT: Negative.   Eyes: Negative.   Respiratory: Positive for shortness of breath.        On 2L O2, wears BiPAP at night.  Cardiovascular: Negative.   Gastrointestinal: Negative.   Genitourinary: Negative.   Musculoskeletal: Negative.   Skin: Negative.   Neurological: Negative.   Endo/Heme/Allergies: Negative.   Psychiatric/Behavioral: Negative.      PHYSICAL EXAMINATION: Physical Exam  Constitutional: He is oriented to person, place, and time and well-developed, well-nourished, and in no distress.  HENT:  Head: Normocephalic.  Mouth/Throat: Oropharynx is clear and moist.  Eyes: Conjunctivae and EOM are normal. Pupils are equal, round, and reactive to light.  Neck: Neck supple. No tracheal deviation  present. No thyromegaly present.  Cardiovascular: Normal rate, regular rhythm, normal heart sounds and intact distal pulses.   No murmur heard. Pulmonary/Chest: Effort normal and breath sounds normal. No respiratory distress. He has no wheezes.  Abdominal: Soft. Bowel sounds are normal. He exhibits no distension and no mass. There is no tenderness.  Musculoskeletal: Normal range of motion. He exhibits no edema.  Neurological: He is alert and oriented to person,  place, and time. Gait normal.  Skin: Skin is warm and dry. No rash noted.  Psychiatric: Mood, memory, affect and judgment normal.    ECOG PERFORMANCE STATUS: 1 - Symptomatic but completely ambulatory  Blood pressure 122/79, pulse 83, temperature 98.1 F (36.7 C), temperature source Oral, resp. rate 20, weight 306 lb 14.4 oz (139.209 kg), SpO2 96 %.  LABORATORY DATA: Lab Results  Component Value Date   WBC 9.1 12/02/2014   HGB 16.5 12/02/2014   HCT 50.2* 12/02/2014   MCV 92.4 12/02/2014   PLT 155 12/02/2014      Chemistry      Component Value Date/Time   NA 139 10/13/2014 1302   NA 138 07/25/2014 1126   K 4.5 10/13/2014 1302   K 4.7 07/25/2014 1126   CL 97 07/25/2014 1126   CO2 25 10/13/2014 1302   CO2 34* 07/25/2014 1126   BUN 11.3 10/13/2014 1302   BUN 10 07/25/2014 1126   CREATININE 0.7 10/13/2014 1302   CREATININE 0.8 07/25/2014 1126      Component Value Date/Time   CALCIUM 9.4 10/13/2014 1302   CALCIUM 8.9 07/25/2014 1126   ALKPHOS 60 10/13/2014 1302   ALKPHOS 60 06/10/2014 1518   AST 32 10/13/2014 1302   AST 57* 06/10/2014 1518   ALT 30 10/13/2014 1302   ALT 31 06/10/2014 1518   BILITOT 1.32* 10/13/2014 1302   BILITOT 1.2 06/10/2014 1518       RADIOGRAPHIC STUDIES:  No results found.   ASSESSMENT/PLAN:  No problem-specific assessment & plan notes found for this encounter.  This is a 64 year old gentleman with persistent polycythemia. 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 His serum erythropoietin level was normal and JAK-2 mutation was negative.  The patient continues to perform well with with bimonthly phlebotomies, with the goal hematocrit being 45%. Today he is down to 50.2.  He will proceed with phlebotomy today and in 2 weeks.  He will return in 4 weeks for a follow up visit with Dr. Julien Nordmann.  Once his  hematocrit improves to approximately 45%, we will decrease the frequency of his phlebotomies.  The patient expresses understanding of his current disease status and treatment options and is in agreement with the current plan.  The patient knows to call the clinic with any problems, questions or concerns. He can certainly see the patient much sooner if necessary.   Laurie Panda, NP 12/02/2014

## 2014-12-02 NOTE — Progress Notes (Signed)
1123 Pt tolerated phlebotomy to left AC without complaints; 550g drawn off.  Pre/Post VS stable without any dizziness.  Pt refused snacks post phlebotomy; declined AVS but pt confirmed appt for 12/16/14

## 2014-12-02 NOTE — Patient Instructions (Signed)

## 2014-12-07 ENCOUNTER — Ambulatory Visit (INDEPENDENT_AMBULATORY_CARE_PROVIDER_SITE_OTHER): Payer: Managed Care, Other (non HMO) | Admitting: Pulmonary Disease

## 2014-12-07 ENCOUNTER — Encounter: Payer: Self-pay | Admitting: Pulmonary Disease

## 2014-12-07 VITALS — BP 126/66 | HR 83 | Temp 98.2°F | Ht 72.0 in | Wt 308.4 lb

## 2014-12-07 DIAGNOSIS — G4733 Obstructive sleep apnea (adult) (pediatric): Secondary | ICD-10-CM

## 2014-12-07 NOTE — Progress Notes (Signed)
   Subjective:    Patient ID: Philip Powell, male    DOB: 03-31-1951, 64 y.o.   MRN: [id number]  HPI Patient comes in today for follow-up of his known severe obstructive sleep apnea. He is also being followed by Dr. Melvyn Novas for his pulmonary issues. He was started on bilevel at the last visit, and has seen significant improvement in his sleep, breathing, and alertness during the day. 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 He is having some increased leak, and we'll work with his home care company on a better fitting mask. He does have breakthrough apnea with an AHI of 7 events per hour. We've both understood that we were starting him on a moderate pressure to let him desensitize, and will make further adjustments going forward.   Review of Systems  Constitutional: Negative for fever and unexpected weight change.  HENT: Negative for congestion, dental problem, ear pain, nosebleeds, postnasal drip, rhinorrhea, sinus pressure, sneezing, sore throat and trouble swallowing.   Eyes: Negative for redness and itching.  Respiratory: Negative for cough, chest tightness, shortness of breath and wheezing.   Cardiovascular: Negative for palpitations and leg swelling.  Gastrointestinal: Negative for nausea and vomiting.  Genitourinary: Negative for dysuria.  Musculoskeletal: Negative for joint swelling.  Skin: Negative for rash.  Neurological: Negative for headaches.  Hematological: Does not bruise/bleed easily.  Psychiatric/Behavioral: Negative for dysphoric mood. The patient is not nervous/anxious.        Objective:   Physical Exam Morbidly obese male in no acute distress Nose without purulence or discharge noted No skin breakdown or pressure necrosis from the C Pap mask Neck without lymphadenopathy or thyromegaly Lower extremities with edema noted, no cyanosis Alert and oriented, moves all 4  extremities.       Assessment & Plan:

## 2014-12-07 NOTE — Patient Instructions (Addendum)
Continue on bilevel, and will have your pressure increased to 18/14. Work with your home care company on a better fitting mask Will check your oxygen level at rest and walking today on room air >> continue on oxygen with exertion/sleep, do not require at rest.  followup with Dr. Halford Chessman in 30mos.

## 2014-12-07 NOTE — Assessment & Plan Note (Signed)
The patient feels that he is much improved since being on bilevel, and feels that his energy and alertness during the day is better as well. He is having some mask leak, and I've asked him to work with his home care company on this. His download shows breakthrough apnea on his current pressure, and we'll therefore increase to 18/14. His sleep study showed that he needed a lot more pressure than this, but the vast majority of patients rarely tolerate more than 18-20. I have also encouraged him to work aggressively on weight loss.

## 2014-12-16 ENCOUNTER — Ambulatory Visit (HOSPITAL_BASED_OUTPATIENT_CLINIC_OR_DEPARTMENT_OTHER): Payer: Managed Care, Other (non HMO)

## 2014-12-16 ENCOUNTER — Other Ambulatory Visit (HOSPITAL_BASED_OUTPATIENT_CLINIC_OR_DEPARTMENT_OTHER): Payer: Managed Care, Other (non HMO)

## 2014-12-16 VITALS — BP 132/83 | HR 82 | Temp 98.9°F | Resp 18

## 2014-12-16 DIAGNOSIS — D751 Secondary polycythemia: Secondary | ICD-10-CM | POA: Diagnosis not present

## 2014-12-16 LAB — CBC WITH DIFFERENTIAL/PLATELET
BASO%: 0.8 % (ref 0.0–2.0)
BASOS ABS: 0.1 10*3/uL (ref 0.0–0.1)
EOS ABS: 0.3 10*3/uL (ref 0.0–0.5)
EOS%: 3.6 % (ref 0.0–7.0)
HCT: 47.9 % (ref 38.4–49.9)
HGB: 15.5 g/dL (ref 13.0–17.1)
LYMPH%: 26.4 % (ref 14.0–49.0)
MCH: 29.9 pg (ref 27.2–33.4)
MCHC: 32.4 g/dL (ref 32.0–36.0)
MCV: 92.5 fL (ref 79.3–98.0)
MONO#: 1.4 10*3/uL — ABNORMAL HIGH (ref 0.1–0.9)
MONO%: 16.9 % — AB (ref 0.0–14.0)
NEUT%: 52.3 % (ref 39.0–75.0)
NEUTROS ABS: 4.4 10*3/uL (ref 1.5–6.5)
PLATELETS: 166 10*3/uL (ref 140–400)
RBC: 5.18 10*6/uL (ref 4.20–5.82)
RDW: 14.7 % — AB (ref 11.0–14.6)
WBC: 8.4 10*3/uL (ref 4.0–10.3)
lymph#: 2.2 10*3/uL (ref 0.9–3.3)

## 2014-12-16 NOTE — Progress Notes (Signed)
550 grams blood removed via therapeutic phlebotomy. Patient tolerated well. Observed post procedure with no complications. Vitals stable at discharge.

## 2014-12-16 NOTE — Patient Instructions (Signed)

## 2014-12-29 ENCOUNTER — Other Ambulatory Visit: Payer: Managed Care, Other (non HMO)

## 2014-12-29 ENCOUNTER — Telehealth: Payer: Self-pay | Admitting: Internal Medicine

## 2014-12-29 ENCOUNTER — Ambulatory Visit: Payer: Managed Care, Other (non HMO) | Admitting: Internal Medicine

## 2014-12-29 NOTE — Telephone Encounter (Signed)
returned call and lvm for pt to confirm todays appts....advised pt to call back if he needed to r/s

## 2015-01-12 ENCOUNTER — Telehealth: Payer: Self-pay | Admitting: Internal Medicine

## 2015-01-12 NOTE — Telephone Encounter (Signed)
pt called to cx appt...he will call back to r/s

## 2015-01-13 ENCOUNTER — Other Ambulatory Visit: Payer: Managed Care, Other (non HMO)

## 2015-08-06 IMAGING — CR DG CHEST 2V
2 series · 2 of 2 positions shown · non-contrast
Comparison: PA and lateral chest of June 10, 2014

CLINICAL DATA: COPD and shortness of breath with known cor
pulmonale; the patient is to undergo V/Q scanning later today.

EXAM:
CHEST  2 VIEW

[w chest pa]
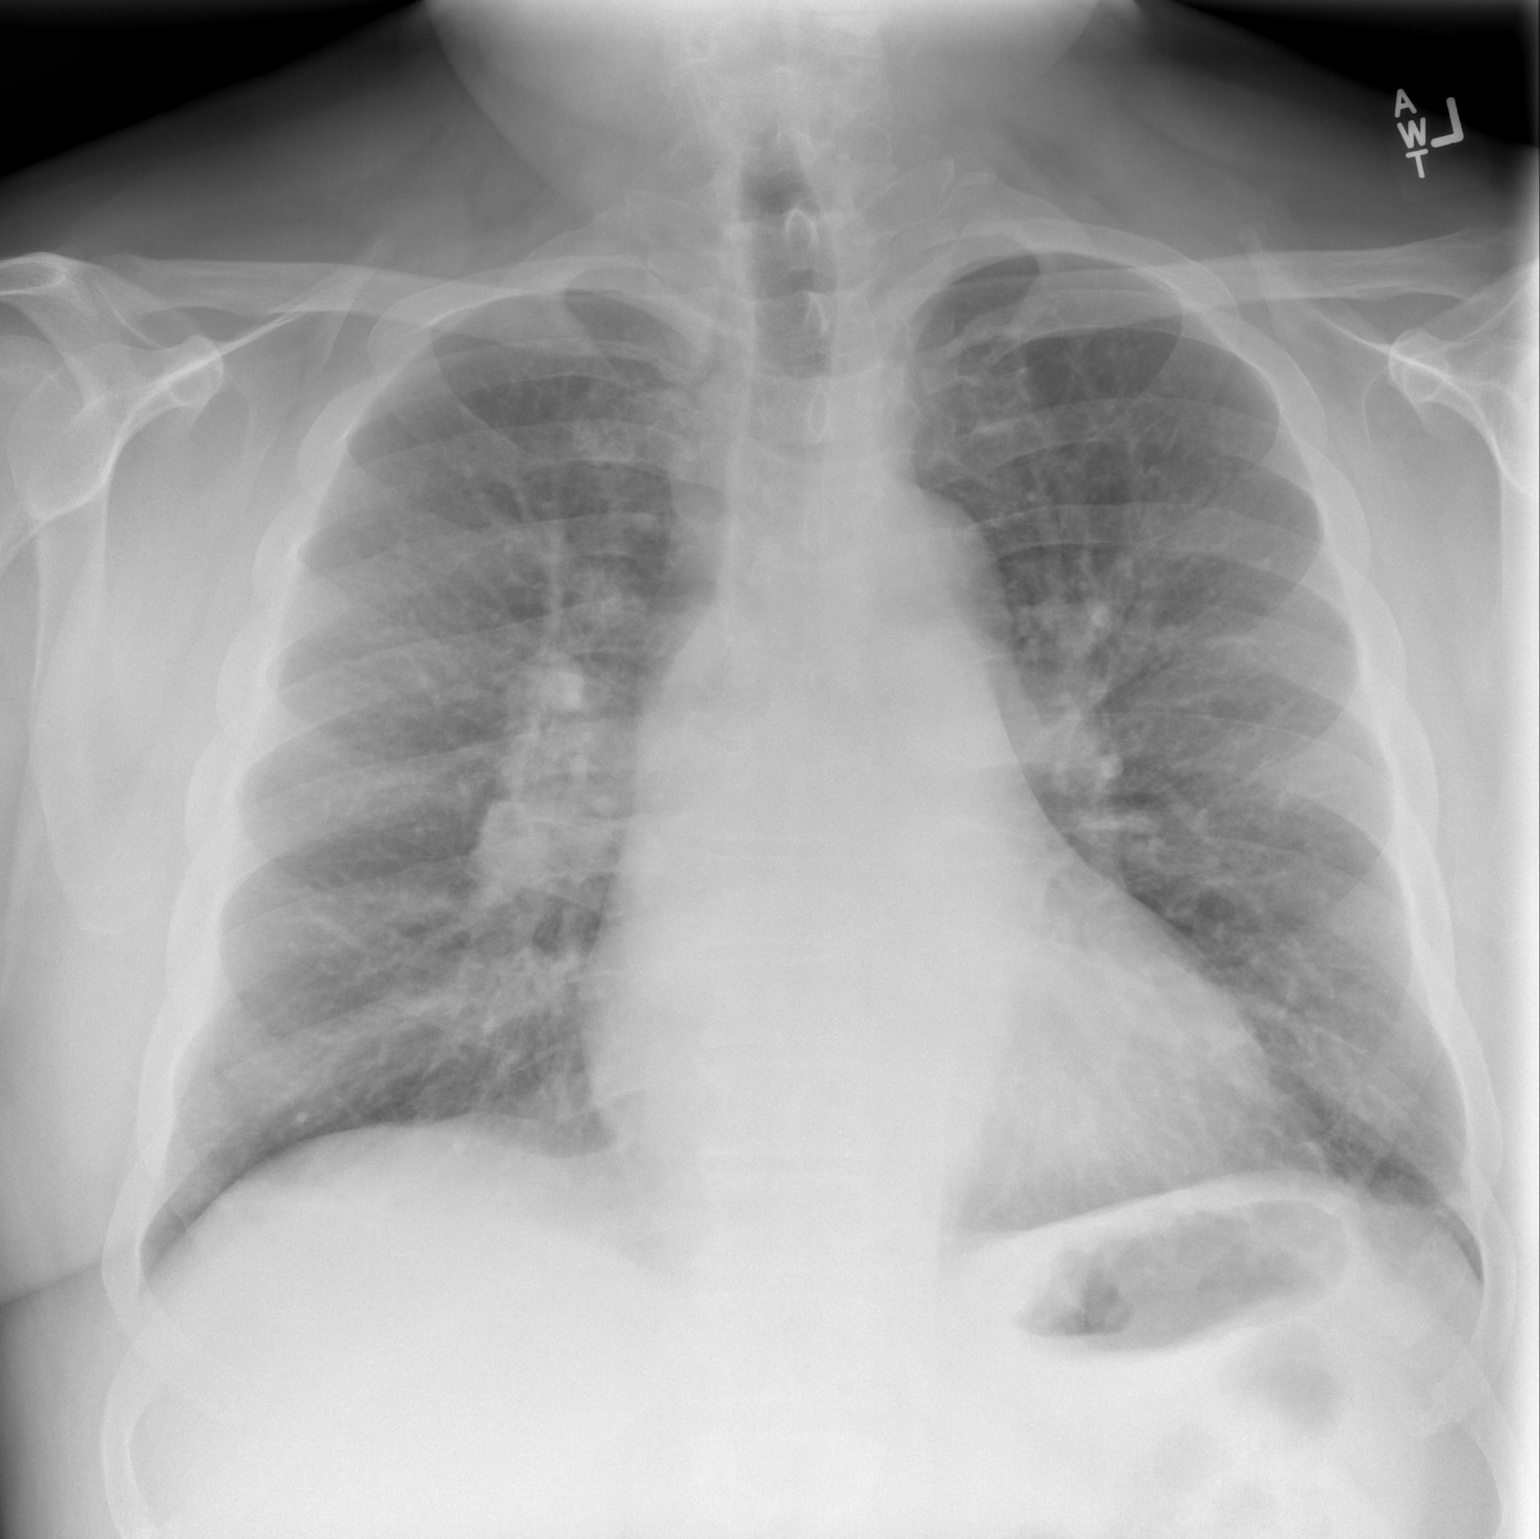

[w chest lat]
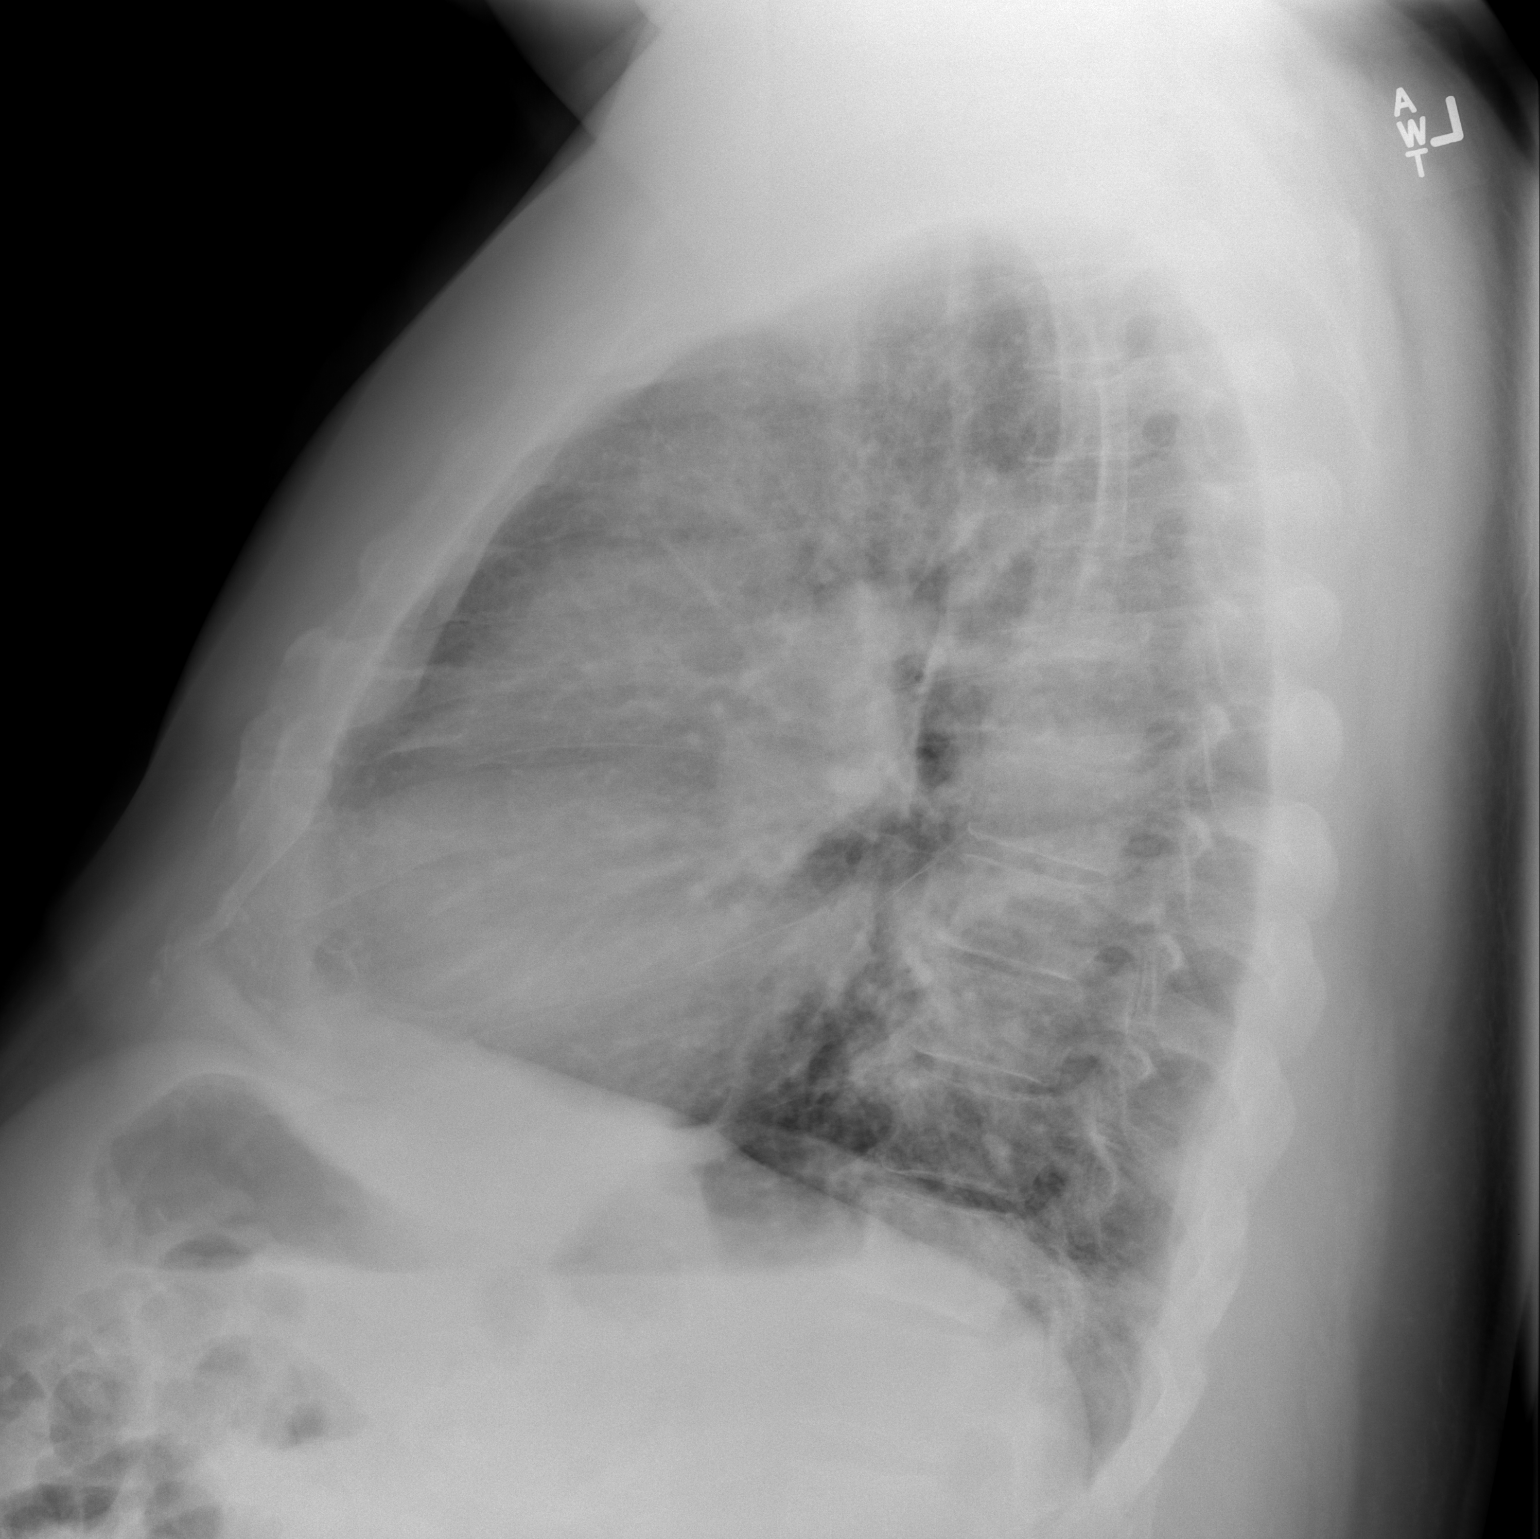

[2 of 2 positions shown; findings below may reference images not displayed]

FINDINGS: The lungs are well-expanded. There is no focal infiltrate. The
interstitial markings are coarse but less prominent than on the
previous study. The central pulmonary vascularity remains prominent
while the peripheral vascularity is unremarkable. The cardiac
silhouette is normal in size. There is mild tortuosity of the
descending thoracic aorta.
IMPRESSION: Stable changes of COPD. There is stable enlargement of central
pulmonary vessels which may reflect underlying pulmonary
hypertension.

## 2015-12-13 DIAGNOSIS — M180 Bilateral primary osteoarthritis of first carpometacarpal joints: Secondary | ICD-10-CM | POA: Insufficient documentation

## 2015-12-13 DIAGNOSIS — M47812 Spondylosis without myelopathy or radiculopathy, cervical region: Secondary | ICD-10-CM | POA: Insufficient documentation

## 2016-02-03 ENCOUNTER — Other Ambulatory Visit: Payer: Self-pay | Admitting: Orthopaedic Surgery

## 2016-02-03 DIAGNOSIS — M47812 Spondylosis without myelopathy or radiculopathy, cervical region: Secondary | ICD-10-CM

## 2016-02-10 ENCOUNTER — Ambulatory Visit
Admission: RE | Admit: 2016-02-10 | Discharge: 2016-02-10 | Disposition: A | Discharge status: Home / Self Care | Payer: No Typology Code available for payment source | Source: Ambulatory Visit | Attending: Orthopaedic Surgery | Admitting: Orthopaedic Surgery

## 2016-02-10 DIAGNOSIS — M47812 Spondylosis without myelopathy or radiculopathy, cervical region: Secondary | ICD-10-CM

## 2016-07-18 ENCOUNTER — Encounter: Payer: Self-pay | Admitting: Vascular Surgery

## 2016-07-31 ENCOUNTER — Encounter: Payer: No Typology Code available for payment source | Admitting: Vascular Surgery

## 2016-08-07 ENCOUNTER — Ambulatory Visit (INDEPENDENT_AMBULATORY_CARE_PROVIDER_SITE_OTHER): Payer: No Typology Code available for payment source | Admitting: Vascular Surgery

## 2016-08-07 ENCOUNTER — Encounter: Payer: Self-pay | Admitting: Vascular Surgery

## 2016-08-07 VITALS — BP 110/74 | HR 81 | Temp 98.9°F | Resp 20 | Ht 72.0 in | Wt 315.0 lb

## 2016-08-07 DIAGNOSIS — I714 Abdominal aortic aneurysm, without rupture, unspecified: Secondary | ICD-10-CM

## 2016-08-07 NOTE — Progress Notes (Signed)
Patient name: Philip Powell MRN: [id number] DOB: 02-18-1951 Sex: male  REASON FOR CONSULT: Abdominal aortic aneurysm. Referred by Dr. Octavio Graves.  HPI: Philip Powell is a 66 y.o. male, who was having some vague right-sided abdominal pain. This prompted a CT scan and an incidental finding was a 3.8 cm infrarenal abdominal aortic aneurysm. The patient does occasionally have some mild right-sided abdominal pain usually when he is lying on his right side. This sounds musculoskeletal. He denies any acute onset abdominal pain or back pain.  He has no family history of aneurysmal disease.  I have reviewed the records that were sent from the referring office. The patient was having some right upper quadrant abdominal pain which prompted a CT scan. This showed an incidental finding of a 3.8 cm infrarenal abdominal aortic aneurysm.  Past Medical History:  Diagnosis Date  . COPD (chronic obstructive pulmonary disease) ([place])   . Cor pulmonale, chronic ([place])   . Essential hypertension   . History of pneumonia   . Obesity   . Obstructive sleep apnea   . Polycythemia     Family History  Problem Relation Age of Onset  . Cancer Brother   . Heart disease Maternal Grandfather   . Cancer Mother   . Cancer Father     SOCIAL HISTORY: Social History   Social History  . Marital status: Married    Spouse name: N/A  . Number of children: 3  . Years of education: N/A   Occupational History  . lpn    Social History Main Topics  . Smoking status: Current Some Day Smoker    Packs/day: 1.00    Years: 43.00    Types: Cigarettes  . Smokeless tobacco: Never Used     Comment: 0.5  . Alcohol use 0.0 oz/week     Comment: occasionally  . Drug use: No  . Sexual activity: Not Currently   Other Topics Concern  . Not on file   Social History Narrative  . No narrative on file    No Known Allergies  Current Outpatient Prescriptions  Medication Sig Dispense Refill  . aspirin  325 MG tablet Take 325 mg by mouth daily.    . carvedilol (COREG) 3.125 MG tablet Take 1 tablet (3.125 mg total) by mouth 2 (two) times daily with a meal. (Patient taking differently: Take 3.125 mg by mouth daily. ) 60 tablet 0  . furosemide (LASIX) 40 MG tablet Take 1 tablet (40 mg total) by mouth daily. (Patient taking differently: Take 40 mg by mouth daily. Pt is taking 20 mg daily.) 30 tablet 0  . HYDROcodone-acetaminophen (NORCO) 7.5-325 MG per tablet Take 1 tablet by mouth every 6 (six) hours.    . Melatonin 3 MG TABS Take 6 mg by mouth at bedtime.    . Multiple Vitamins-Minerals (CENTRUM ADULTS) TABS Take 1 tablet by mouth daily.    . ranitidine (ZANTAC) 150 MG tablet Take 150 mg by mouth 2 (two) times daily.    Marland Kitchen testosterone cypionate (DEPOTESTOTERONE CYPIONATE) 100 MG/ML injection Inject into the muscle every 14 (fourteen) days. For IM use only     No current facility-administered medications for this visit.     REVIEW OF SYSTEMS:  [X]  denotes positive finding, [ ]  denotes negative finding Cardiac  Comments:  Chest pain or chest pressure:    Shortness of breath upon exertion:    Short of breath when lying flat:    Irregular heart rhythm:  Vascular    Pain in calf, thigh, or hip brought on by ambulation:    Pain in feet at night that wakes you up from your sleep:     Blood clot in your veins:    Leg swelling:         Pulmonary    Oxygen at home:    Productive cough:     Wheezing:         Neurologic    Sudden weakness in arms or legs:     Sudden numbness in arms or legs:     Sudden onset of difficulty speaking or slurred speech:    Temporary loss of vision in one eye:     Problems with dizziness:         Gastrointestinal    Blood in stool:     Vomited blood:         Genitourinary    Burning when urinating:     Blood in urine:        Psychiatric    Major depression:         Hematologic    Bleeding problems:    Problems with blood clotting too easily:          Skin    Rashes or ulcers:        Constitutional    Fever or chills:      PHYSICAL EXAM: Vitals:   08/07/16 1330  BP: 110/74  Pulse: 81  Resp: 20  Temp: 98.9 F (37.2 C)  SpO2: 90%  Weight: (!) 315 lb (142.9 kg)  Height: 6' (1.829 m)    GENERAL: The patient is a well-nourished male, in no acute distress. The vital signs are documented above. CARDIAC: There is a regular rate and rhythm.  VASCULAR: I do not detect carotid bruits. He has palpable femoral, popliteal, and pedal pulses bilaterally. PULMONARY: There is good air exchange bilaterally without wheezing or rales. ABDOMEN: Soft and non-tender with normal pitched bowel sounds. I cannot palpate his aneurysm because of his size. MUSCULOSKELETAL: There are no major deformities or cyanosis. NEUROLOGIC: No focal weakness or paresthesias are detected. SKIN: There are no ulcers or rashes noted. PSYCHIATRIC: The patient has a normal affect.  DATA:   CT ABDOMEN PELVIS: I have reviewed the CT of the abdomen and pelvis was done on 11-17. This was done for right upper quadrant abdominal pain. This showed no acute abnormalities to explain his symptoms. He had a mild compression fraction at L3 which was presumably chronic. An incidental finding was a 3.8 cm infrarenal abdominal aortic aneurysm.  MEDICAL ISSUES:  3.8 CM INFRARENAL ABDOMINAL AORTIC ANEURYSM: This patient has a small abdominal aortic aneurysm. I have explained that we would typically not consider elective repair of an aneurysm and a normal risk patient and less it reached 5.5 cm in maximum diameter. I ordered a follow up ultrasound in 18 months and I will see him back at that time. In addition, we have discussed the importance of tobacco cessation and good blood pressure control. He understands that continued tobacco use increase his risk of aneurysm expansion and rupture. In addition, we have discussed the need to get to the emergency department urgently if he develops  acute onset abdominal back pain or back pain and tell them that he has a history of an aneurysm. I'll see him back in 18 months. Most call sooner if he has problems.   Deitra Mayo [facility] [phone]

## 2016-08-08 NOTE — Addendum Note (Signed)
Addended by: Lianne Cure A on: 08/08/2016 12:06 PM   Modules accepted: Orders

## 2016-08-19 ENCOUNTER — Telehealth: Payer: Self-pay | Admitting: Medical Oncology

## 2016-08-19 NOTE — Telephone Encounter (Signed)
asking for labs -last visit in may 2016 and last phlebotomy here was 5/20 /16. His pcp is in transition and he requests CBC/diff here. Marland Kitchen He is having phlebotomy reg -every 6 weeks - at [facility] . Last was dec 2017 ( first week) and "hgb was 19". He can't get another phlebotomy by [facility] until next week.Note to [place].

## 2016-08-19 NOTE — Telephone Encounter (Signed)
told pt I will back in touch with him after speaking to [facility].

## 2016-08-19 NOTE — Telephone Encounter (Signed)
[facility] and [facility] will [facility] lab. We will be happy to see him and check his lab with the visit.

## 2016-08-20 ENCOUNTER — Telehealth: Payer: Self-pay | Admitting: *Deleted

## 2016-08-20 NOTE — Telephone Encounter (Signed)
Notified pt to continue going to [facility] for Phlebotomy and we will be glad to check labs at next office visit.

## 2016-11-18 ENCOUNTER — Encounter (HOSPITAL_COMMUNITY): Payer: Self-pay | Admitting: Emergency Medicine

## 2016-11-18 ENCOUNTER — Emergency Department (HOSPITAL_COMMUNITY)
Admission: EM | Admit: 2016-11-18 | Discharge: 2016-11-18 | Disposition: A | Discharge status: Home / Self Care | Payer: PRIVATE HEALTH INSURANCE | Attending: Emergency Medicine | Admitting: Emergency Medicine

## 2016-11-18 ENCOUNTER — Emergency Department (HOSPITAL_COMMUNITY): Payer: PRIVATE HEALTH INSURANCE

## 2016-11-18 DIAGNOSIS — F17210 Nicotine dependence, cigarettes, uncomplicated: Secondary | ICD-10-CM | POA: Diagnosis not present

## 2016-11-18 DIAGNOSIS — K4021 Bilateral inguinal hernia, without obstruction or gangrene, recurrent: Secondary | ICD-10-CM | POA: Diagnosis not present

## 2016-11-18 DIAGNOSIS — I10 Essential (primary) hypertension: Secondary | ICD-10-CM | POA: Insufficient documentation

## 2016-11-18 DIAGNOSIS — Z7982 Long term (current) use of aspirin: Secondary | ICD-10-CM | POA: Insufficient documentation

## 2016-11-18 DIAGNOSIS — J449 Chronic obstructive pulmonary disease, unspecified: Secondary | ICD-10-CM | POA: Diagnosis not present

## 2016-11-18 DIAGNOSIS — Z79899 Other long term (current) drug therapy: Secondary | ICD-10-CM | POA: Diagnosis not present

## 2016-11-18 DIAGNOSIS — R1031 Right lower quadrant pain: Secondary | ICD-10-CM

## 2016-11-18 LAB — COMPREHENSIVE METABOLIC PANEL
ALBUMIN: 4.1 g/dL (ref 3.5–5.0)
ALT: 63 U/L (ref 17–63)
AST: 61 U/L — ABNORMAL HIGH (ref 15–41)
Alkaline Phosphatase: 60 U/L (ref 38–126)
Anion gap: 7 (ref 5–15)
BUN: <5 mg/dL — ABNORMAL LOW (ref 6–20)
CHLORIDE: 102 mmol/L (ref 101–111)
CO2: 29 mmol/L (ref 22–32)
CREATININE: 0.6 mg/dL — AB (ref 0.61–1.24)
Calcium: 8.8 mg/dL — ABNORMAL LOW (ref 8.9–10.3)
GFR calc non Af Amer: >60 mL/min (ref >60)
GLUCOSE: 120 mg/dL — AB (ref 65–99)
Potassium: 4.3 mmol/L (ref 3.5–5.1)
SODIUM: 138 mmol/L (ref 135–145)
Total Bilirubin: 1 mg/dL (ref 0.3–1.2)
Total Protein: 6.7 g/dL (ref 6.5–8.1)

## 2016-11-18 LAB — URINALYSIS, ROUTINE W REFLEX MICROSCOPIC
Bilirubin Urine: NEGATIVE
Glucose, UA: NEGATIVE mg/dL
Hgb urine dipstick: NEGATIVE
KETONES UR: NEGATIVE mg/dL
Nitrite: NEGATIVE
PROTEIN: NEGATIVE mg/dL
Specific Gravity, Urine: 1.008 (ref 1.005–1.030)
pH: 5 (ref 5.0–8.0)

## 2016-11-18 LAB — CBC
HCT: 55.6 % — ABNORMAL HIGH (ref 39.0–52.0)
HEMOGLOBIN: 18.4 g/dL — AB (ref 13.0–17.0)
MCH: 29.6 pg (ref 26.0–34.0)
MCHC: 33.1 g/dL (ref 30.0–36.0)
MCV: 89.5 fL (ref 78.0–100.0)
Platelets: 143 10*3/uL — ABNORMAL LOW (ref 150–400)
RBC: 6.21 MIL/uL — AB (ref 4.22–5.81)
RDW: 18 % — ABNORMAL HIGH (ref 11.5–15.5)
WBC: 8.9 10*3/uL (ref 4.0–10.5)

## 2016-11-18 LAB — LIPASE, BLOOD: LIPASE: 36 U/L (ref 11–51)

## 2016-11-18 MED ORDER — IOPAMIDOL (ISOVUE-300) INJECTION 61%
INTRAVENOUS | Status: AC
  Administered 2016-11-18: 100 mL
  Filled 2016-11-18: qty 100

## 2016-11-18 MED ORDER — IOPAMIDOL (ISOVUE-300) INJECTION 61%
INTRAVENOUS | Status: AC
  Filled 2016-11-18: qty 30

## 2016-11-18 MED ORDER — OXYCODONE HCL 5 MG PO TABS: 5.0000 mg | ORAL_TABLET | ORAL | 0 refills | Status: DC | PRN

## 2016-11-18 NOTE — ED Triage Notes (Signed)
Pt reports RLQ pain since Sunday that is sharp in nature, denies n/v/d, states hx of the same approx 1 year ago, had ct scan with no definitive diagnosis.

## 2016-11-18 NOTE — ED Notes (Signed)
Patient transported to CT 

## 2016-11-18 NOTE — ED Provider Notes (Signed)
[facility] DEPT Provider Note   CSN: [id number] Arrival date & time: 11/18/16  1309     History   Chief Complaint Chief Complaint  Patient presents with  . Abdominal Pain    HPI Kino Dunsworth Hochstetler is a 66 y.o. male.  HPI She reports has had right lateral abdominal pain for several months. He reports approximately 8 months ago he was having a similar problem and had a CT scan done at that time. At that time there was no abnormal finding. Pain however is gotten significantly worse particularly over the past week. Reports there is a severe pain in his right lateral abdomen which radiates both up towards the scapula and the lower back. It can be triggered by position changes. No vomiting or diarrhea. Patient denies constipation. He reports 2 normal bowel movements was in the past 2 days. No pain burning or urgency with urination. No change with eating. Past Medical History:  Diagnosis Date  . COPD (chronic obstructive pulmonary disease) ([place])   . Cor pulmonale, chronic ([place])   . Essential hypertension   . History of pneumonia   . Obesity   . Obstructive sleep apnea   . Polycythemia     Patient Active Problem List   Diagnosis Date Noted  . Cor pulmonale, chronic ([place]) 06/25/2014  . Chronic respiratory failure with hypercapnia ([place]) 06/25/2014  . Acute on chronic respiratory failure ([place]) 06/16/2014  . OSA (obstructive sleep apnea)   . Obesity   . Polycythemia   . Essential hypertension   . SOB (shortness of breath)     Past Surgical History:  Procedure Laterality Date  . CARPAL TUNNEL RELEASE  2008       Home Medications    Prior to Admission medications   Medication Sig Start Date End Date Taking? Authorizing Provider  aspirin 325 MG tablet Take 325 mg by mouth daily.    Historical Provider, MD  carvedilol (COREG) 3.125 MG tablet Take 1 tablet (3.125 mg total) by mouth 2 (two) times daily with a meal. Patient taking differently: Take 3.125 mg by mouth daily.   06/16/14   Debbe Odea, MD  furosemide (LASIX) 40 MG tablet Take 1 tablet (40 mg total) by mouth daily. Patient taking differently: Take 40 mg by mouth daily. Pt is taking 20 mg daily. 06/16/14   Debbe Odea, MD  HYDROcodone-acetaminophen (NORCO) 7.5-325 MG per tablet Take 1 tablet by mouth every 6 (six) hours. 06/03/14   Historical Provider, MD  Melatonin 3 MG TABS Take 6 mg by mouth at bedtime.    Historical Provider, MD  Multiple Vitamins-Minerals (CENTRUM ADULTS) TABS Take 1 tablet by mouth daily.    Historical Provider, MD  oxyCODONE (ROXICODONE) 5 MG immediate release tablet Take 1 tablet (5 mg total) by mouth every 4 (four) hours as needed for breakthrough pain. 11/18/16   Charlesetta Shanks, MD  ranitidine (ZANTAC) 150 MG tablet Take 150 mg by mouth 2 (two) times daily.    Historical Provider, MD  testosterone cypionate (DEPOTESTOTERONE CYPIONATE) 100 MG/ML injection Inject into the muscle every 14 (fourteen) days. For IM use only    Historical Provider, MD    Family History Family History  Problem Relation Age of Onset  . Cancer Brother   . Heart disease Maternal Grandfather   . Cancer Mother   . Cancer Father     Social History Social History  Substance Use Topics  . Smoking status: Current Some Day Smoker    Packs/day: 1.00  Years: 43.00    Types: Cigarettes  . Smokeless tobacco: Never Used     Comment: 0.5  . Alcohol use 0.0 oz/week     Comment: occasionally     Allergies   Patient has no known allergies.   Review of Systems Review of Systems 10 Systems reviewed and are negative for acute change except as noted in the HPI.   Physical Exam Updated Vital Signs BP 125/85   Pulse 87   Temp 98.5 F (36.9 C) (Oral)   Resp 17   Ht 6' (1.829 m)   Wt 300 lb (136.1 kg)   SpO2 95%   BMI 40.69 kg/m   Physical Exam  Constitutional: He is oriented to person, place, and time.  Patient is alert and nontoxic. No respiratory distress. Morbid obesity.  HENT:  Head:  Normocephalic and atraumatic.  Eyes: Conjunctivae are normal.  Cardiovascular: Normal rate, regular rhythm and normal heart sounds.   Pulmonary/Chest: Effort normal.  Right breast sounds clear. Left occasional central wheeze. Good air flow to the bases.  Abdominal: Soft. Bowel sounds are normal.  Abdomen is morbidly obese but soft. Significantly reproducible pain in the right lateral mid abdomen.  Genitourinary:  Genitourinary Comments: No mass in the inguinal canals. Testicles nontender.  Musculoskeletal: Normal range of motion. He exhibits edema.  Skin changes consistent with chronic venous stasis. No open wounds or active cellulitis.  Neurological: He is alert and oriented to person, place, and time. No cranial nerve deficit. He exhibits normal muscle tone. Coordination normal.  Skin: Skin is warm and dry.  Psychiatric: He has a normal mood and affect.     ED Treatments / Results  Labs (all labs ordered are listed, but only abnormal results are displayed) Labs Reviewed  COMPREHENSIVE METABOLIC PANEL - Abnormal; Notable for the following:       Result Value   Glucose, Bld 120 (*)    BUN <5 (*)    Creatinine, Ser 0.60 (*)    Calcium 8.8 (*)    AST 61 (*)    All other components within normal limits  CBC - Abnormal; Notable for the following:    RBC 6.21 (*)    Hemoglobin 18.4 (*)    HCT 55.6 (*)    RDW 18.0 (*)    Platelets 143 (*)    All other components within normal limits  URINALYSIS, ROUTINE W REFLEX MICROSCOPIC - Abnormal; Notable for the following:    Leukocytes, UA LARGE (*)    Bacteria, UA RARE (*)    Squamous Epithelial / LPF 0-5 (*)    All other components within normal limits  LIPASE, BLOOD    EKG  EKG Interpretation None       Radiology Ct Abdomen Pelvis W Contrast  Result Date: 11/18/2016 CLINICAL DATA:  RIGHT lateral abdominal pain. History of polycythemia. EXAM: CT ABDOMEN AND PELVIS WITH CONTRAST TECHNIQUE: Multidetector CT imaging of the  abdomen and pelvis was performed using the standard protocol following bolus administration of intravenous contrast. CONTRAST:  16mL ISOVUE-300 IOPAMIDOL (ISOVUE-300) INJECTION 61% COMPARISON:  CT abdomen and pelvis June 06, 2016 FINDINGS: LOWER CHEST: Lung bases are clear. Included heart size is normal. No pericardial effusion. HEPATOBILIARY: Liver and gallbladder are normal. PANCREAS: Normal. SPLEEN: Normal. ADRENALS/URINARY TRACT: Kidneys are orthotopic, demonstrating symmetric enhancement. No nephrolithiasis, hydronephrosis or solid renal masses. Too small to characterize hypodensities LEFT kidney. The unopacified ureters are normal in course and caliber. Delayed imaging through the kidneys demonstrates symmetric prompt contrast excretion  within the proximal urinary collecting system. Urinary bladder is partially distended and unremarkable. Normal adrenal glands. STOMACH/BOWEL: The stomach, small and large bowel are normal in course and caliber without inflammatory changes. Normal appendix. VASCULAR/LYMPHATIC: 3.9 cm infrarenal aortic aneurysm with mild calcific atherosclerosis. 17 mm porta hepatis lymph node is unchanged. REPRODUCTIVE: Normal. OTHER: No intraperitoneal free fluid or free air. MUSCULOSKELETAL: Nonacute. Large RIGHT and moderate LEFT fat containing inguinal hernias. Small fat containing umbilical hernia. Bridging RIGHT sacroiliac osteophyte. Old mild L3 compression fracture. Severe L4-5 and L5-S1 degenerative discs. Gluteal varicosities. IMPRESSION: Stable examination:  No acute intra-abdominal or pelvic process. **An incidental finding of potential clinical significance has been found. 3.9 cm infrarenal aortic aneurysm, previously 3.8 cm. Recommend followup by ultrasound in 2 years. This recommendation follows ACR consensus guidelines: White Paper of the ACR Incidental Findings Committee II on Vascular Findings. J Am Coll Radiol 2013; 10:789-794.** Electronically Signed   By: Elon Alas M.D.   On: 11/18/2016 20:05    Procedures Procedures (including critical care time)  Medications Ordered in ED Medications  iopamidol (ISOVUE-300) 61 % injection (not administered)  iopamidol (ISOVUE-300) 61 % injection (100 mLs  Contrast Given 11/18/16 1933)     Initial Impression / Assessment and Plan / ED Course  I have reviewed the triage vital signs and the nursing notes.  Pertinent labs & imaging results that were available during my care of the patient were reviewed by me and considered in my medical decision making (see chart for details).      Final Clinical Impressions(s) / ED Diagnoses   Final diagnoses:  Right lower quadrant abdominal pain  Bilateral recurrent inguinal hernia without obstruction or gangrene   At this time, source of patient's pain is unclear. Pain is reproducible in the lateral mid abdomen. Patient's hernias are nontender. He is counseled on follow-up plan including follow-up with general surgery for further evaluation of present hernias as well as undefined abdominal pain. Patient has not had bowel or bladder dysfunction. He is however due for routine colonoscopy. He is to follow up with his primary provider. New Prescriptions New Prescriptions   OXYCODONE (ROXICODONE) 5 MG IMMEDIATE RELEASE TABLET    Take 1 tablet (5 mg total) by mouth every 4 (four) hours as needed for breakthrough pain.     Charlesetta Shanks, MD 11/19/16 0030

## 2016-11-18 NOTE — ED Notes (Signed)
CT scan requested on disk, CT to bring per Dian Situ.

## 2016-11-18 NOTE — ED Notes (Signed)
Pt stable, ambulatory, states understanding of discharge instructions 

## 2016-11-18 NOTE — ED Notes (Signed)
Pt able to eat per Dr. Johnney Killian

## 2017-07-31 ENCOUNTER — Encounter (HOSPITAL_COMMUNITY)
Admission: RE | Admit: 2017-07-31 | Discharge: 2017-07-31 | Disposition: A | Discharge status: Home / Self Care | Payer: Managed Care, Other (non HMO) | Source: Ambulatory Visit | Attending: *Deleted | Admitting: *Deleted

## 2017-07-31 ENCOUNTER — Encounter (HOSPITAL_COMMUNITY): Payer: Self-pay

## 2017-07-31 DIAGNOSIS — D473 Essential (hemorrhagic) thrombocythemia: Secondary | ICD-10-CM | POA: Diagnosis present

## 2017-07-31 NOTE — Progress Notes (Signed)
Philip Powell presents today for phlebotomy per MD orders. HGB/HCT: 18.4/54.1 Phlebotomy procedure started at 1345 and ended at 1351. 1 unit  Removed, per order Patient tolerated procedure well. IV needle removed intact and pressure held and pressure dressing applied. Patient drank 1 can of soda and denies any difficulties.  VS stable.

## 2017-12-02 ENCOUNTER — Encounter (INDEPENDENT_AMBULATORY_CARE_PROVIDER_SITE_OTHER): Payer: Self-pay | Admitting: *Deleted

## 2017-12-02 ENCOUNTER — Encounter (INDEPENDENT_AMBULATORY_CARE_PROVIDER_SITE_OTHER): Payer: Self-pay | Admitting: Internal Medicine

## 2017-12-02 ENCOUNTER — Ambulatory Visit (INDEPENDENT_AMBULATORY_CARE_PROVIDER_SITE_OTHER): Payer: Managed Care, Other (non HMO) | Admitting: Internal Medicine

## 2017-12-02 ENCOUNTER — Telehealth (INDEPENDENT_AMBULATORY_CARE_PROVIDER_SITE_OTHER): Payer: Self-pay | Admitting: *Deleted

## 2017-12-02 VITALS — BP 166/90 | HR 88 | Temp 98.4°F | Ht 72.0 in | Wt 303.0 lb

## 2017-12-02 DIAGNOSIS — R1030 Lower abdominal pain, unspecified: Secondary | ICD-10-CM | POA: Insufficient documentation

## 2017-12-02 MED ORDER — PEG 3350-KCL-NA BICARB-NACL 420 G PO SOLR: 4000.0000 mL | Freq: Once | ORAL | 0 refills | Status: AC

## 2017-12-02 NOTE — Telephone Encounter (Signed)
Patient needs trilyte 

## 2017-12-02 NOTE — Progress Notes (Addendum)
   Subjective:    Patient ID: Philip Powell, male    DOB: Dec 02, 1950, 67 y.o.   MRN: [id number]  HPI Referred by Dr. Melina Copa for abdominal pain.Has been having lower abdominal fpr a couple of years. Has been evaluated at [facility]. CT  Scan was normal.  A week and half ago he decided he wanted to evaluated by GI.  He says he has a hernia on the left and right. His last colonoscopy was about 10 yrs in [place] and he reports it was normal. BMs are normal. No melena or BRRB. Patient would like to proceed with a colonoscopy.  His appetite is good. No weight loss.   His last colonoscopy was in 2004 by Dr. Marina Gravel (rectal bleeding) Evidence of several diverticula in sigmoid colon.  Cecal landmarks were identified.  11/22/2017 H and H 18.7 and 55.6, total bili 0.8, ALP 64, AST 47, ALT 46 11/18/2016 CT abdomen pelvis with CM: Rt later abdominal pain. Hx of polycythemia. Stable exam. No acute intra abdominal or pelvic process.  Hx of polycythemia and has regular phlebotomy. Last phlebotomy was a week ago.    Review of Systems Past Medical History:  Diagnosis Date  . COPD (chronic obstructive pulmonary disease) ([place])   . Cor pulmonale, chronic ([place])   . Essential hypertension   . History of pneumonia   . Obesity   . Obstructive sleep apnea   . Polycythemia     Past Surgical History:  Procedure Laterality Date  . CARPAL TUNNEL RELEASE  2008    No Known Allergies  Current Outpatient Medications on File Prior to Visit  Medication Sig Dispense Refill  . aspirin 325 MG tablet Take 325 mg by mouth daily.    . furosemide (LASIX) 40 MG tablet Take 1 tablet (40 mg total) by mouth daily. (Patient taking differently: Take 40 mg by mouth as needed. Pt is taking 20 mg daily.) 30 tablet 0  . HYDROcodone-acetaminophen (NORCO) 7.5-325 MG per tablet Take 1 tablet by mouth 3 (three) times daily. As needed    . Multiple Vitamins-Minerals (CENTRUM ADULTS) TABS Take 1 tablet by mouth daily.    . ranitidine (ZANTAC)  150 MG tablet Take 150 mg by mouth daily.     Marland Kitchen testosterone cypionate (DEPOTESTOTERONE CYPIONATE) 100 MG/ML injection Inject 200 mg into the muscle every 14 (fourteen) days. For IM use only      No current facility-administered medications on file prior to visit.         Objective:   Physical Exam Blood pressure (!) 166/90, pulse 88, temperature 98.4 F (36.9 C), height 6' (1.829 m), weight (!) 303 lb (137.4 kg). Alert and oriented. Skin warm and dry. Oral mucosa is moist.   . Sclera anicteric, conjunctivae is pink. Thyroid not enlarged. No cervical lymphadenopathy.Bilateral wheezes.   Heart regular rate and rhythm.  Murmur heard. Abdomen is soft. Bowel sounds are positive. No hepatomegaly. No abdominal masses felt. No tenderness.  No edema to lower extremities.       Assessment & Plan:  Lower abdominal pain. Patient last colonoscopy was greater than 10 yrs ago He would like to proceed with a colonoscopy.  Will try to locate his last colonoscopy. The risks of bleeding, perforation and infection were reviewed with patient.

## 2017-12-02 NOTE — Patient Instructions (Signed)
The risks of bleeding, perforation and infection were reviewed with patient.  

## 2017-12-03 ENCOUNTER — Telehealth (INDEPENDENT_AMBULATORY_CARE_PROVIDER_SITE_OTHER): Payer: Self-pay | Admitting: Internal Medicine

## 2017-12-03 NOTE — Telephone Encounter (Signed)
Patient called about his hernia - wanting to know if you could refer him to a surgeon - please call [phone]

## 2017-12-04 NOTE — Telephone Encounter (Signed)
Message left on phone. Referral will need to come from PCP

## 2018-01-06 ENCOUNTER — Encounter: Payer: Self-pay | Admitting: General Surgery

## 2018-01-06 ENCOUNTER — Ambulatory Visit (INDEPENDENT_AMBULATORY_CARE_PROVIDER_SITE_OTHER): Payer: Managed Care, Other (non HMO) | Admitting: General Surgery

## 2018-01-06 VITALS — BP 169/102 | HR 97 | Temp 97.1°F | Resp 24 | Wt 312.0 lb

## 2018-01-06 DIAGNOSIS — K4020 Bilateral inguinal hernia, without obstruction or gangrene, not specified as recurrent: Secondary | ICD-10-CM | POA: Diagnosis not present

## 2018-01-06 NOTE — Patient Instructions (Signed)

## 2018-01-06 NOTE — Progress Notes (Signed)
Philip Powell; [id number]; June 05, 1951   HPI Patient is a 67yo wm who was referred to my care for evaluation and treatment of bilateral inguinal hernias.  Was seen on recent CT scan of the abdomen.  States he does not have groin pain.  Has back pain which radiates around to right flank.  No nausea, vomiting noted.  Not made worse with standing.  Was seen by another surgeon in [place] who said he would not operated on him until he stopped smoking.  No pain in scrotum. Past Medical History:  Diagnosis Date  . COPD (chronic obstructive pulmonary disease) ([place])   . Cor pulmonale, chronic ([place])   . Essential hypertension   . History of pneumonia   . Obesity   . Obstructive sleep apnea   . Polycythemia     Past Surgical History:  Procedure Laterality Date  . CARPAL TUNNEL RELEASE  2008    Family History  Problem Relation Age of Onset  . Cancer Brother   . Heart disease Maternal Grandfather   . Cancer Mother   . Cancer Father     Current Outpatient Medications on File Prior to Visit  Medication Sig Dispense Refill  . aspirin 325 MG tablet Take 325 mg by mouth daily.    Marland Kitchen HYDROcodone-acetaminophen (NORCO) 7.5-325 MG per tablet Take 1 tablet by mouth 3 (three) times daily. As needed    . Multiple Vitamins-Minerals (CENTRUM ADULTS) TABS Take 1 tablet by mouth daily.    . ranitidine (ZANTAC) 150 MG tablet Take 150 mg by mouth daily.     Marland Kitchen testosterone cypionate (DEPOTESTOTERONE CYPIONATE) 100 MG/ML injection Inject 200 mg into the muscle every 14 (fourteen) days. For IM use only     . furosemide (LASIX) 40 MG tablet Take 1 tablet (40 mg total) by mouth daily. (Patient not taking: Reported on 01/06/2018) 30 tablet 0   No current facility-administered medications on file prior to visit.     No Known Allergies  Social History   Substance and Sexual Activity  Alcohol Use Yes  . Alcohol/week: 0.0 oz   Comment: occasionally    Social History   Tobacco Use  Smoking Status  Current Every Day Smoker  . Packs/day: 1.00  . Years: 43.00  . Pack years: 43.00  . Types: Cigarettes  Smokeless Tobacco Never Used  Tobacco Comment   0.5    Review of Systems  Constitutional: Positive for malaise/fatigue.  Eyes: Negative.   Respiratory: Positive for shortness of breath.   Gastrointestinal: Positive for abdominal pain.  Genitourinary: Negative.   Musculoskeletal: Positive for back pain and joint pain.  Skin: Negative.   Neurological: Negative.   Endo/Heme/Allergies: Negative.   Psychiatric/Behavioral: Negative.     Objective   Vitals:   01/06/18 1447  BP: (!) 169/102  Pulse: 97  Resp: (!) 24  Temp: (!) 97.1 F (36.2 C)    Physical Exam  Constitutional: He is oriented to person, place, and time. He appears well-developed and well-nourished.  HENT:  Head: Normocephalic.  Cardiovascular: Normal rate, regular rhythm and normal heart sounds. Exam reveals no gallop and no friction rub.  No murmur heard. Pulmonary/Chest: Effort normal and breath sounds normal. No stridor. No respiratory distress. He has no wheezes. He has no rales.  Abdominal: Soft. Bowel sounds are normal. He exhibits no distension and no mass. There is no tenderness. There is no rebound and no guarding. A hernia is present.  Bilateral hernias, though not protruding at this time.  Neurological:  He is alert and oriented to person, place, and time.  Skin: Skin is warm and dry.  Vitals reviewed. CT scan images personally reviewed.  Assessment  Bilateral inguinal hernias, asymptomatic Plan   I told patient that his back and right flank abdominal pain is not related to the hernias. Would not recommend surgery unless absolutely necessary as he is at increased risk for surgical complications from his multiple comorbidities.  He understands and agrees.  Follow up prn.

## 2018-01-07 NOTE — Patient Instructions (Signed)
Philip Powell  01/07/2018     @PREFPERIOPPHARMACY @   Your procedure is scheduled on  01/16/2018 .  Report to [facility] at  615   A.M.  Call this number if you have problems the morning of surgery:  [phone]   Remember:  Do not eat or drink after midnight.  You may drink clear liquids until (follow the diet instructions from Dr Olevia Perches office) .  Clear liquids allowed are:                    Water, Juice (non-citric and without pulp), Carbonated beverages, Clear Tea, Black Coffee only, Plain Jell-O only, Gatorade and Plain Popsicles only    Take these medicines the morning of surgery with A SIP OF WATER  Hydrocodone, zantac.    Do not wear jewelry, make-up or nail polish.  Do not wear lotions, powders, or perfumes, or deodorant.  Do not shave 48 hours prior to surgery.  Men may shave face and neck.  Do not bring valuables to the hospital.  [facility] is not responsible for any belongings or valuables.  Contacts, dentures or bridgework may not be worn into surgery.  Leave your suitcase in the car.  After surgery it may be brought to your room.  For patients admitted to the hospital, discharge time will be determined by your treatment team.  Patients discharged the day of surgery will not be allowed to drive home.   Name and phone number of your driver:   family Special instructions:  Follow the diet and prep instructions given to you by Dr Olevia Perches office.  Please read over the following fact sheets that you were given. Anesthesia Post-op Instructions and Care and Recovery After Surgery       Colonoscopy, Adult A colonoscopy is an exam to look at the large intestine. It is done to check for problems, such as:  Lumps (tumors).  Growths (polyps).  Swelling (inflammation).  Bleeding.  What happens before the procedure? Eating and drinking Follow instructions from your doctor about eating and drinking. These instructions may include:  A  few days before the procedure - follow a low-fiber diet. ? Avoid nuts. ? Avoid seeds. ? Avoid dried fruit. ? Avoid raw fruits. ? Avoid vegetables.  1-3 days before the procedure - follow a clear liquid diet. Avoid liquids that have red or purple dye. Drink only clear liquids, such as: ? Clear broth or bouillon. ? Black coffee or tea. ? Clear juice. ? Clear soft drinks or sports drinks. ? Gelatin dessert. ? Popsicles.  On the day of the procedure - do not eat or drink anything during the 2 hours before the procedure.  Bowel prep If you were prescribed an oral bowel prep:  Take it as told by your doctor. Starting the day before your procedure, you will need to drink a lot of liquid. The liquid will cause you to poop (have bowel movements) until your poop is almost clear or light green.  If your skin or butt gets irritated from diarrhea, you may: ? Wipe the area with wipes that have medicine in them, such as adult wet wipes with aloe and vitamin E. ? Put something on your skin that soothes the area, such as petroleum jelly.  If you throw up (vomit) while drinking the bowel prep, take a break for up to 60 minutes. Then begin the bowel prep again. If you keep throwing  up and you cannot take the bowel prep without throwing up, call your doctor.  General instructions  Ask your doctor about changing or stopping your normal medicines. This is important if you take diabetes medicines or blood thinners.  Plan to have someone take you home from the hospital or clinic. What happens during the procedure?  An IV tube may be put into one of your veins.  You will be given medicine to help you relax (sedative).  To reduce your risk of infection: ? Your doctors will wash their hands. ? Your anal area will be washed with soap.  You will be asked to lie on your side with your knees bent.  Your doctor will get a long, thin, flexible tube ready. The tube will have a camera and a light on the  end.  The tube will be put into your anus.  The tube will be gently put into your large intestine.  Air will be delivered into your large intestine to keep it open. You may feel some pressure or cramping.  The camera will be used to take photos.  A small tissue sample may be removed from your body to be looked at under a microscope (biopsy). If any possible problems are found, the tissue will be sent to a lab for testing.  If small growths are found, your doctor may remove them and have them checked for cancer.  The tube that was put into your anus will be slowly removed. The procedure may vary among doctors and hospitals. What happens after the procedure?  Your doctor will check on you often until the medicines you were given have worn off.  Do not drive for 24 hours after the procedure.  You may have a small amount of blood in your poop.  You may pass gas.  You may have mild cramps or bloating in your belly (abdomen).  It is up to you to get the results of your procedure. Ask your doctor, or the department performing the procedure, when your results will be ready. This information is not intended to replace advice given to you by your health care provider. Make sure you discuss any questions you have with your health care provider. Document Released: 08/17/2010 Document Revised: 05/15/2016 Document Reviewed: 09/26/2015 Elsevier Interactive Patient Education  2017 [employer]  Colonoscopy, Adult, Care After This sheet gives you information about how to care for yourself after your procedure. Your health care provider may also give you more specific instructions. If you have problems or questions, contact your health care provider. What can I expect after the procedure? After the procedure, it is common to have:  A small amount of blood in your stool for 24 hours after the procedure.  Some gas.  Mild abdominal cramping or bloating.  Follow these instructions at  home: General instructions   For the first 24 hours after the procedure: ? Do not drive or use machinery. ? Do not sign important documents. ? Do not drink alcohol. ? Do your regular daily activities at a slower pace than normal. ? Eat soft, easy-to-digest foods. ? Rest often.  Take over-the-counter or prescription medicines only as told by your health care provider.  It is up to you to get the results of your procedure. Ask your health care provider, or the department performing the procedure, when your results will be ready. Relieving cramping and bloating  Try walking around when you have cramps or feel bloated.  Apply heat to your abdomen  as told by your health care provider. Use a heat source that your health care provider recommends, such as a moist heat pack or a heating pad. ? Place a towel between your skin and the heat source. ? Leave the heat on for 20-30 minutes. ? Remove the heat if your skin turns bright red. This is especially important if you are unable to feel pain, heat, or cold. You may have a greater risk of getting burned. Eating and drinking  Drink enough fluid to keep your urine clear or pale yellow.  Resume your normal diet as instructed by your health care provider. Avoid heavy or fried foods that are hard to digest.  Avoid drinking alcohol for as long as instructed by your health care provider. Contact a health care provider if:  You have blood in your stool 2-3 days after the procedure. Get help right away if:  You have more than a small spotting of blood in your stool.  You pass large blood clots in your stool.  Your abdomen is swollen.  You have nausea or vomiting.  You have a fever.  You have increasing abdominal pain that is not relieved with medicine. This information is not intended to replace advice given to you by your health care provider. Make sure you discuss any questions you have with your health care provider. Document Released:  02/27/2004 Document Revised: 04/08/2016 Document Reviewed: 09/26/2015 Elsevier Interactive Patient Education  2018 [place] Anesthesia is a term that refers to techniques, procedures, and medicines that help a person stay safe and comfortable during a medical procedure. Monitored anesthesia care, or sedation, is one type of anesthesia. Your anesthesia specialist may recommend sedation if you will be having a procedure that does not require you to be unconscious, such as:  Cataract surgery.  A dental procedure.  A biopsy.  A colonoscopy.  During the procedure, you may receive a medicine to help you relax (sedative). There are three levels of sedation:  Mild sedation. At this level, you may feel awake and relaxed. You will be able to follow directions.  Moderate sedation. At this level, you will be sleepy. You may not remember the procedure.  Deep sedation. At this level, you will be asleep. You will not remember the procedure.  The more medicine you are given, the deeper your level of sedation will be. Depending on how you respond to the procedure, the anesthesia specialist may change your level of sedation or the type of anesthesia to fit your needs. An anesthesia specialist will monitor you closely during the procedure. Let your health care provider know about:  Any allergies you have.  All medicines you are taking, including vitamins, herbs, eye drops, creams, and over-the-counter medicines.  Any use of steroids (by mouth or as a cream).  Any problems you or family members have had with sedatives and anesthetic medicines.  Any blood disorders you have.  Any surgeries you have had.  Any medical conditions you have, such as sleep apnea.  Whether you are pregnant or may be pregnant.  Any use of cigarettes, alcohol, or street drugs. What are the risks? Generally, this is a safe procedure. However, problems may occur, including:  Getting too  much medicine (oversedation).  Nausea.  Allergic reaction to medicines.  Trouble breathing. If this happens, a breathing tube may be used to help with breathing. It will be removed when you are awake and breathing on your own.  Heart trouble.  Lung  trouble.  Before the procedure Staying hydrated Follow instructions from your health care provider about hydration, which may include:  Up to 2 hours before the procedure - you may continue to drink clear liquids, such as water, clear fruit juice, black coffee, and plain tea.  Eating and drinking restrictions Follow instructions from your health care provider about eating and drinking, which may include:  8 hours before the procedure - stop eating heavy meals or foods such as meat, fried foods, or fatty foods.  6 hours before the procedure - stop eating light meals or foods, such as toast or cereal.  6 hours before the procedure - stop drinking milk or drinks that contain milk.  2 hours before the procedure - stop drinking clear liquids.  Medicines Ask your health care provider about:  Changing or stopping your regular medicines. This is especially important if you are taking diabetes medicines or blood thinners.  Taking medicines such as aspirin and ibuprofen. These medicines can thin your blood. Do not take these medicines before your procedure if your health care provider instructs you not to.  Tests and exams  You will have a physical exam.  You may have blood tests done to show: ? How well your kidneys and liver are working. ? How well your blood can clot.  General instructions  Plan to have someone take you home from the hospital or clinic.  If you will be going home right after the procedure, plan to have someone with you for 24 hours.  What happens during the procedure?  Your blood pressure, heart rate, breathing, level of pain and overall condition will be monitored.  An IV tube will be inserted into one of  your veins.  Your anesthesia specialist will give you medicines as needed to keep you comfortable during the procedure. This may mean changing the level of sedation.  The procedure will be performed. After the procedure  Your blood pressure, heart rate, breathing rate, and blood oxygen level will be monitored until the medicines you were given have worn off.  Do not drive for 24 hours if you received a sedative.  You may: ? Feel sleepy, clumsy, or nauseous. ? Feel forgetful about what happened after the procedure. ? Have a sore throat if you had a breathing tube during the procedure. ? Vomit. This information is not intended to replace advice given to you by your health care provider. Make sure you discuss any questions you have with your health care provider. Document Released: 04/10/2005 Document Revised: 12/22/2015 Document Reviewed: 11/05/2015 Elsevier Interactive Patient Education  2018 [place], Care After These instructions provide you with information about caring for yourself after your procedure. Your health care provider may also give you more specific instructions. Your treatment has been planned according to current medical practices, but problems sometimes occur. Call your health care provider if you have any problems or questions after your procedure. What can I expect after the procedure? After your procedure, it is common to:  Feel sleepy for several hours.  Feel clumsy and have poor balance for several hours.  Feel forgetful about what happened after the procedure.  Have poor judgment for several hours.  Feel nauseous or vomit.  Have a sore throat if you had a breathing tube during the procedure.  Follow these instructions at home: For at least 24 hours after the procedure:   Do not: ? Participate in activities in which you could fall or become injured. ?  Drive. ? Use heavy machinery. ? Drink alcohol. ? Take sleeping pills  or medicines that cause drowsiness. ? Make important decisions or sign legal documents. ? Take care of children on your own.  Rest. Eating and drinking  Follow the diet that is recommended by your health care provider.  If you vomit, drink water, juice, or soup when you can drink without vomiting.  Make sure you have little or no nausea before eating solid foods. General instructions  Have a responsible adult stay with you until you are awake and alert.  Take over-the-counter and prescription medicines only as told by your health care provider.  If you smoke, do not smoke without supervision.  Keep all follow-up visits as told by your health care provider. This is important. Contact a health care provider if:  You keep feeling nauseous or you keep vomiting.  You feel light-headed.  You develop a rash.  You have a fever. Get help right away if:  You have trouble breathing. This information is not intended to replace advice given to you by your health care provider. Make sure you discuss any questions you have with your health care provider. Document Released: 11/05/2015 Document Revised: 03/06/2016 Document Reviewed: 11/05/2015 Elsevier Interactive Patient Education  [employer].

## 2018-01-12 ENCOUNTER — Inpatient Hospital Stay (HOSPITAL_COMMUNITY)
Admission: RE | Admit: 2018-01-12 | Discharge: 2018-01-12 | Disposition: A | Discharge status: Home / Self Care | Payer: Managed Care, Other (non HMO) | Source: Ambulatory Visit

## 2018-01-12 ENCOUNTER — Encounter (HOSPITAL_COMMUNITY): Payer: Self-pay

## 2018-01-16 ENCOUNTER — Ambulatory Visit (HOSPITAL_COMMUNITY)
Admission: RE | Admit: 2018-01-16 | Payer: Managed Care, Other (non HMO) | Source: Ambulatory Visit | Admitting: Internal Medicine

## 2018-01-16 ENCOUNTER — Encounter (HOSPITAL_COMMUNITY): Admission: RE | Payer: Self-pay | Source: Ambulatory Visit

## 2018-01-16 SURGERY — COLONOSCOPY WITH PROPOFOL
Anesthesia: Monitor Anesthesia Care

## 2018-02-11 ENCOUNTER — Ambulatory Visit: Payer: No Typology Code available for payment source | Admitting: Vascular Surgery

## 2018-02-11 ENCOUNTER — Other Ambulatory Visit (HOSPITAL_COMMUNITY): Payer: No Typology Code available for payment source

## 2018-04-17 ENCOUNTER — Encounter (HOSPITAL_COMMUNITY): Payer: Self-pay

## 2018-04-17 ENCOUNTER — Encounter (HOSPITAL_COMMUNITY)
Admission: RE | Admit: 2018-04-17 | Discharge: 2018-04-17 | Disposition: A | Discharge status: Home / Self Care | Payer: Medicare Other | Source: Ambulatory Visit | Attending: *Deleted | Admitting: *Deleted

## 2018-04-17 DIAGNOSIS — D473 Essential (hemorrhagic) thrombocythemia: Secondary | ICD-10-CM | POA: Diagnosis not present

## 2018-04-17 LAB — POCT HEMOGLOBIN-HEMACUE: HEMOGLOBIN: 18.8 g/dL — AB (ref 13.0–17.0)

## 2018-04-17 NOTE — Progress Notes (Signed)
Philip Powell presents today for phlebotomy per MD orders. HGB 18.8 Phlebotomy procedure started at 1020 and ended at 1025 500 cc removed. Patient tolerated procedure well without s/s.  States he feels better. IV needle removed intact and pressure held.  Pressure dressing applied.

## 2018-05-20 ENCOUNTER — Encounter (HOSPITAL_COMMUNITY)
Admission: RE | Admit: 2018-05-20 | Discharge: 2018-05-20 | Disposition: A | Discharge status: Home / Self Care | Payer: Medicare Other | Source: Ambulatory Visit | Attending: *Deleted | Admitting: *Deleted

## 2018-05-20 ENCOUNTER — Encounter (HOSPITAL_COMMUNITY): Payer: Self-pay

## 2018-05-20 DIAGNOSIS — D473 Essential (hemorrhagic) thrombocythemia: Secondary | ICD-10-CM | POA: Insufficient documentation

## 2018-05-20 NOTE — Progress Notes (Signed)
Philip Powell presents today for phlebotomy per MD orders. HGB 18.8 Phlebotomy procedure started at 1310 and ended at 1315 428.81 cc removed. Patient tolerated procedure well. Hydrated post phlebotomy and had no s/s of distress following procedure. IV needle removed intact.  Pressure dressing applied.

## 2018-07-17 ENCOUNTER — Telehealth (HOSPITAL_COMMUNITY): Payer: Self-pay | Admitting: *Deleted

## 2018-07-20 NOTE — Progress Notes (Signed)
Spoke to [employer] nurse, who stated that she is not in the office this week and has no orders for phlebotomy for this patient and will have to fax next Monday.  Message left for patient, to that effect and left callback number if he had any questions.

## 2018-07-20 NOTE — Addendum Note (Signed)
Encounter addended by: Holley Dexter, RN on: 07/20/2018 8:33 AM  Actions taken: Clinical Note Signed

## 2018-07-27 ENCOUNTER — Encounter (HOSPITAL_COMMUNITY): Payer: Self-pay

## 2018-07-27 ENCOUNTER — Encounter (HOSPITAL_COMMUNITY)
Admission: RE | Admit: 2018-07-27 | Discharge: 2018-07-27 | Disposition: A | Discharge status: Home / Self Care | Payer: Medicare Other | Source: Ambulatory Visit | Attending: *Deleted | Admitting: *Deleted

## 2018-07-27 DIAGNOSIS — D473 Essential (hemorrhagic) thrombocythemia: Secondary | ICD-10-CM | POA: Insufficient documentation

## 2018-07-27 NOTE — Progress Notes (Signed)
Si Philip Powell presents today for phlebotomy per MD orders. HGB 19.8 on 12/18 Phlebotomy procedure started at 1330 and ended at 1337. 606 cc removed. Patient tolerated procedure well. Denies s/s of issues.  VS stable pre and post.  Ginger ale provided.  IV needle removed intact and adequate pressure applied.  Pressure dressing in place.

## 2018-07-31 ENCOUNTER — Encounter (HOSPITAL_COMMUNITY): Payer: Medicare Other

## 2018-09-10 ENCOUNTER — Encounter (HOSPITAL_COMMUNITY)
Admission: RE | Admit: 2018-09-10 | Discharge: 2018-09-10 | Disposition: A | Discharge status: Home / Self Care | Payer: Medicare Other | Source: Ambulatory Visit | Attending: *Deleted | Admitting: *Deleted

## 2018-09-10 DIAGNOSIS — D473 Essential (hemorrhagic) thrombocythemia: Secondary | ICD-10-CM | POA: Insufficient documentation

## 2018-09-10 LAB — CBC
HCT: 61.8 % — ABNORMAL HIGH (ref 39.0–52.0)
Hemoglobin: 18.7 g/dL — ABNORMAL HIGH (ref 13.0–17.0)
MCH: 25.6 pg — ABNORMAL LOW (ref 26.0–34.0)
MCHC: 30.3 g/dL (ref 30.0–36.0)
MCV: 84.7 fL (ref 80.0–100.0)
NRBC: 0 % (ref 0.0–0.2)
PLATELETS: 138 10*3/uL — AB (ref 150–400)
RBC: 7.3 MIL/uL — ABNORMAL HIGH (ref 4.22–5.81)
RDW: 23.7 % — AB (ref 11.5–15.5)
WBC: 7.7 10*3/uL (ref 4.0–10.5)

## 2018-09-11 NOTE — Progress Notes (Signed)
Philip Powell presents today for phlebotomy per MD orders. HGB/HCT:18.7/61.8 Phlebotomy procedure started at 1325 and ended at 61. 561 cc removed. Patient tolerated procedure well.  VS Stable pre and post procedure.  PO fluids provided.  Denies c/o upon discharge. IV needle removed intact. Pressure dressing applied once hemostasis was achieved.

## 2018-12-15 ENCOUNTER — Emergency Department (HOSPITAL_COMMUNITY): Payer: Medicare Other

## 2018-12-15 ENCOUNTER — Encounter (HOSPITAL_COMMUNITY): Payer: Self-pay | Admitting: Emergency Medicine

## 2018-12-15 ENCOUNTER — Other Ambulatory Visit: Payer: Self-pay

## 2018-12-15 ENCOUNTER — Inpatient Hospital Stay (HOSPITAL_COMMUNITY)
Admission: EM | Admit: 2018-12-15 | Discharge: 2018-12-18 | DRG: 291 | Disposition: A | Discharge status: Home / Self Care | Payer: Medicare Other | Attending: Internal Medicine | Admitting: Internal Medicine

## 2018-12-15 DIAGNOSIS — I2781 Cor pulmonale (chronic): Secondary | ICD-10-CM | POA: Diagnosis present

## 2018-12-15 DIAGNOSIS — Z8249 Family history of ischemic heart disease and other diseases of the circulatory system: Secondary | ICD-10-CM | POA: Diagnosis not present

## 2018-12-15 DIAGNOSIS — E662 Morbid (severe) obesity with alveolar hypoventilation: Secondary | ICD-10-CM | POA: Diagnosis present

## 2018-12-15 DIAGNOSIS — Z79891 Long term (current) use of opiate analgesic: Secondary | ICD-10-CM

## 2018-12-15 DIAGNOSIS — R0602 Shortness of breath: Secondary | ICD-10-CM

## 2018-12-15 DIAGNOSIS — Z716 Tobacco abuse counseling: Secondary | ICD-10-CM

## 2018-12-15 DIAGNOSIS — J440 Chronic obstructive pulmonary disease with acute lower respiratory infection: Secondary | ICD-10-CM | POA: Diagnosis present

## 2018-12-15 DIAGNOSIS — I110 Hypertensive heart disease with heart failure: Principal | ICD-10-CM | POA: Diagnosis present

## 2018-12-15 DIAGNOSIS — I5031 Acute diastolic (congestive) heart failure: Secondary | ICD-10-CM | POA: Diagnosis not present

## 2018-12-15 DIAGNOSIS — Z79899 Other long term (current) drug therapy: Secondary | ICD-10-CM

## 2018-12-15 DIAGNOSIS — Z9119 Patient's noncompliance with other medical treatment and regimen: Secondary | ICD-10-CM

## 2018-12-15 DIAGNOSIS — J9621 Acute and chronic respiratory failure with hypoxia: Secondary | ICD-10-CM | POA: Diagnosis present

## 2018-12-15 DIAGNOSIS — F17210 Nicotine dependence, cigarettes, uncomplicated: Secondary | ICD-10-CM | POA: Diagnosis present

## 2018-12-15 DIAGNOSIS — Z8701 Personal history of pneumonia (recurrent): Secondary | ICD-10-CM

## 2018-12-15 DIAGNOSIS — I2721 Secondary pulmonary arterial hypertension: Secondary | ICD-10-CM | POA: Diagnosis present

## 2018-12-15 DIAGNOSIS — Z20828 Contact with and (suspected) exposure to other viral communicable diseases: Secondary | ICD-10-CM | POA: Diagnosis present

## 2018-12-15 DIAGNOSIS — D696 Thrombocytopenia, unspecified: Secondary | ICD-10-CM | POA: Diagnosis present

## 2018-12-15 DIAGNOSIS — D751 Secondary polycythemia: Secondary | ICD-10-CM | POA: Diagnosis not present

## 2018-12-15 DIAGNOSIS — Z6841 Body Mass Index (BMI) 40.0 and over, adult: Secondary | ICD-10-CM | POA: Diagnosis not present

## 2018-12-15 DIAGNOSIS — I878 Other specified disorders of veins: Secondary | ICD-10-CM | POA: Diagnosis present

## 2018-12-15 DIAGNOSIS — J9622 Acute and chronic respiratory failure with hypercapnia: Secondary | ICD-10-CM | POA: Diagnosis present

## 2018-12-15 DIAGNOSIS — I5082 Biventricular heart failure: Secondary | ICD-10-CM | POA: Diagnosis present

## 2018-12-15 DIAGNOSIS — G8929 Other chronic pain: Secondary | ICD-10-CM | POA: Diagnosis present

## 2018-12-15 DIAGNOSIS — I10 Essential (primary) hypertension: Secondary | ICD-10-CM | POA: Diagnosis present

## 2018-12-15 DIAGNOSIS — D45 Polycythemia vera: Secondary | ICD-10-CM | POA: Diagnosis present

## 2018-12-15 DIAGNOSIS — J189 Pneumonia, unspecified organism: Secondary | ICD-10-CM | POA: Diagnosis present

## 2018-12-15 DIAGNOSIS — R0689 Other abnormalities of breathing: Secondary | ICD-10-CM | POA: Diagnosis not present

## 2018-12-15 DIAGNOSIS — G9341 Metabolic encephalopathy: Secondary | ICD-10-CM | POA: Diagnosis present

## 2018-12-15 DIAGNOSIS — Z7982 Long term (current) use of aspirin: Secondary | ICD-10-CM

## 2018-12-15 DIAGNOSIS — G4733 Obstructive sleep apnea (adult) (pediatric): Secondary | ICD-10-CM | POA: Diagnosis present

## 2018-12-15 DIAGNOSIS — I361 Nonrheumatic tricuspid (valve) insufficiency: Secondary | ICD-10-CM

## 2018-12-15 DIAGNOSIS — R0902 Hypoxemia: Secondary | ICD-10-CM

## 2018-12-15 DIAGNOSIS — M1990 Unspecified osteoarthritis, unspecified site: Secondary | ICD-10-CM | POA: Diagnosis present

## 2018-12-15 DIAGNOSIS — J441 Chronic obstructive pulmonary disease with (acute) exacerbation: Secondary | ICD-10-CM | POA: Diagnosis present

## 2018-12-15 DIAGNOSIS — I5033 Acute on chronic diastolic (congestive) heart failure: Secondary | ICD-10-CM | POA: Diagnosis present

## 2018-12-15 LAB — COMPREHENSIVE METABOLIC PANEL
ALT: UNDETERMINED U/L (ref 0–44)
ALT: 26 U/L (ref 0–44)
AST: UNDETERMINED U/L (ref 15–41)
AST: 30 U/L (ref 15–41)
Albumin: UNDETERMINED g/dL (ref 3.5–5.0)
Albumin: 3.7 g/dL (ref 3.5–5.0)
Alkaline Phosphatase: UNDETERMINED U/L (ref 38–126)
Alkaline Phosphatase: 47 U/L (ref 38–126)
Anion gap: UNDETERMINED (ref 5–15)
Anion gap: 10 (ref 5–15)
BUN: UNDETERMINED mg/dL (ref 8–23)
BUN: 11 mg/dL (ref 8–23)
CO2: UNDETERMINED mmol/L (ref 22–32)
CO2: 34 mmol/L — ABNORMAL HIGH (ref 22–32)
Calcium: UNDETERMINED mg/dL (ref 8.9–10.3)
Calcium: 8.5 mg/dL — ABNORMAL LOW (ref 8.9–10.3)
Chloride: UNDETERMINED mmol/L (ref 98–111)
Chloride: 97 mmol/L — ABNORMAL LOW (ref 98–111)
Creatinine, Ser: UNDETERMINED mg/dL (ref 0.61–1.24)
Creatinine, Ser: 0.89 mg/dL (ref 0.61–1.24)
GFR calc Af Amer: UNDETERMINED mL/min (ref >60)
GFR calc Af Amer: >60 mL/min (ref >60)
GFR calc non Af Amer: UNDETERMINED mL/min (ref >60)
GFR calc non Af Amer: >60 mL/min (ref >60)
Glucose, Bld: UNDETERMINED mg/dL (ref 70–99)
Glucose, Bld: 128 mg/dL — ABNORMAL HIGH (ref 70–99)
Potassium: UNDETERMINED mmol/L (ref 3.5–5.1)
Potassium: 4.7 mmol/L (ref 3.5–5.1)
Sodium: UNDETERMINED mmol/L (ref 135–145)
Sodium: 141 mmol/L (ref 135–145)
Total Bilirubin: UNDETERMINED mg/dL (ref 0.3–1.2)
Total Bilirubin: 1.5 mg/dL — ABNORMAL HIGH (ref 0.3–1.2)
Total Protein: UNDETERMINED g/dL (ref 6.5–8.1)
Total Protein: 6.7 g/dL (ref 6.5–8.1)

## 2018-12-15 LAB — TROPONIN I
Troponin I: UNDETERMINED ng/mL (ref <0.03)
Troponin I: 0.04 ng/mL (ref <0.03)
Troponin I: 0.03 ng/mL (ref <0.03)

## 2018-12-15 LAB — CBC WITH DIFFERENTIAL/PLATELET
Abs Immature Granulocytes: 0.07 10*3/uL (ref 0.00–0.07)
Basophils Absolute: 0.1 10*3/uL (ref 0.0–0.1)
Basophils Relative: 1 %
Eosinophils Absolute: 0.1 10*3/uL (ref 0.0–0.5)
Eosinophils Relative: 1 %
HCT: 63.7 % — ABNORMAL HIGH (ref 39.0–52.0)
Hemoglobin: 18.2 g/dL — ABNORMAL HIGH (ref 13.0–17.0)
Immature Granulocytes: 1 %
Lymphocytes Relative: 15 %
Lymphs Abs: 2 10*3/uL (ref 0.7–4.0)
MCH: 24.3 pg — ABNORMAL LOW (ref 26.0–34.0)
MCHC: 28.6 g/dL — ABNORMAL LOW (ref 30.0–36.0)
MCV: 84.9 fL (ref 80.0–100.0)
Monocytes Absolute: 1.5 10*3/uL — ABNORMAL HIGH (ref 0.1–1.0)
Monocytes Relative: 11 %
Neutro Abs: 9.6 10*3/uL — ABNORMAL HIGH (ref 1.7–7.7)
Neutrophils Relative %: 71 %
Platelets: 139 10*3/uL — ABNORMAL LOW (ref 150–400)
RBC: 7.5 MIL/uL — ABNORMAL HIGH (ref 4.22–5.81)
RDW: 20.8 % — ABNORMAL HIGH (ref 11.5–15.5)
WBC: 13.3 10*3/uL — ABNORMAL HIGH (ref 4.0–10.5)
nRBC: 2.4 % — ABNORMAL HIGH (ref 0.0–0.2)

## 2018-12-15 LAB — POCT I-STAT 7, (LYTES, BLD GAS, ICA,H+H)
Acid-Base Excess: 5 mmol/L — ABNORMAL HIGH (ref 0.0–2.0)
Bicarbonate: 34.7 mmol/L — ABNORMAL HIGH (ref 20.0–28.0)
Calcium, Ion: 1.15 mmol/L (ref 1.15–1.40)
HCT: 63 % — ABNORMAL HIGH (ref 39.0–52.0)
Hemoglobin: 21.4 g/dL (ref 13.0–17.0)
O2 Saturation: 93 %
Patient temperature: 99.6
Potassium: 4.4 mmol/L (ref 3.5–5.1)
Sodium: 138 mmol/L (ref 135–145)
TCO2: 37 mmol/L — ABNORMAL HIGH (ref 22–32)
pCO2 arterial: 66 mmHg (ref 32.0–48.0)
pH, Arterial: 7.332 — ABNORMAL LOW (ref 7.350–7.450)
pO2, Arterial: 76 mmHg — ABNORMAL LOW (ref 83.0–108.0)

## 2018-12-15 LAB — ECHOCARDIOGRAM COMPLETE

## 2018-12-15 LAB — MAGNESIUM: Magnesium: 2.3 mg/dL (ref 1.7–2.4)

## 2018-12-15 LAB — BRAIN NATRIURETIC PEPTIDE: B Natriuretic Peptide: 430.7 pg/mL — ABNORMAL HIGH (ref 0.0–100.0)

## 2018-12-15 LAB — PHOSPHORUS: Phosphorus: 3.6 mg/dL (ref 2.5–4.6)

## 2018-12-15 LAB — SARS CORONAVIRUS 2 BY RT PCR (HOSPITAL ORDER, PERFORMED IN ~~LOC~~ HOSPITAL LAB): SARS Coronavirus 2: NEGATIVE

## 2018-12-15 LAB — TSH: TSH: 1.262 u[IU]/mL (ref 0.350–4.500)

## 2018-12-15 LAB — LACTIC ACID, PLASMA: Lactic Acid, Venous: 1.2 mmol/L (ref 0.5–1.9)

## 2018-12-15 MED ORDER — ADULT MULTIVITAMIN W/MINERALS CH
1.0000 | ORAL_TABLET | Freq: Every day | ORAL | Status: DC
  Administered 2018-12-15 – 2018-12-18 (×3): 1 via ORAL
  Filled 2018-12-15 (×3): qty 1

## 2018-12-15 MED ORDER — ALBUTEROL SULFATE HFA 108 (90 BASE) MCG/ACT IN AERS: 4.0000 | INHALATION_SPRAY | RESPIRATORY_TRACT | Status: DC | PRN

## 2018-12-15 MED ORDER — ALBUTEROL SULFATE (2.5 MG/3ML) 0.083% IN NEBU
2.5000 mg | INHALATION_SOLUTION | RESPIRATORY_TRACT | Status: DC | PRN
  Administered 2018-12-16: 2.5 mg via RESPIRATORY_TRACT
  Filled 2018-12-15: qty 3

## 2018-12-15 MED ORDER — PERFLUTREN LIPID MICROSPHERE
4.0000 mL | Freq: Once | INTRAVENOUS | Status: AC
  Administered 2018-12-15: 4 mL via INTRAVENOUS

## 2018-12-15 MED ORDER — FUROSEMIDE 10 MG/ML IJ SOLN
40.0000 mg | Freq: Once | INTRAMUSCULAR | Status: AC
  Administered 2018-12-15: 40 mg via INTRAVENOUS
  Filled 2018-12-15: qty 4

## 2018-12-15 MED ORDER — METHYLPREDNISOLONE SODIUM SUCC 125 MG IJ SOLR
125.0000 mg | Freq: Once | INTRAMUSCULAR | Status: AC
  Administered 2018-12-15: 125 mg via INTRAVENOUS
  Filled 2018-12-15: qty 2

## 2018-12-15 MED ORDER — ACETAMINOPHEN 650 MG RE SUPP: 650.0000 mg | Freq: Four times a day (QID) | RECTAL | Status: DC | PRN

## 2018-12-15 MED ORDER — HEPARIN SODIUM (PORCINE) 5000 UNIT/ML IJ SOLN
5000.0000 [IU] | Freq: Three times a day (TID) | INTRAMUSCULAR | Status: DC
  Administered 2018-12-15 – 2018-12-17 (×7): 5000 [IU] via SUBCUTANEOUS
  Filled 2018-12-15 (×7): qty 1

## 2018-12-15 MED ORDER — ACETAMINOPHEN 325 MG PO TABS: 650.0000 mg | ORAL_TABLET | Freq: Four times a day (QID) | ORAL | Status: DC | PRN

## 2018-12-15 MED ORDER — HYDROCODONE-ACETAMINOPHEN 7.5-325 MG PO TABS
1.0000 | ORAL_TABLET | Freq: Three times a day (TID) | ORAL | Status: DC
  Administered 2018-12-15: 1 via ORAL
  Filled 2018-12-15: qty 1

## 2018-12-15 MED ORDER — SODIUM CHLORIDE 0.9% FLUSH
3.0000 mL | Freq: Two times a day (BID) | INTRAVENOUS | Status: DC
  Administered 2018-12-15 – 2018-12-18 (×6): 3 mL via INTRAVENOUS

## 2018-12-15 MED ORDER — ASPIRIN 325 MG PO TABS
325.0000 mg | ORAL_TABLET | Freq: Every day | ORAL | Status: DC
  Administered 2018-12-17 – 2018-12-18 (×2): 325 mg via ORAL
  Filled 2018-12-15 (×2): qty 1

## 2018-12-15 MED ORDER — PANTOPRAZOLE SODIUM 40 MG PO TBEC
40.0000 mg | DELAYED_RELEASE_TABLET | Freq: Every day | ORAL | Status: DC
  Administered 2018-12-17: 40 mg via ORAL
  Filled 2018-12-15 (×2): qty 1

## 2018-12-15 MED ORDER — FUROSEMIDE 10 MG/ML IJ SOLN
40.0000 mg | Freq: Two times a day (BID) | INTRAMUSCULAR | Status: AC
  Administered 2018-12-15 – 2018-12-16 (×3): 40 mg via INTRAVENOUS
  Filled 2018-12-15 (×3): qty 4

## 2018-12-15 NOTE — ED Notes (Signed)
Dr. Johnney Killian made aware of Troponin results.

## 2018-12-15 NOTE — ED Notes (Signed)
Pt sitting on side of stretcher.  Pt st's he does not want to be admitted.  Advised pt that he needed to be here due to his oxygen.  Pt's 02 sats 88% at this time but pt st's he turned down the oxygen because he does not think he needs that much 02.  02 turned back up to 4.5 LPM

## 2018-12-15 NOTE — ED Notes (Signed)
Patient titrated down to 4L O2 via nasal canula per Dr. Carmie End request. O2 sat remains at 95%

## 2018-12-15 NOTE — ED Notes (Signed)
Upon this RN entering the room, patient had removed all monitoring equipment and oxygen, O2 sat in the mid 70s. Pt alert and oriented, patient readjusted back into bed and O2 and monitoring equipment placed back on patient with explanation for why keeping equipment on is important. Patient verbalized understanding. O2 sat improved to 95%

## 2018-12-15 NOTE — ED Notes (Signed)
Dr. Patel at bedside at this time.

## 2018-12-15 NOTE — Consult Note (Addendum)
The patient has been seen in conjunction with Philip Ferries, NP. All aspects of care have been considered and discussed. The patient has been personally interviewed, examined, and all clinical data has been reviewed.   Clinical suspicion of pulmonary hypertension with right ventricular failure.  Given hypoxia and hypercapnia, findings are most compatible with cor pulmonale from chronic lung disease.  Echo will be done to assess LV function and RV function as well as quantitate pulmonary artery pressures if possible.  Diuresis as tolerated by BP, but following BP and kidney function closely.  O2 therapy and care to prevent hypercarbia and acute on chronic respiratory failure.  Pulmonary consultation.   Cardiology Consultation:   Patient ID: Philip Powell; [id number]; [birth year]   Admit date: 12/15/2018 Date of Consult: 12/15/2018  Primary Care Provider: Octavio Graves, DO Primary Cardiologist: No primary care provider on file. Dr Philip Powell saw in [place] 2015 Primary Electrophysiologist:  None   Patient Profile:   Philip Powell is a 68 y.o. male with a hx of hypoxic resp failure, COPD, OSA declined CPAP, cor pulmonale, D-CHF, polycythemia, obesity, who is being seen today for the evaluation of resp failure at the request of Dr Philip Powell.  History of Present Illness:   Philip Powell was evaluated by Dr Philip Powell in 2015, pulm HTN and RV dysfunction felt related to OSA w/ hypoxia and hypoventilation>>Cor pulmonale.   He has gotten therapeutic phlebotomy periodically.   Philip Powell reports that he has been noticing increasing hypoxia for several days.  Although his oxygen saturations have been dropping some for a while, he feels they dropped rather precipitously in the last few days.  He reports O2 saturations in the 60% range.  He has been much more short of breath than usual.  He has lower extremity edema at times, has been waking with it several times a week.  He  states he has been using BiPAP nightly, so has been sleeping on one pillow and does not wake during the night.  5 years ago, when he saw Dr. Domenic Powell, he had oxygen.  However, he no longer has oxygen and does not use it with the BiPAP at night or during the day.  He does not walk very much.  He states that on a good day he can walk the length of the house without stopping, and does not get short of breath.  However, he does not do a lot of walking outside.  He reports chest pressure with exertion, states that it comes when he starts ambulating and he will get short of breath and get chest pressure.  This has been going on for at least a month.  He does not get palpitations, has not had syncope or presyncope.   Past Medical History:  Diagnosis Date  . COPD (chronic obstructive pulmonary disease) ([place])   . Cor pulmonale, chronic ([place])   . Essential hypertension   . History of pneumonia   . Obesity   . Obstructive sleep apnea   . Polycythemia     Past Surgical History:  Procedure Laterality Date  . CARPAL TUNNEL RELEASE  2008     Prior to Admission medications   Medication Sig Start Date End Date Taking? Authorizing Provider  aspirin 325 MG tablet Take 325 mg by mouth daily.    [provider]  furosemide (LASIX) 40 MG tablet Take 1 tablet (40 mg total) by mouth daily. Patient not taking: Reported on 01/06/2018 06/16/14   Debbe Odea, MD  HYDROcodone-acetaminophen (  NORCO) 7.5-325 MG per tablet Take 1 tablet by mouth 3 (three) times daily. As needed 06/03/14   [provider]  Multiple Vitamins-Minerals (CENTRUM ADULTS) TABS Take 1 tablet by mouth daily.    [provider]  ranitidine (ZANTAC) 150 MG tablet Take 150 mg by mouth daily.     [provider]  testosterone cypionate (DEPOTESTOTERONE CYPIONATE) 100 MG/ML injection Inject 200 mg into the muscle every 14 (fourteen) days. For IM use only     [provider]    Inpatient  Medications: Scheduled Meds:  Continuous Infusions:  PRN Meds: albuterol  Allergies:   No Known Allergies  Social History:   Social History   Socioeconomic History  . Marital status: Married    Spouse name: Not on file  . Number of children: 3  . Years of education: Not on file  . Highest education level: Not on file  Occupational History  . Occupation: lpn  Social Needs  . Financial resource strain: Not on file  . Food insecurity:    Worry: Not on file    Inability: Not on file  . Transportation needs:    Medical: Not on file    Non-medical: Not on file  Tobacco Use  . Smoking status: Current Every Day Smoker    Packs/day: 1.00    Years: 43.00    Pack years: 43.00    Types: Cigarettes  . Smokeless tobacco: Never Used  . Tobacco comment: 0.5  Substance and Sexual Activity  . Alcohol use: Yes    Alcohol/week: 0.0 standard drinks    Comment: occasionally  . Drug use: No  . Sexual activity: Not Currently  Lifestyle  . Physical activity:    Days per week: Not on file    Minutes per session: Not on file  . Stress: Not on file  Relationships  . Social connections:    Talks on phone: Not on file    Gets together: Not on file    Attends religious service: Not on file    Active member of club or organization: Not on file    Attends meetings of clubs or organizations: Not on file    Relationship status: Not on file  . Intimate partner violence:    Fear of current or ex partner: Not on file    Emotionally abused: Not on file    Physically abused: Not on file    Forced sexual activity: Not on file  Other Topics Concern  . Not on file  Social History Narrative  . Not on file    Family History:   Family History  Problem Relation Age of Onset  . Cancer Brother   . Heart disease Maternal Grandfather   . Cancer Mother   . Cancer Father    Family Status:  Family Status  Relation Name Status  . Brother  (Not Specified)  . MGF  (Not Specified)  . Mother   (Not Specified)  . Father  (Not Specified)  . Other married Alive  . Child 3 Alive       good health    ROS:  Please see the history of present illness.  All other ROS reviewed and negative.     Physical Exam/Data:   Vitals:   12/15/18 1430 12/15/18 1507 12/15/18 1657 12/15/18 1700  BP: 115/79  138/90 127/84  Pulse: 100 100 95 91  Resp: (!) 30   18  Temp:      TempSrc:  SpO2:  95% 90% 91%    Intake/Output Summary (Last 24 hours) at 12/15/2018 1839 Last data filed at 12/15/2018 1837 Gross per 24 hour  Intake -  Output 675 ml  Net -675 ml   There were no vitals filed for this visit. There is no height or weight on file to calculate BMI.  General:  Well nourished, obese male, in mild-moderate respiratory distress HEENT: normal Lymph: no adenopathy Neck: no JVD seen, difficult to assess secondary to body habitus Endocrine:  No thryomegaly Vascular: No carotid bruits; 4/4 extremity pulses 2+, without bruits  Cardiac:  normal S1, S2; RRR; no murmur  Lungs: Rales bilaterally, slight expiratory wheezing, rhonchi or rales  Abd: Obese, firm, nontender, no hepatomegaly  Ext: no edema Musculoskeletal:  No deformities, BUE and BLE strength normal and equal Skin: warm and dry, chronic changes noted to the skin of both lower extremities Neuro:  CNs 2-12 intact, no focal abnormalities noted Psych:  Normal affect   EKG:  The EKG was personally reviewed and demonstrates: 5/19 ECG is sinus tach, heart rate 109, noted Telemetry:  Telemetry was personally reviewed and demonstrates: Sinus tach  Relevant CV Studies:  ECHO: 06/11/2014 - Left ventricle: The cavity size was normal. Wall thickness was increased in a pattern of moderate LVH. Systolic function was vigorous. The estimated ejection fraction was in the range of 65% to 70%. Wall motion was normal; there were no regional wall motion abnormalities. - Ventricular septum: The contour showed diastolic flattening and  systolic flattening. - Right ventricle: The cavity size was moderately dilated. Systolic function was mildly to moderately reduced. - Right atrium: The atrium was moderately dilated. - Pulmonary arteries: Systolic pressure was moderately increased. PA peak pressure: 63 mm Hg (S).  Impressions:  - Normal LV systolic functin Moderate Pulmonary Artery Hypertension   Laboratory Data:  Chemistry Recent Labs  Lab 12/15/18 1404 12/15/18 1616 12/15/18 1814  NA QUANTITY NOT SUFFICIENT, UNABLE TO PERFORM TEST 141 138  K QUANTITY NOT SUFFICIENT, UNABLE TO PERFORM TEST 4.7 4.4  CL QUANTITY NOT SUFFICIENT, UNABLE TO PERFORM TEST 97*  --   CO2 QUANTITY NOT SUFFICIENT, UNABLE TO PERFORM TEST 34*  --   GLUCOSE QUANTITY NOT SUFFICIENT, UNABLE TO PERFORM TEST 128*  --   BUN QUANTITY NOT SUFFICIENT, UNABLE TO PERFORM TEST 11  --   CREATININE QUANTITY NOT SUFFICIENT, UNABLE TO PERFORM TEST 0.89  --   CALCIUM QUANTITY NOT SUFFICIENT, UNABLE TO PERFORM TEST 8.5*  --   GFRNONAA QUANTITY NOT SUFFICIENT, UNABLE TO PERFORM TEST >60  --   GFRAA QUANTITY NOT SUFFICIENT, UNABLE TO PERFORM TEST >60  --   ANIONGAP QUANTITY NOT SUFFICIENT, UNABLE TO PERFORM TEST 10  --     Lab Results  Component Value Date   ALT 26 12/15/2018   AST 30 12/15/2018   ALKPHOS 47 12/15/2018   BILITOT 1.5 (H) 12/15/2018   Hematology Recent Labs  Lab 12/15/18 1404 12/15/18 1814  WBC 13.3*  --   RBC 7.50*  --   HGB 18.2* 21.4*  HCT 63.7* 63.0*  MCV 84.9  --   MCH 24.3*  --   MCHC 28.6*  --   RDW 20.8*  --   PLT 139*  --    Cardiac Enzymes Recent Labs  Lab 12/15/18 1404 12/15/18 1616  TROPONINI QUANTITY NOT SUFFICIENT, UNABLE TO PERFORM TEST 0.04*   No results for input(s): TROPIPOC in the last 168 hours.  BNP Recent Labs  Lab 12/15/18 1404  BNP 430.7*    TSH:  Lab Results  Component Value Date   TSH 1.82 06/24/2014   Magnesium:  Magnesium  Date Value Ref Range Status  12/15/2018 2.3 1.7 -  2.4 mg/dL Final    Comment:    Performed at [facility], [place] [address], [place], [place] [address]     Radiology/Studies:  Dg Chest Port 1 View  Result Date: 12/15/2018 CLINICAL DATA:  Shortness of breath EXAM: PORTABLE CHEST 1 VIEW COMPARISON:  Chest x-ray dated 07/12/2014 FINDINGS: The cardiac silhouette is enlarged. There is scattered hazy airspace opacities bilaterally. No pneumothorax. No large pleural effusion. There is no acute osseous abnormality. The lungs are somewhat hyperexpanded. IMPRESSION: 1. Cardiomegaly. 2. Hazy airspace opacities bilaterally may represent findings of an atypical pneumonia versus developing pulmonary edema. 3. Mildly hyperexpanded lungs which can be seen in patients with COPD. Electronically Signed   By: Constance Holster M.D.   On: 12/15/2018 14:52    Assessment and Plan:   1.  Acute on chronic right heart failure - he has volume overload by exam - Continue diuresis, but need to pay attention to right heart pressures as he had RV dysfunction on previous echo, this may have worsened - Consider right heart cath to further evaluate the pulmonary hypertension  2. Acute on chronic cor pulmonale -Suspect obesity hypoventilation and sleep apnea contributing to this and to his chronic respiratory failure -See above plan  3.  Exertional chest pain: - Follow-up on echo results - Cycle troponin - Once the above data are reviewed, decide on further testing -The chest pain may be more related to shortness of breath than coronary artery disease  Otherwise, per admitting team Principal Problem:   Acute on chronic respiratory failure with hypoxia and hypercapnia (HCC) Active Problems:   OSA (obstructive sleep apnea)   Polycythemia   Essential hypertension   Cor pulmonale, chronic ([place])     For questions or updates, please contact [facility] Please consult [email or web address] for contact info under Cardiology/STEMI.   Jonetta Speak, PA-C   12/15/2018 6:39 PM

## 2018-12-15 NOTE — ED Notes (Signed)
ED Provider at bedside. 

## 2018-12-15 NOTE — ED Notes (Signed)
Nurse navigator spoke with wife who is in parking lot. Wife dropped off 37, clothes, and cell phone. Placed at bedside. Wife stated patient trying to quit smoking. Patient refused nicotine patch at this time.

## 2018-12-15 NOTE — ED Triage Notes (Signed)
Patient reports sob that began a few days ago, also endorses a "discomfort" in his chest, he denies any other symptoms or known sick contacts. He states that he checked his O2 sat at home this am and it was in the 80s. Upon arrival patient's O2 sat in the 60s on room air. Placed on NRB and O2 sat increased to 98%. Pt a/ox4, resp labored.

## 2018-12-15 NOTE — ED Notes (Signed)
Pt given 2 urinals at bedside.

## 2018-12-15 NOTE — ED Provider Notes (Signed)
[facility] EMERGENCY DEPARTMENT Provider Note   CSN: [id number] Arrival date & time: 12/15/18  1335    History   Chief Complaint Chief Complaint  Patient presents with  . Shortness of Breath    HPI Philip Powell is a 68 y.o. male.  He has a history of COPD and cor pulmonale.  He presents to the emergency department complaining of increased shortness of breath that is been going on for a few days.  It is associated with some chest pressure.  He normally does not require oxygen and says he has been checking his oxygen at home and his sats were in the 70s and 80s.  On arrival here reportedly sats in the 60s in triage.  No cough no fever no vomiting or diarrhea.  He has not been checking his weights.  He said he was given Lasix to use as needed and he tried 40 mg on Saturday and 20 mg on Sunday.  He is a daily smoker.  History of sleep apnea.     The history is provided by the patient.  Shortness of Breath  Severity:  Severe Timing:  Constant Progression:  Unchanged Chronicity:  New Relieved by:  Nothing Worsened by:  Activity and stress Ineffective treatments:  Diuretics Associated symptoms: chest pain   Associated symptoms: no abdominal pain, no cough, no diaphoresis, no fever, no headaches, no neck pain, no rash, no sore throat, no sputum production and no vomiting   Risk factors: tobacco use     Past Medical History:  Diagnosis Date  . COPD (chronic obstructive pulmonary disease) ([place])   . Cor pulmonale, chronic ([place])   . Essential hypertension   . History of pneumonia   . Obesity   . Obstructive sleep apnea   . Polycythemia     Patient Active Problem List   Diagnosis Date Noted  . Lower abdominal pain 12/02/2017  . Cor pulmonale, chronic ([place]) 06/25/2014  . Chronic respiratory failure with hypercapnia ([place]) 06/25/2014  . Acute on chronic respiratory failure ([place]) 06/16/2014  . OSA (obstructive sleep apnea)   . Obesity   . Polycythemia    . Essential hypertension   . SOB (shortness of breath)     Past Surgical History:  Procedure Laterality Date  . CARPAL TUNNEL RELEASE  2008        Home Medications    Prior to Admission medications   Medication Sig Start Date End Date Taking? Authorizing Provider  aspirin 325 MG tablet Take 325 mg by mouth daily.    [provider]  furosemide (LASIX) 40 MG tablet Take 1 tablet (40 mg total) by mouth daily. Patient not taking: Reported on 01/06/2018 06/16/14   Debbe Odea, MD  HYDROcodone-acetaminophen (NORCO) 7.5-325 MG per tablet Take 1 tablet by mouth 3 (three) times daily. As needed 06/03/14   [provider]  Multiple Vitamins-Minerals (CENTRUM ADULTS) TABS Take 1 tablet by mouth daily.    [provider]  ranitidine (ZANTAC) 150 MG tablet Take 150 mg by mouth daily.     [provider]  testosterone cypionate (DEPOTESTOTERONE CYPIONATE) 100 MG/ML injection Inject 200 mg into the muscle every 14 (fourteen) days. For IM use only     [provider]    Family History Family History  Problem Relation Age of Onset  . Cancer Brother   . Heart disease Maternal Grandfather   . Cancer Mother   . Cancer Father     Social History Social  History   Tobacco Use  . Smoking status: Current Every Day Smoker    Packs/day: 1.00    Years: 43.00    Pack years: 43.00    Types: Cigarettes  . Smokeless tobacco: Never Used  . Tobacco comment: 0.5  Substance Use Topics  . Alcohol use: Yes    Alcohol/week: 0.0 standard drinks    Comment: occasionally  . Drug use: No     Allergies   Patient has no known allergies.   Review of Systems Review of Systems  Constitutional: Negative for diaphoresis and fever.  HENT: Negative for sore throat.   Eyes: Negative for visual disturbance.  Respiratory: Positive for shortness of breath. Negative for cough and sputum production.   Cardiovascular: Positive for chest pain and leg swelling.   Gastrointestinal: Negative for abdominal pain and vomiting.  Genitourinary: Negative for dysuria.  Musculoskeletal: Negative for neck pain.  Skin: Negative for rash.  Neurological: Negative for headaches.     Physical Exam Updated Vital Signs BP (!) 164/99   Pulse (!) 108   Temp 99.6 F (37.6 C) (Oral)   Resp 19   SpO2 98%   Physical Exam Vitals signs and nursing note reviewed.  Constitutional:      Appearance: He is well-developed.  HENT:     Head: Normocephalic and atraumatic.  Eyes:     Conjunctiva/sclera: Conjunctivae normal.  Neck:     Musculoskeletal: Neck supple.  Cardiovascular:     Rate and Rhythm: Regular rhythm. Tachycardia present.     Heart sounds: No murmur.  Pulmonary:     Effort: Tachypnea and accessory muscle usage present. No respiratory distress.     Breath sounds: Normal breath sounds.  Abdominal:     Palpations: Abdomen is soft.     Tenderness: There is no abdominal tenderness. There is no guarding or rebound.  Musculoskeletal: Normal range of motion.     Right lower leg: He exhibits no tenderness. Edema present.     Left lower leg: He exhibits no tenderness. Edema present.  Skin:    General: Skin is warm and dry.     Capillary Refill: Capillary refill takes less than 2 seconds.  Neurological:     General: No focal deficit present.     Mental Status: He is alert and oriented to person, place, and time.      ED Treatments / Results  Labs (all labs ordered are listed, but only abnormal results are displayed) Labs Reviewed  BRAIN NATRIURETIC PEPTIDE - Abnormal; Notable for the following components:      Result Value   B Natriuretic Peptide 430.7 (*)    All other components within normal limits  CBC WITH DIFFERENTIAL/PLATELET - Abnormal; Notable for the following components:   WBC 13.3 (*)    RBC 7.50 (*)    Hemoglobin 18.2 (*)    HCT 63.7 (*)    MCH 24.3 (*)    MCHC 28.6 (*)    RDW 20.8 (*)    Platelets 139 (*)    nRBC 2.4 (*)     Neutro Abs 9.6 (*)    Monocytes Absolute 1.5 (*)    All other components within normal limits  COMPREHENSIVE METABOLIC PANEL - Abnormal; Notable for the following components:   Chloride 97 (*)    CO2 34 (*)    Glucose, Bld 128 (*)    Calcium 8.5 (*)    Total Bilirubin 1.5 (*)    All other components within normal limits  TROPONIN  I - Abnormal; Notable for the following components:   Troponin I 0.04 (*)    All other components within normal limits  SARS CORONAVIRUS 2 (HOSPITAL ORDER, PERFORMED IN [facility])  CULTURE, BLOOD (ROUTINE X 2)  CULTURE, BLOOD (ROUTINE X 2)  COMPREHENSIVE METABOLIC PANEL  TROPONIN I  BLOOD GAS, ARTERIAL  MAGNESIUM  PHOSPHORUS  TSH  LACTIC ACID, PLASMA  LACTIC ACID, PLASMA    EKG EKG Interpretation  Date/Time:  Tuesday Dec 15 2018 13:45:50 EDT Ventricular Rate:  109 PR Interval:    QRS Duration: 86 QT Interval:  297 QTC Calculation: 400 R Axis:   138 Text Interpretation:  Sinus tachycardia Right ventricular hypertrophy Baseline wander in lead(s) V1 similar to prior 11/15 Confirmed by Aletta Edouard [phone]) on 12/15/2018 2:02:37 PM   Radiology Dg Chest Port 1 View  Result Date: 12/15/2018 CLINICAL DATA:  Shortness of breath EXAM: PORTABLE CHEST 1 VIEW COMPARISON:  Chest x-ray dated 07/12/2014 FINDINGS: The cardiac silhouette is enlarged. There is scattered hazy airspace opacities bilaterally. No pneumothorax. No large pleural effusion. There is no acute osseous abnormality. The lungs are somewhat hyperexpanded. IMPRESSION: 1. Cardiomegaly. 2. Hazy airspace opacities bilaterally may represent findings of an atypical pneumonia versus developing pulmonary edema. 3. Mildly hyperexpanded lungs which can be seen in patients with COPD. Electronically Signed   By: Constance Holster M.D.   On: 12/15/2018 14:52    Procedures Procedures (including critical care time)  Medications Ordered in ED Medications - No data to display   Initial  Impression / Assessment and Plan / ED Course  I have reviewed the triage vital signs and the nursing notes.  Pertinent labs & imaging results that were available during my care of the patient were reviewed by me and considered in my medical decision making (see chart for details).  Clinical Course as of Dec 14 1748  Tue Dec 15, 2018  1713 Consult: I have reviewed with Dr. Margaretann Loveless of cardiology.  They will come to do a bedside consult in the ED.   [MP]  2774 Have updated the patient's wife as to the current findings with chest x-ray and labs.  She has been updated concerning the possibility of further airway adjuncts if needed such as BiPAP or intubation.  She does advised that the patient has been very resistant to managing his underlying medical conditions.  She reports that the highest O2 sat that they obtained over the past 2 days with 74% at home.  She does feel that because of his constant smoking he has become acclimated to lower chronic oxygen levels.  Reports that he is only partially compliant with taking Lasix and will not use inhalers.  He reports he requires multiple naps through the day due to fatigue and shortness of breath.   [MP]    Clinical Course User Index [MP] Charlesetta Shanks, MD   Clovis Mankins Trussell was evaluated in Emergency Department on 12/15/2018 for the symptoms described in the history of present illness. He was evaluated in the context of the global COVID-19 pandemic, which necessitated consideration that the patient might be at risk for infection with the SARS-CoV-2 virus that causes COVID-19. Institutional protocols and algorithms that pertain to the evaluation of patients at risk for COVID-19 are in a state of rapid change based on information released by regulatory bodies including the CDC and federal and state organizations. These policies and algorithms were followed during the patient's care in the ED.     Patient  was signed out to Dr Colvin Caroli with labs and cxr  pending. Plan is likely admission for hypoxia and further workup.   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

## 2018-12-15 NOTE — ED Notes (Signed)
Nurse navigator spoke with wife who is in parking lot updated with plan of care. Wife stated she has his clothes and BiPap machine if he is admitted.

## 2018-12-15 NOTE — ED Notes (Signed)
Doctor at bedside.

## 2018-12-15 NOTE — ED Notes (Signed)
Lab called and stated that light green tube needed to be redrawn.

## 2018-12-15 NOTE — ED Notes (Signed)
Echo at bedside at this time

## 2018-12-15 NOTE — H&P (Addendum)
History and Physical    Philip Powell ERX:[id number] DOB: October 17, 1950 DOA: 12/15/2018  PCP: Octavio Graves, DO  Patient coming from: Home  I have personally briefly reviewed patient's old medical records in [place]  Chief Complaint: Shortness of breath and hypoxia  HPI: Philip Powell is a 68 y.o. male with medical history significant for cor pulmonale, chronic diastolic CHF (EF 95-09% by TTE 2015), OSA, polycythemia, and tobacco use who presents to the ED with 2 days of dyspnea on exertion, exertional central chest pressure, increased swelling in his feet and legs, and reported hypoxia at home in the 70%.  He has Lasix at home which he uses only as needed and has tried in these last 2 days without significant improvement.  He otherwise denies any associated fevers, chills, diaphoresis, nausea, vomiting, abdominal pain, dysuria, or palpitations.  He has OSA/OHS and reports using BiPAP nightly.  He is a chronic smoker of 1 pack/day for at least 50 years last smoked 2 days ago.  He reports occasional alcohol use and denies any illicit drug use.  ED Course:  Initial vitals showed BP 120/81, pulse 107, RR 18, temp 99.6 Fahrenheit, SPO2 95% on 5 L supplemental O2 via nasal cannula.  Labs are notable for WBC 13.3, hemoglobin 18.2, platelets 139,000, potassium 4.7, sodium 141, bicarb 34, BUN 11, creatinine 0.89, serum glucose 128, troponin I 0.04.  BNP 430.7.  Magnesium 2.3, phosphorus 3.6, TSH 1.262.  ABG showed pH 7.332, PCO2 66, PO2 76.  Portable chest x-ray showed bilateral hazy opacities suggestive of pulmonary edema.  EKG showed sinus tachycardia without acute ischemic changes.  Patient was given IV Solu-Medrol 125 mg and IV Lasix 40 mg.  Cardiology was consulted and recommends stat echocardiogram and hospitalist admission for diuresis and potential right heart cath during admission.  Review of Systems: All systems reviewed and are negative except as documented in history  of present illness above.   Past Medical History:  Diagnosis Date  . COPD (chronic obstructive pulmonary disease) ([place])   . Cor pulmonale, chronic ([place])   . Essential hypertension   . History of pneumonia   . Obesity   . Obstructive sleep apnea   . Polycythemia     Past Surgical History:  Procedure Laterality Date  . CARPAL TUNNEL RELEASE  2008    Social History:  reports that he has been smoking cigarettes. He has a 43.00 pack-year smoking history. He has never used smokeless tobacco. He reports current alcohol use. He reports that he does not use drugs.  No Known Allergies  Family History  Problem Relation Age of Onset  . Cancer Brother   . Heart disease Maternal Grandfather   . Cancer Mother   . Cancer Father      Prior to Admission medications   Medication Sig Start Date End Date Taking? Authorizing Provider  Ascorbic Acid (VITAMIN C PO) Take 1 tablet by mouth daily.   Yes [provider]  aspirin 325 MG tablet Take 325 mg by mouth daily.   Yes [provider]  furosemide (LASIX) 40 MG tablet Take 1 tablet (40 mg total) by mouth daily. Patient taking differently: Take 60 mg by mouth daily as needed for fluid.  06/16/14  Yes Debbe Odea, MD  HYDROcodone-acetaminophen (NORCO) 7.5-325 MG per tablet Take 1 tablet by mouth 3 (three) times daily.  06/03/14  Yes [provider]  ibuprofen (ADVIL) 200 MG tablet Take 400 mg by mouth every 6 (six) hours as  needed for mild pain.   Yes [provider]  Multiple Vitamins-Minerals (CENTRUM ADULTS) TABS Take 1 tablet by mouth daily.   Yes [provider]  omeprazole (PRILOSEC) 20 MG capsule Take 20 mg by mouth daily as needed (acid reflux).   Yes [provider]  testosterone cypionate (DEPOTESTOTERONE CYPIONATE) 100 MG/ML injection Inject 100 mg into the muscle every 14 (fourteen) days. For IM use only    Yes [provider]    Physical Exam: Vitals:   12/15/18 1700  12/15/18 1800 12/15/18 1815 12/15/18 1830  BP: 127/84 138/90  (!) 142/77  Pulse: 91 92 95 96  Resp: 18 (!) 25 19 (!) 27  Temp:      TempSrc:      SpO2: 91% (!) 89% 90% 92%    Constitutional: Obese man sitting up at the side of the bed, NAD, calm, comfortable Eyes: PERRL, lids and conjunctivae normal ENMT: Mucous membranes are moist. Posterior pharynx clear of any exudate or lesions.Normal dentition.  Neck: normal, supple, no masses. Respiratory: Expiratory wheezing bilaterally.  Normal respiratory effort. No accessory muscle use.  Cardiovascular: Regular rate and rhythm, systolic murmur present.  +1 pitting edema bilateral lower extremities.   Abdomen: no tenderness, no masses palpated. No hepatosplenomegaly. Bowel sounds positive.  Musculoskeletal: no clubbing / cyanosis. No joint deformity upper and lower extremities. Good ROM, no contractures. Normal muscle tone.  Skin: Venous stasis dermatitis of lower extremities. Neurologic: CN 2-12 grossly intact. Sensation intact, Strength 5/5 in all 4.  Psychiatric: Normal judgment and insight. Alert and oriented x 3.    Labs on Admission: I have personally reviewed following labs and imaging studies  CBC: Recent Labs  Lab 12/15/18 1404 12/15/18 1814  WBC 13.3*  --   NEUTROABS 9.6*  --   HGB 18.2* 21.4*  HCT 63.7* 63.0*  MCV 84.9  --   PLT 139*  --    Basic Metabolic Panel: Recent Labs  Lab 12/15/18 1404 12/15/18 1616 12/15/18 1736 12/15/18 1814  NA QUANTITY NOT SUFFICIENT, UNABLE TO PERFORM TEST 141  --  138  K QUANTITY NOT SUFFICIENT, UNABLE TO PERFORM TEST 4.7  --  4.4  CL QUANTITY NOT SUFFICIENT, UNABLE TO PERFORM TEST 97*  --   --   CO2 QUANTITY NOT SUFFICIENT, UNABLE TO PERFORM TEST 34*  --   --   GLUCOSE QUANTITY NOT SUFFICIENT, UNABLE TO PERFORM TEST 128*  --   --   BUN QUANTITY NOT SUFFICIENT, UNABLE TO PERFORM TEST 11  --   --   CREATININE QUANTITY NOT SUFFICIENT, UNABLE TO PERFORM TEST 0.89  --   --   CALCIUM  QUANTITY NOT SUFFICIENT, UNABLE TO PERFORM TEST 8.5*  --   --   MG  --   --  2.3  --   PHOS  --   --  3.6  --    GFR: CrCl cannot be calculated (Unknown ideal weight.). Liver Function Tests: Recent Labs  Lab 12/15/18 1404 12/15/18 1616  AST QUANTITY NOT SUFFICIENT, UNABLE TO PERFORM TEST 30  ALT QUANTITY NOT SUFFICIENT, UNABLE TO PERFORM TEST 26  ALKPHOS QUANTITY NOT SUFFICIENT, UNABLE TO PERFORM TEST 47  BILITOT QUANTITY NOT SUFFICIENT, UNABLE TO PERFORM TEST 1.5*  PROT QUANTITY NOT SUFFICIENT, UNABLE TO PERFORM TEST 6.7  ALBUMIN QUANTITY NOT SUFFICIENT, UNABLE TO PERFORM TEST 3.7   No results for input(s): LIPASE, AMYLASE in the last 168 hours. No results for input(s): AMMONIA in the last 168 hours. Coagulation Profile:  No results for input(s): INR, PROTIME in the last 168 hours. Cardiac Enzymes: Recent Labs  Lab 12/15/18 1404 12/15/18 1616  TROPONINI QUANTITY NOT SUFFICIENT, UNABLE TO PERFORM TEST 0.04*   BNP (last 3 results) No results for input(s): PROBNP in the last 8760 hours. HbA1C: No results for input(s): HGBA1C in the last 72 hours. CBG: No results for input(s): GLUCAP in the last 168 hours. Lipid Profile: No results for input(s): CHOL, HDL, LDLCALC, TRIG, CHOLHDL, LDLDIRECT in the last 72 hours. Thyroid Function Tests: Recent Labs    12/15/18 1736  TSH 1.262   Anemia Panel: No results for input(s): VITAMINB12, FOLATE, FERRITIN, TIBC, IRON, RETICCTPCT in the last 72 hours. Urine analysis:    Component Value Date/Time   COLORURINE YELLOW 11/18/2016 [place] 11/18/2016 1634   LABSPEC 1.008 11/18/2016 1634   PHURINE 5.0 11/18/2016 1634   GLUCOSEU NEGATIVE 11/18/2016 1634   HGBUR NEGATIVE 11/18/2016 1634   BILIRUBINUR NEGATIVE 11/18/2016 1634   KETONESUR NEGATIVE 11/18/2016 1634   PROTEINUR NEGATIVE 11/18/2016 1634   NITRITE NEGATIVE 11/18/2016 1634   LEUKOCYTESUR LARGE (A) 11/18/2016 1634    Radiological Exams on Admission: Dg  Chest Port 1 View  Result Date: 12/15/2018 CLINICAL DATA:  Shortness of breath EXAM: PORTABLE CHEST 1 VIEW COMPARISON:  Chest x-ray dated 07/12/2014 FINDINGS: The cardiac silhouette is enlarged. There is scattered hazy airspace opacities bilaterally. No pneumothorax. No large pleural effusion. There is no acute osseous abnormality. The lungs are somewhat hyperexpanded. IMPRESSION: 1. Cardiomegaly. 2. Hazy airspace opacities bilaterally may represent findings of an atypical pneumonia versus developing pulmonary edema. 3. Mildly hyperexpanded lungs which can be seen in patients with COPD. Electronically Signed   By: Constance Holster M.D.   On: 12/15/2018 14:52    EKG: Independently reviewed. Sinus tachycardia, wandering leads.  Rate faster when compared to prior.  Assessment/Plan Principal Problem:   Acute on chronic respiratory failure with hypoxia and hypercapnia (HCC) Active Problems:   OSA (obstructive sleep apnea)   Polycythemia   Essential hypertension   Cor pulmonale, chronic (HCC)  Philip Powell is a 68 y.o. male with medical history significant for cor pulmonale, chronic diastolic CHF (EF 32-20% by TTE 2015), OSA, polycythemia, and tobacco use who presents to the ED with acute on chronic respiratory failure with hypoxia and hypercapnia due to exacerbation of chronic right heart failure and cor pulmonale.   Acute on chronic respiratory failure with hypoxia and hypercapnia secondary to acute on chronic right heart failure and cor pulmonale: 825 cc urine output with initial IV Lasix.  Cardiology consulted and stat echocardiogram obtained, read pending. -Continue IV Lasix 40 mg twice daily, monitor closely as not to overdiuresis given RV dysfunction -Daily weights, strict I/O's -Admit to PCU -Cardiology following, may pursue right heart cath during admission -Will make n.p.o. at midnight  Exertional chest pain: Initial troponin 0.04.  We will cycle cardiac enzymes and continue  aspirin.  Polycythemia: Chronic and likely secondary to tobacco use and chronic pulmonary disease/OHS.  Undergoes therapeutic phlebotomy by his PCP.  OSA/OHS: Reports adherence to BiPAP nightly, has not seen his pulmonologist in 2 years. -Continue BiPAP nightly  Chronic osteoarthritis pain: -Continue home Norco  Suspect COPD: Chronic smoking history with expiratory wheezing throughout the lung fields.  Received IV Solu-Medrol in ED. -As needed albuterol nebulizers  Tobacco use: -Patient declines nicotine patch   DVT prophylaxis: Subcutaneous heparin Code Status: Full code, confirmed with patient Family Communication: None present on admission Disposition Plan:  Pending clinical progress Consults called: Cardiology Admission status: Inpatient, likely requires greater than 2 midnight length stay for evaluation and management of acute on chronic respiratory failure with hypoxia, cor pulmonale, heart failure, and exertional chest discomfort.   Zada Finders MD [facility]  If 7PM-7AM, please contact night-coverage [email or web address]  12/15/2018, 7:56 PM

## 2018-12-15 NOTE — ED Notes (Signed)
Dr. Tamala Julian cardiologist  And Liliana Cline. PA in to assess pt at this time

## 2018-12-15 NOTE — ED Provider Notes (Addendum)
Pulm HTN/CHF. No home 02 at baseline. Measures Sats at home, usu low 90s now past week getting readings in 70s. Will need admission. Sees Dr. Melvyn Novas pulmonology. Physical Exam  BP 127/84   Pulse 91   Temp 99.6 F (37.6 C) (Oral)   Resp 18   SpO2 91%   Physical Exam Constitutional:      Comments: Patient has morbid central obesity.  He is alert but has just slight appearance of disorientation with adjusting his blood pressure cuff.  He has a moderate increased work of breathing.  He has a slightly plethoric appearance to him as he is in a semi-recumbent position in the stretcher.  Speaking to me in full sentences and is appropriately oriented to the situation.  He does endorse feeling just slightly disoriented but not really confused.  HENT:     Head: Normocephalic and atraumatic.  Eyes:     Extraocular Movements: Extraocular movements intact.  Cardiovascular:     Comments: Borderline tachycardia.  I cannot appreciate significant murmur or gallop, is however significant background noise. Pulmonary:     Comments: Moderate increased work of breathing with tachypnea.  Patient is speaking in full sentences.  Breath sounds very soft to the mid lung fields.  I cannot appreciate crackles.  Breath sounds are audible from about the mid to top quarter without gross wheeze or rhonchi. Abdominal:     Comments: Very obese central abdomen.  Nontender.  Musculoskeletal:     Comments: Changes of chronic venous stasis with thinned and hyperpigmented skin bilaterally bilaterally legs are fairly taut.  Nontender.  Skin:    Comments: Patient is slightly diaphoretic on the forehead.  Skin is pink and warm.  Neurological:     Comments: Although patient has a slightly disoriented appearance that he is fiddling with his blood pressure cuff, he is appropriate and recognizes his surroundings well.  He also is well control of the historical factors.  He also is able to recognize that he has just the slightest hint of  feeling of disorientation.     ED Course/Procedures   Clinical Course as of Dec 14 1845  Tue Dec 15, 2018  1713 Consult: I have reviewed with Dr. Margaretann Loveless of cardiology.  They will come to do a bedside consult in the ED.   [MP]  9417 Have updated the patient's wife as to the current findings with chest x-ray and labs.  She has been updated concerning the possibility of further airway adjuncts if needed such as BiPAP or intubation.  She does advised that the patient has been very resistant to managing his underlying medical conditions.  She reports that the highest O2 sat that they obtained over the past 2 days with 74% at home.  She does feel that because of his constant smoking he has become acclimated to lower chronic oxygen levels.  Reports that he is only partially compliant with taking Lasix and will not use inhalers.  He reports he requires multiple naps through the day due to fatigue and shortness of breath.   [MP]  [id number] Cardiology consulted: Seen by Dr. Tamala Julian and Suanne Marker.  Highest suspicion is for right heart failure with history of pulmonary hypertension\COPD.  They will expedite echo for confirmation.  Plan for admission to medical service and they will continue consultation.   [MP]    Clinical Course User Index [MP] Charlesetta Shanks, MD    Procedures CRITICAL CARE Performed by: Charlesetta Shanks   Total critical care time: 30 minutes  Critical care time was exclusive of separately billable procedures and treating other patients.  Critical care was necessary to treat or prevent imminent or life-threatening deterioration.  Critical care was time spent personally by me on the following activities: development of treatment plan with patient and/or surrogate as well as nursing, discussions with consultants, evaluation of patient's response to treatment, examination of patient, obtaining history from patient or surrogate, ordering and performing treatments and interventions, ordering and  review of laboratory studies, ordering and review of radiographic studies, pulse oximetry and re-evaluation of patient's condition. MDM   Patient has significant hypoxia from what is his reported baseline.  He is requiring 3 to 4 L of oxygen to maintain an oxygen saturation of 90%.  Patient is much improved in a clinical appearance when he is sitting upright at the edge of the bed as opposed to semi-recumbent in the stretcher.  Unfortunately, patient's metabolic panel was delayed due to difficulty with the for specimen.  This is still pending.  I have however reviewed the patient's chest x-ray which shows gross cardiomegaly and fluffy infiltrate bilaterally that is most suggestive of CHF.  Patient's history is less suggestive of coronavirus.  His rapid test has come back negative.  At this time, with the patient having moderate work of breathing and high suspicion for CHF I have ordered for Lasix 40 mg IV.  Patient is also an ongoing smoker with history of COPD.  Albuterol ordered.  Cardiology has been consulted to try to expedite evaluation and possible echo to further evaluate the underlying etiologies here.  Patient has been seen by cardiology.  Admission to medical service.  He is showing some improvement since receiving Lasix and is diuresing.  His mental status is remaining stable and there has not been an advancement in terms of delirium.  Compared to first assessment, patient looks improved at time of admission.      Charlesetta Shanks, MD 12/15/18 1528    Charlesetta Shanks, MD 12/15/18 Osborn Coho, MD 12/15/18 [phone]

## 2018-12-16 ENCOUNTER — Inpatient Hospital Stay (HOSPITAL_COMMUNITY): Payer: Medicare Other

## 2018-12-16 ENCOUNTER — Telehealth: Payer: Self-pay

## 2018-12-16 DIAGNOSIS — G9341 Metabolic encephalopathy: Secondary | ICD-10-CM

## 2018-12-16 DIAGNOSIS — G4733 Obstructive sleep apnea (adult) (pediatric): Secondary | ICD-10-CM

## 2018-12-16 DIAGNOSIS — J9621 Acute and chronic respiratory failure with hypoxia: Secondary | ICD-10-CM

## 2018-12-16 DIAGNOSIS — I5031 Acute diastolic (congestive) heart failure: Secondary | ICD-10-CM

## 2018-12-16 DIAGNOSIS — I10 Essential (primary) hypertension: Secondary | ICD-10-CM

## 2018-12-16 DIAGNOSIS — R0689 Other abnormalities of breathing: Secondary | ICD-10-CM

## 2018-12-16 DIAGNOSIS — D751 Secondary polycythemia: Secondary | ICD-10-CM

## 2018-12-16 DIAGNOSIS — J9622 Acute and chronic respiratory failure with hypercapnia: Secondary | ICD-10-CM

## 2018-12-16 LAB — CBC
HCT: 63.8 % — ABNORMAL HIGH (ref 39.0–52.0)
HCT: 62.5 % — ABNORMAL HIGH (ref 39.0–52.0)
Hemoglobin: 18.3 g/dL — ABNORMAL HIGH (ref 13.0–17.0)
Hemoglobin: 18.1 g/dL — ABNORMAL HIGH (ref 13.0–17.0)
MCH: 24.2 pg — ABNORMAL LOW (ref 26.0–34.0)
MCH: 24.3 pg — ABNORMAL LOW (ref 26.0–34.0)
MCHC: 28.7 g/dL — ABNORMAL LOW (ref 30.0–36.0)
MCHC: 29 g/dL — ABNORMAL LOW (ref 30.0–36.0)
MCV: 84.3 fL (ref 80.0–100.0)
MCV: 84 fL (ref 80.0–100.0)
Platelets: 131 10*3/uL — ABNORMAL LOW (ref 150–400)
Platelets: 125 10*3/uL — ABNORMAL LOW (ref 150–400)
RBC: 7.57 MIL/uL — ABNORMAL HIGH (ref 4.22–5.81)
RBC: 7.44 MIL/uL — ABNORMAL HIGH (ref 4.22–5.81)
RDW: 20.9 % — ABNORMAL HIGH (ref 11.5–15.5)
RDW: 20.6 % — ABNORMAL HIGH (ref 11.5–15.5)
WBC: 14.2 10*3/uL — ABNORMAL HIGH (ref 4.0–10.5)
WBC: 12.7 10*3/uL — ABNORMAL HIGH (ref 4.0–10.5)
nRBC: 1.5 % — ABNORMAL HIGH (ref 0.0–0.2)
nRBC: 1.8 % — ABNORMAL HIGH (ref 0.0–0.2)

## 2018-12-16 LAB — BASIC METABOLIC PANEL
Anion gap: 12 (ref 5–15)
Anion gap: 11 (ref 5–15)
BUN: 14 mg/dL (ref 8–23)
BUN: 15 mg/dL (ref 8–23)
CO2: 37 mmol/L — ABNORMAL HIGH (ref 22–32)
CO2: 34 mmol/L — ABNORMAL HIGH (ref 22–32)
Calcium: 8.5 mg/dL — ABNORMAL LOW (ref 8.9–10.3)
Calcium: 8.2 mg/dL — ABNORMAL LOW (ref 8.9–10.3)
Chloride: 91 mmol/L — ABNORMAL LOW (ref 98–111)
Chloride: 95 mmol/L — ABNORMAL LOW (ref 98–111)
Creatinine, Ser: 0.88 mg/dL (ref 0.61–1.24)
Creatinine, Ser: 0.8 mg/dL (ref 0.61–1.24)
GFR calc Af Amer: >60 mL/min (ref >60)
GFR calc Af Amer: >60 mL/min (ref >60)
GFR calc non Af Amer: >60 mL/min (ref >60)
GFR calc non Af Amer: >60 mL/min (ref >60)
Glucose, Bld: 147 mg/dL — ABNORMAL HIGH (ref 70–99)
Glucose, Bld: 174 mg/dL — ABNORMAL HIGH (ref 70–99)
Potassium: 4.7 mmol/L (ref 3.5–5.1)
Potassium: 4.7 mmol/L (ref 3.5–5.1)
Sodium: 140 mmol/L (ref 135–145)
Sodium: 140 mmol/L (ref 135–145)

## 2018-12-16 LAB — BLOOD GAS, ARTERIAL
Acid-Base Excess: 13.9 mmol/L — ABNORMAL HIGH (ref 0.0–2.0)
Bicarbonate: 41.2 mmol/L — ABNORMAL HIGH (ref 20.0–28.0)
Drawn by: 560071
O2 Content: 6 L/min
O2 Saturation: 93.4 %
Patient temperature: 98.6
pCO2 arterial: 94 mmHg (ref 32.0–48.0)
pH, Arterial: 7.264 — ABNORMAL LOW (ref 7.350–7.450)
pO2, Arterial: 76.4 mmHg — ABNORMAL LOW (ref 83.0–108.0)

## 2018-12-16 LAB — TROPONIN I
Troponin I: <0.03 ng/mL (ref <0.03)
Troponin I: <0.03 ng/mL (ref <0.03)
Troponin I: <0.03 ng/mL (ref <0.03)
Troponin I: <0.03 ng/mL (ref <0.03)

## 2018-12-16 LAB — HIV ANTIBODY (ROUTINE TESTING W REFLEX): HIV Screen 4th Generation wRfx: NONREACTIVE

## 2018-12-16 LAB — MAGNESIUM: Magnesium: 2.3 mg/dL (ref 1.7–2.4)

## 2018-12-16 LAB — GLUCOSE, CAPILLARY: Glucose-Capillary: 156 mg/dL — ABNORMAL HIGH (ref 70–99)

## 2018-12-16 LAB — PROCALCITONIN: Procalcitonin: 0.12 ng/mL

## 2018-12-16 LAB — LACTIC ACID, PLASMA
Lactic Acid, Venous: 1.4 mmol/L (ref 0.5–1.9)
Lactic Acid, Venous: 1.1 mmol/L (ref 0.5–1.9)

## 2018-12-16 MED ORDER — METHYLPREDNISOLONE SODIUM SUCC 125 MG IJ SOLR
INTRAMUSCULAR | Status: AC
  Administered 2018-12-16: 60 mg via INTRAVENOUS
  Filled 2018-12-16: qty 2

## 2018-12-16 MED ORDER — METHYLPREDNISOLONE SODIUM SUCC 125 MG IJ SOLR
60.0000 mg | Freq: Two times a day (BID) | INTRAMUSCULAR | Status: DC
  Administered 2018-12-16: 60 mg via INTRAVENOUS

## 2018-12-16 MED ORDER — IPRATROPIUM-ALBUTEROL 0.5-2.5 (3) MG/3ML IN SOLN
3.0000 mL | Freq: Three times a day (TID) | RESPIRATORY_TRACT | Status: DC
  Administered 2018-12-16: 3 mL via RESPIRATORY_TRACT
  Filled 2018-12-16 (×2): qty 3

## 2018-12-16 MED ORDER — METHYLPREDNISOLONE SODIUM SUCC 40 MG IJ SOLR
40.0000 mg | Freq: Every day | INTRAMUSCULAR | Status: DC
  Administered 2018-12-17 – 2018-12-18 (×2): 40 mg via INTRAVENOUS
  Filled 2018-12-16 (×2): qty 1

## 2018-12-16 MED ORDER — HYDROCODONE-ACETAMINOPHEN 5-325 MG PO TABS
1.0000 | ORAL_TABLET | Freq: Three times a day (TID) | ORAL | Status: DC | PRN
  Administered 2018-12-16 – 2018-12-18 (×4): 1 via ORAL
  Filled 2018-12-16 (×4): qty 1

## 2018-12-16 MED ORDER — SODIUM CHLORIDE 0.9 % IV SOLN
500.0000 mg | INTRAVENOUS | Status: DC
  Administered 2018-12-16 – 2018-12-18 (×3): 500 mg via INTRAVENOUS
  Filled 2018-12-16 (×3): qty 500

## 2018-12-16 MED ORDER — IPRATROPIUM-ALBUTEROL 0.5-2.5 (3) MG/3ML IN SOLN
3.0000 mL | Freq: Four times a day (QID) | RESPIRATORY_TRACT | Status: DC
  Administered 2018-12-16 (×2): 3 mL via RESPIRATORY_TRACT
  Filled 2018-12-16 (×2): qty 3

## 2018-12-16 MED ORDER — SODIUM CHLORIDE 0.9 % IV SOLN
1.0000 g | INTRAVENOUS | Status: DC
  Administered 2018-12-16 – 2018-12-18 (×3): 1 g via INTRAVENOUS
  Filled 2018-12-16 (×3): qty 10

## 2018-12-16 NOTE — Significant Event (Signed)
Rapid Response Event Note RN called for neuro assessment  Overview:  Dr. Tyrell Antonio at bedside requesting a neuro assessment due to increase lethargy and repeating "yes" to every question.     Initial Focused Assessment: On arrival pt lethargic, AMS, answering "yes" to my questions, skin clammy, BS wheezing and crackles through out and tachypneic. During neuro assessment pt more alert, follows all commands and answers all questions appropriately. He requested we call his wife to update her about placing him on Bipap and asking for water.   Interventions: Bipap 22 g LW 40 mg Lasix IVP 125 mg Solumedrol IVP Duoneb tx  Plan of Care (if not transferred): Continue to monitor, call RRT as needed Event Summary:   at      at          [facility], Sela Hua

## 2018-12-16 NOTE — Progress Notes (Signed)
PROGRESS NOTE    Philip Powell  LHT:[id number] DOB: 1951/05/31 DOA: 12/15/2018 PCP: Octavio Graves, DO    Brief Narrative: 68 year old male with past medical history significant for cor pulmonale, chronic diastolic heart failure last ejection fraction 65% on TTE 2015, OSA, polycythemia, tobacco use who presents to the ED complaining of shortness of breath, dyspnea on exertion and chest pressure and worsening lower extremity edema that is started 2 days prior to admission.  Patient also report oxygen level at home 70%.  Patient uses Lasix at home on as-needed basis, he has been using it for the last 2 days without any improvement. Evaluation in the ED patient oxygen saturation 95% on 5 L of oxygen, hemoglobin 18 white blood cell 13 BUN 11 creatinine 0.8, BNP 430.  ABG pH 7.3 PCO2 66 PO2 76.  A chest x-ray show bilateral hazy opacities suggestive of pulmonary edema.  EKG showed sinus tachycardia without acute ischemic changes. Patient was given IV Solu-Medrol and IV Lasix in the ED.  Cardiology was consulted who recommended stat echo and hospitalist for admission for IV diuresis.  On 5/20 patient was noted to be more lethargic, incoherent, repeating himself over and over.  Repeated blood gas showed PCO2 94 and a pH at 7.2.  Patient was placed on BiPAP, with improvement of mental status. Pulmonary has been consulted as well for further evaluation and treatment of cor pulmonale.  Cardiology recommendation.   Assessment & Plan:   Principal Problem:   Acute on chronic respiratory failure with hypoxia and hypercapnia (HCC) Active Problems:   OSA (obstructive sleep apnea)   Polycythemia   Essential hypertension   Cor pulmonale, chronic (HCC)  1-acute hypoxic, hypercarbic respiratory failure: Multifactorial secondary to COPD exacerbation and heart failure exacerbation.  Cor pulmonale. Continue with IV Lasix 40 mg IV twice daily.  He had good urine output yesterday of 2.2 L.  He is -1.5 L.  Continue with IV Solu-Medrol 60 mg IV twice daily. Patient developed worsening hypercapnic respiratory failure the morning of 5/20: Place patient on BiPAP this morning, due to worsening hypercapnia and altered mental status. Start scheduled nebulizer treatments  2-acute metabolic encephalopathy: Patient lethargic the morning of 5/20, PCO2 at 94. CT head negative. Patient mental status improved on BiPAP.  3-exertional chest pain: Repeat EKG this morning.  Continue to cycle enzymes.  Cardiology following. Polycythemia: Chronic and likely secondary to tobacco use and chronic pulmonary disease OHS. Undergoes therapeutic phlebotomy by his PCP Continue with aspirin.  OSA/OHS: Continue with BiPAP  Chronic osteoarthritis: We will discontinue scheduled Norco.  Will order PRN to avoid withdrawal.  PNA;  Chest x-ray with questionable infiltrate, present with leukocytosis. Due to acute decompensation this morning I will start IV ceftriaxone and azithromycin to cover for pneumonia.  Estimated body mass index is 42.76 kg/m as calculated from the following:   Height as of this encounter: 6' (1.829 m).   Weight as of this encounter: 143 kg.   DVT prophylaxis: Heparin  code Status: Full code Family Communication: I have updated wife this morning, plan of care and events of this morning Disposition Plan: Remain in the hospital for continuation of BiPAP, IV Lasix, IV Solu-Medrol and nebulizer treatment.  Patient respiratory status is not a stable   Consultants:   Cardiology  Pulmonology   Procedures:  Echo;  The left ventricle has normal systolic function with an ejection fraction of 60-65%. The cavity size was normal. There is mildly increased left ventricular wall thickness. Left ventricular diastolic  Doppler parameters are consistent with impaired  relaxation. There is right ventricular volume and pressure overload and respiratory related leftward septal shift, likely secondary to  pulmonary disease, cannot exclude pericardial constriction.  2. The right ventricle has preserved function at the base and mid ventricle, with moderately reduced function at the RV apex. The cavity was moderately enlarged. There is no increase in right ventricular wall thickness. Right ventricular systolic  pressure is severely elevated with an estimated pressure of 71.7 mmHg.  3. Sclerosis without any evidence of stenosis of the aortic valve.  4. Right atrial size was mildly dilated.     Antimicrobials:  Ceftriaxone azithromycin 5/20   Subjective: Lethargic this morning following few commands.  Repeats himself over and over  Objective: Vitals:   12/16/18 0500 12/16/18 0642 12/16/18 0800 12/16/18 0818  BP: 132/76  (!) 151/117   Pulse: 80  (!) 104   Resp: 18  20   Temp: 98.4 F (36.9 C)  97.7 F (36.5 C)   TempSrc: Oral  Oral   SpO2: 93%  92% 93%  Weight:  (!) 143 kg    Height:        Intake/Output Summary (Last 24 hours) at 12/16/2018 6578 Last data filed at 12/16/2018 4696 Gross per 24 hour  Intake 723 ml  Output 2275 ml  Net -1552 ml   Filed Weights   12/15/18 2133 12/16/18 0642  Weight: (!) 144.6 kg (!) 143 kg    Examination:  General exam: Lethargic Respiratory system: Lateral diffuse wheezing and crackles, tachypneic Cardiovascular system: S1 & S2 heard, RRR. No JVD, murmurs, rubs, gallops or clicks. No pedal edema. Gastrointestinal system: Abdomen is nondistended, soft and nontender. No organomegaly or masses felt. Normal bowel sounds heard. Central nervous system: He is lethargic he follows some commands no focal deficit she ate at home Limited evaluation Extremities: Symmetric 5 x 5 power. Skin: No rashes, lesions or ulcers    Data Reviewed: I have personally reviewed following labs and imaging studies  CBC: Recent Labs  Lab 12/15/18 1404 12/15/18 1814 12/16/18 0510  WBC 13.3*  --  14.2*  NEUTROABS 9.6*  --   --   HGB 18.2* 21.4* 18.3*  HCT  63.7* 63.0* 63.8*  MCV 84.9  --  84.3  PLT 139*  --  295*   Basic Metabolic Panel: Recent Labs  Lab 12/15/18 1404 12/15/18 1616 12/15/18 1736 12/15/18 1814 12/16/18 0510  NA QUANTITY NOT SUFFICIENT, UNABLE TO PERFORM TEST 141  --  138 140  K QUANTITY NOT SUFFICIENT, UNABLE TO PERFORM TEST 4.7  --  4.4 4.7  CL QUANTITY NOT SUFFICIENT, UNABLE TO PERFORM TEST 97*  --   --  91*  CO2 QUANTITY NOT SUFFICIENT, UNABLE TO PERFORM TEST 34*  --   --  37*  GLUCOSE QUANTITY NOT SUFFICIENT, UNABLE TO PERFORM TEST 128*  --   --  147*  BUN QUANTITY NOT SUFFICIENT, UNABLE TO PERFORM TEST 11  --   --  14  CREATININE QUANTITY NOT SUFFICIENT, UNABLE TO PERFORM TEST 0.89  --   --  0.88  CALCIUM QUANTITY NOT SUFFICIENT, UNABLE TO PERFORM TEST 8.5*  --   --  8.5*  MG  --   --  2.3  --  2.3  PHOS  --   --  3.6  --   --    GFR: Estimated Creatinine Clearance: 122.9 mL/min (by C-G formula based on SCr of 0.88 mg/dL). Liver Function Tests: Recent Labs  Lab  12/15/18 1404 12/15/18 1616  AST QUANTITY NOT SUFFICIENT, UNABLE TO PERFORM TEST 30  ALT QUANTITY NOT SUFFICIENT, UNABLE TO PERFORM TEST 26  ALKPHOS QUANTITY NOT SUFFICIENT, UNABLE TO PERFORM TEST 47  BILITOT QUANTITY NOT SUFFICIENT, UNABLE TO PERFORM TEST 1.5*  PROT QUANTITY NOT SUFFICIENT, UNABLE TO PERFORM TEST 6.7  ALBUMIN QUANTITY NOT SUFFICIENT, UNABLE TO PERFORM TEST 3.7   No results for input(s): LIPASE, AMYLASE in the last 168 hours. No results for input(s): AMMONIA in the last 168 hours. Coagulation Profile: No results for input(s): INR, PROTIME in the last 168 hours. Cardiac Enzymes: Recent Labs  Lab 12/15/18 1404 12/15/18 1616 12/15/18 2115 12/16/18 0510  TROPONINI QUANTITY NOT SUFFICIENT, UNABLE TO PERFORM TEST 0.04* 0.03* <0.03   BNP (last 3 results) No results for input(s): PROBNP in the last 8760 hours. HbA1C: No results for input(s): HGBA1C in the last 72 hours. CBG: Recent Labs  Lab 12/16/18 0814  GLUCAP 156*    Lipid Profile: No results for input(s): CHOL, HDL, LDLCALC, TRIG, CHOLHDL, LDLDIRECT in the last 72 hours. Thyroid Function Tests: Recent Labs    12/15/18 1736  TSH 1.262   Anemia Panel: No results for input(s): VITAMINB12, FOLATE, FERRITIN, TIBC, IRON, RETICCTPCT in the last 72 hours. Sepsis Labs: Recent Labs  Lab 12/15/18 1736  LATICACIDVEN 1.2    Recent Results (from the past 240 hour(s))  SARS Coronavirus 2 (CEPHEID- Performed in [facility] hospital lab), Hosp Order     Status: None   Collection Time: 12/15/18  2:49 PM  Result Value Ref Range Status   SARS Coronavirus 2 NEGATIVE NEGATIVE Final    Comment: (NOTE) If result is NEGATIVE SARS-CoV-2 target nucleic acids are NOT DETECTED. The SARS-CoV-2 RNA is generally detectable in upper and lower  respiratory specimens during the acute phase of infection. The lowest  concentration of SARS-CoV-2 viral copies this assay can detect is 250  copies / mL. A negative result does not preclude SARS-CoV-2 infection  and should not be used as the sole basis for treatment or other  patient management decisions.  A negative result may occur with  improper specimen collection / handling, submission of specimen other  than nasopharyngeal swab, presence of viral mutation(s) within the  areas targeted by this assay, and inadequate number of viral copies  (<250 copies / mL). A negative result must be combined with clinical  observations, patient history, and epidemiological information. If result is POSITIVE SARS-CoV-2 target nucleic acids are DETECTED. The SARS-CoV-2 RNA is generally detectable in upper and lower  respiratory specimens dur ing the acute phase of infection.  Positive  results are indicative of active infection with SARS-CoV-2.  Clinical  correlation with patient history and other diagnostic information is  necessary to determine patient infection status.  Positive results do  not rule out bacterial infection or  co-infection with other viruses. If result is PRESUMPTIVE POSTIVE SARS-CoV-2 nucleic acids MAY BE PRESENT.   A presumptive positive result was obtained on the submitted specimen  and confirmed on repeat testing.  While 2019 novel coronavirus  (SARS-CoV-2) nucleic acids may be present in the submitted sample  additional confirmatory testing may be necessary for epidemiological  and / or clinical management purposes  to differentiate between  SARS-CoV-2 and other Sarbecovirus currently known to infect humans.  If clinically indicated additional testing with an alternate test  methodology [phone]) is advised. The SARS-CoV-2 RNA is generally  detectable in upper and lower respiratory sp ecimens during the acute  phase  of infection. The expected result is Negative. Fact Sheet for Patients:  StrictlyIdeas.no Fact Sheet for Healthcare Providers: BankingDealers.co.za This test is not yet approved or cleared by the [place] FDA and has been authorized for detection and/or diagnosis of SARS-CoV-2 by FDA under an Emergency Use Authorization (EUA).  This EUA will remain in effect (meaning this test can be used) for the duration of the COVID-19 declaration under Section 564(b)(1) of the Act, 21 U.S.C. section 360bbb-3(b)(1), unless the authorization is terminated or revoked sooner. Performed at [facility], [place] [address], [place], [place] [address]          Radiology Studies: Dg Chest Port 1 View  Result Date: 12/15/2018 CLINICAL DATA:  Shortness of breath EXAM: PORTABLE CHEST 1 VIEW COMPARISON:  Chest x-ray dated 07/12/2014 FINDINGS: The cardiac silhouette is enlarged. There is scattered hazy airspace opacities bilaterally. No pneumothorax. No large pleural effusion. There is no acute osseous abnormality. The lungs are somewhat hyperexpanded. IMPRESSION: 1. Cardiomegaly. 2. Hazy airspace opacities bilaterally may represent findings  of an atypical pneumonia versus developing pulmonary edema. 3. Mildly hyperexpanded lungs which can be seen in patients with COPD. Electronically Signed   By: Constance Holster M.D.   On: 12/15/2018 14:52        Scheduled Meds: . methylPREDNISolone sodium succinate      . aspirin  325 mg Oral Daily  . furosemide  40 mg Intravenous Q12H  . heparin  5,000 Units Subcutaneous Q8H  . HYDROcodone-acetaminophen  1 tablet Oral TID  . ipratropium-albuterol  3 mL Nebulization Q6H  . methylPREDNISolone (SOLU-MEDROL) injection  60 mg Intravenous Q12H  . multivitamin with minerals  1 tablet Oral Daily  . pantoprazole  40 mg Oral Daily  . sodium chloride flush  3 mL Intravenous Q12H   Continuous Infusions:   LOS: 1 day    Time spent: 35 minutes    Elmarie Shiley, MD [facility] Pager [phone]  If 7PM-7AM, please contact night-coverage [email or web address] Password [facility] 12/16/2018, 8:23 AM

## 2018-12-16 NOTE — Telephone Encounter (Signed)
Called and spoke with patient's wife; pt currently in hospital this week Scheduled appt with CY for 01/06/2019 at 11am in office for possible OSA Pt has previously seen Dr. Gwenette Greet and MW back 3 years ago Nothing further needed.

## 2018-12-16 NOTE — Progress Notes (Signed)
Pt transported to CT on BiPap and returned to [id number] without complications.

## 2018-12-16 NOTE — Progress Notes (Signed)
The patient has been seen in conjunction with Reino Bellis, NP. All aspects of care have been considered and discussed. The patient has been personally interviewed, examined, and all clinical data has been reviewed.   Acute on chronic diastolic heart failure secondary to severe fluid retention related to pulmonary status/cor pulmonale, hyperviscosity, and intrinsic diastolic dysfunction. Cautious diuresis as tolerated by blood pressure being careful not to induce hypotension given known severe pulmonary hypertension.  Ultimately, Aldactone may be a better diuretic than furosemide.  For now we will stick with furosemide.  Discontinue furosemide after today's doses and reassess in a.m.  Acute on chronic hypercapnic and hypoxic respiratory failure: Per treating team/pulmonology.  Acute on chronic cor pulmonale with severe pulmonary hypertension and RV failure: Management per treatment team and consider pulmonary consultation.  Prognosis is poor.  Essential hypertension: Poorly controlled.  Will likely improve with diuresis.  Troponin I elevation: Type II injury related to hypoxia and possible underlying coronary disease.  Do not plan to investigate.   Progress Note  Patient Name: Philip Powell Date of Encounter: 12/16/2018  Primary Cardiologist: Domenic Polite  Subjective   Currently on BiPAP.  Streamline.  PCO2 this a.m. was 94.  Inpatient Medications    Scheduled Meds: . aspirin  325 mg Oral Daily  . furosemide  40 mg Intravenous Q12H  . heparin  5,000 Units Subcutaneous Q8H  . ipratropium-albuterol  3 mL Nebulization Q6H  . methylPREDNISolone (SOLU-MEDROL) injection  60 mg Intravenous Q12H  . multivitamin with minerals  1 tablet Oral Daily  . pantoprazole  40 mg Oral Daily  . sodium chloride flush  3 mL Intravenous Q12H   Continuous Infusions: . azithromycin    . cefTRIAXone (ROCEPHIN)  IV     PRN Meds: acetaminophen **OR** acetaminophen, albuterol,  HYDROcodone-acetaminophen   Vital Signs    Vitals:   12/16/18 0642 12/16/18 0800 12/16/18 0818 12/16/18 0847  BP:  (!) 151/117  (!) 151/117  Pulse:  (!) 104  95  Resp:  20  16  Temp:  97.7 F (36.5 C)    TempSrc:  Oral    SpO2:  92% 93% 94%  Weight: (!) 143 kg     Height:        Intake/Output Summary (Last 24 hours) at 12/16/2018 0932 Last data filed at 12/16/2018 4034 Gross per 24 hour  Intake 723 ml  Output 2275 ml  Net -1552 ml   Last 3 Weights 12/16/2018 12/15/2018 05/20/2018  Weight (lbs) 315 lb 4.1 oz 318 lb 12.6 oz 311 lb 15.2 oz  Weight (kg) 143 kg 144.6 kg 141.5 kg      Telemetry    Sinus rhythm- Personally Reviewed  ECG    Not repeated- Personally Reviewed  Physical Exam  Obese, plethoric. GEN:  On BiPAP and asleep..   Neck:  Unable to assess. Cardiac: RRR  Respiratory: Clear to auscultation in anterior lung fields. GI: Soft, nontender, non-distended  MS: No edema; No deformity. Neuro:  Nonfocal  Psych: Normal affect   Labs    Chemistry Recent Labs  Lab 12/15/18 1404 12/15/18 1616 12/15/18 1814 12/16/18 0510  NA QUANTITY NOT SUFFICIENT, UNABLE TO PERFORM TEST 141 138 140  K QUANTITY NOT SUFFICIENT, UNABLE TO PERFORM TEST 4.7 4.4 4.7  CL QUANTITY NOT SUFFICIENT, UNABLE TO PERFORM TEST 97*  --  91*  CO2 QUANTITY NOT SUFFICIENT, UNABLE TO PERFORM TEST 34*  --  37*  GLUCOSE QUANTITY NOT SUFFICIENT, UNABLE TO PERFORM TEST 128*  --  147*  BUN QUANTITY NOT SUFFICIENT, UNABLE TO PERFORM TEST 11  --  14  CREATININE QUANTITY NOT SUFFICIENT, UNABLE TO PERFORM TEST 0.89  --  0.88  CALCIUM QUANTITY NOT SUFFICIENT, UNABLE TO PERFORM TEST 8.5*  --  8.5*  PROT QUANTITY NOT SUFFICIENT, UNABLE TO PERFORM TEST 6.7  --   --   ALBUMIN QUANTITY NOT SUFFICIENT, UNABLE TO PERFORM TEST 3.7  --   --   AST QUANTITY NOT SUFFICIENT, UNABLE TO PERFORM TEST 30  --   --   ALT QUANTITY NOT SUFFICIENT, UNABLE TO PERFORM TEST 26  --   --   ALKPHOS QUANTITY NOT SUFFICIENT,  UNABLE TO PERFORM TEST 47  --   --   BILITOT QUANTITY NOT SUFFICIENT, UNABLE TO PERFORM TEST 1.5*  --   --   GFRNONAA QUANTITY NOT SUFFICIENT, UNABLE TO PERFORM TEST >60  --  >60  GFRAA QUANTITY NOT SUFFICIENT, UNABLE TO PERFORM TEST >60  --  >60  ANIONGAP QUANTITY NOT SUFFICIENT, UNABLE TO PERFORM TEST 10  --  12     Hematology Recent Labs  Lab 12/15/18 1404 12/15/18 1814 12/16/18 0510  WBC 13.3*  --  14.2*  RBC 7.50*  --  7.57*  HGB 18.2* 21.4* 18.3*  HCT 63.7* 63.0* 63.8*  MCV 84.9  --  84.3  MCH 24.3*  --  24.2*  MCHC 28.6*  --  28.7*  RDW 20.8*  --  20.9*  PLT 139*  --  131*    Cardiac Enzymes Recent Labs  Lab 12/15/18 1404 12/15/18 1616 12/15/18 2115 12/16/18 0510  TROPONINI QUANTITY NOT SUFFICIENT, UNABLE TO PERFORM TEST 0.04* 0.03* <0.03   No results for input(s): TROPIPOC in the last 168 hours.   BNP Recent Labs  Lab 12/15/18 1404  BNP 430.7*     DDimer No results for input(s): DDIMER in the last 168 hours.   Radiology    Dg Chest Port 1 View  Result Date: 12/15/2018 CLINICAL DATA:  Shortness of breath EXAM: PORTABLE CHEST 1 VIEW COMPARISON:  Chest x-ray dated 07/12/2014 FINDINGS: The cardiac silhouette is enlarged. There is scattered hazy airspace opacities bilaterally. No pneumothorax. No large pleural effusion. There is no acute osseous abnormality. The lungs are somewhat hyperexpanded. IMPRESSION: 1. Cardiomegaly. 2. Hazy airspace opacities bilaterally may represent findings of an atypical pneumonia versus developing pulmonary edema. 3. Mildly hyperexpanded lungs which can be seen in patients with COPD. Electronically Signed   By: Constance Holster M.D.   On: 12/15/2018 14:52    Cardiac Studies   TTE: 12/15/18  IMPRESSIONS    1. The left ventricle has normal systolic function with an ejection fraction of 60-65%. The cavity size was normal. There is mildly increased left ventricular wall thickness. Left ventricular diastolic Doppler parameters  are consistent with impaired  relaxation. There is right ventricular volume and pressure overload and respiratory related leftward septal shift, likely secondary to pulmonary disease, cannot exclude pericardial constriction.  2. The right ventricle has preserved function at the base and mid ventricle, with moderately reduced function at the RV apex. The cavity was moderately enlarged. There is no increase in right ventricular wall thickness. Right ventricular systolic  pressure is severely elevated with an estimated pressure of 71.7 mmHg.  3. Sclerosis without any evidence of stenosis of the aortic valve.  4. Right atrial size was mildly dilated.  Patient Profile     68 y.o. male with a hx of hypoxic resp failure, COPD, OSA declined CPAP, cor  pulmonale, D-CHF, polycythemia, obesity, who was seen for the evaluation of resp failure.  Assessment & Plan    1. Acute on Chronic HF: noted to have hx of RV dysfunction on previous echos. BNP 430. Echo above with normal EF Diuresing with IV lasix. UOP 2.2L yesterday. Weight is trending down. Renal function and BP stable.  -- continue diuresis  2. Pulmonary HTN suspect 2/2 to underlying COPD: Echo yesterday noted above with severely elevated RV pressure of 26mmHg. Right atrium mildly dilated at 89mmHg. Moderately enlarged RV. LV is preserved. Consider further work up with [place].  3. OSA: reported to have using Bipap but has not seen pulmonologist in > 2 years. Would benefit from pulmonary consult.  4. Obesity: would benefit from aggressive lifestyle modification.    For questions or updates, please contact [place] Please consult [email or web address] for contact info under        Signed, Reino Bellis, NP  12/16/2018, 9:32 AM

## 2018-12-16 NOTE — Progress Notes (Signed)
Patient Lethargic and confused upon entering room for morning assessment. Patient was wearing home Cpap mask and also on nasal cannula. Patients Sats noted 94%. Patient was unable to follow commands or answer orientation questions. Patient was audibly wheezing. Called Respiratory, Rapid Response and MD. MD at bedside to assess patient, stat ABG collected and was subsequently placed on Bipap. Patient was also taken down for a CT of his head. Patient is currently back in his room and looking much improved. Alert x4, with VS stable. Called and updated wife on status. Will continue to monitor.

## 2018-12-16 NOTE — Telephone Encounter (Signed)
-----   Message from Garner Nash, DO sent at 12/16/2018  4:52 PM EDT ----- Regarding: hospital follow up Dear Triage,  He needs to be setup with one of the sleep physicians. I suspect he will need further cpap or BIPAP management at home.   Next available would be appropriate. He is hospitalized and I suspect will be discharge by the end of the week.   Thanks  Garner Nash, DO [facility] 12/16/2018 4:56 PM

## 2018-12-16 NOTE — Consult Note (Signed)
NAME:  Philip Powell, MRN:  [id number], DOB:  Aug 24, 1950, LOS: 1 ADMISSION DATE:  12/15/2018, CONSULTATION DATE:  12/16/18 REFERRING MD:  Tyrell Antonio  CHIEF COMPLAINT:  AMS   Brief History   Philip Powell is a 68 y.o. male who was admitted 5/19 with AoC hypoxic and hypercapnic respiratory failure.  Had worsening mental status due to hypercapnia AM 5/20; therefore, placed on BiPAP and PCCM called.  History of present illness   Philip Powell is a 68 y.o. male who has a PMH including but not limited to chronic hypoxic and hypercapnic respiratory failure, COPD, tobacco dependence, OSA / OHS (on nocturnal CPAP), polycythemia requiring phlebotomy, cor pulmonale,  (see "past medical history" for rest).  He presented to [facility] ED 5/20 with dyspnea and hypoxia.  Symptoms started 2 days prior and did not improve.  He attempted to take a few doses of lasix (has this on a PRN basis); however, had no response.  He was found to have acute on likely chronic hypoxia with AoC hypercapnia along with pulmonary edema.  He was admitted by [facility] and cardiology was called in consultation who recommended STAT echo and IV diuresis as tolerated.  AM 5/20, he was found to be altered and minimally responsive.  ABG was obtained and demonstrated AoC hypoxic and hypercapnic respiratory failure (7.26 / 94 / 76).  He was placed on BiPAP 15/5 and PCCM was called in consultation. At the time of our evaluation, he remains on BiPAP and is now fully awake and able to answer all questions appropriately as well as participate in exam.  He states he had been wearing his home CPAP throughout the night.  He denies any recent fevers/chills/sweats, cough, wheeze, N/V/D, abd pain, myalgias, exposures to known sick contacts, recent travel.  Did have some chest tightness on admission but since resolved.  He apparently used to be on oxygen; however, is no longer on it (unsure if he was ever told to stop).  He uses CPAP nocturnally.  He used  to see Dr. Melvyn Novas before switching to the [facility]; however, has not followed up with pulmonologist at [facility] in years.  He is willing to return there upon discharge.  PFT's from 2015: FVC 50% pred, FEV1 48% pred, ratio 96% pred. He continues to smoke at least 1ppd cigarettes.  Past Medical History  Chronic hypoxic and hypercapnic respiratory failure, COPD, tobacco dependence, OSA / OHS (on nocturnal CPAP), polycythemia requiring phlebotomy, cor pulmonale.  Significant Hospital Events   5/19 > admit. 5/20 > PCCM consult.  Consults:  PCCM. Cardiology.  Procedures:  None.  Significant Diagnostic Tests:  Echo 5/19 > EF 60-65%, impaired relaxation, RV overload likely due to pulmonary disease, RV pressure 71.  Micro Data:  Blood 5/19 >  SARS CoV2 5/19 > neg.  Antimicrobials:  Ceftriaxone 5/19 >  Azithromycin 5/19 >    Interim history/subjective:  On BiPAP at 15/5, tolerating well.  Now awake answering all questions appropriately and following all commands.  Objective:  Blood pressure (!) 151/117, pulse 95, temperature 97.7 F (36.5 C), temperature source Oral, resp. rate 16, height 6' (1.829 m), weight (!) 143 kg, SpO2 94 %.    FiO2 (%):  [40 %] 40 %   Intake/Output Summary (Last 24 hours) at 12/16/2018 1002 Last data filed at 12/16/2018 0614 Gross per 24 hour  Intake 723 ml  Output 2275 ml  Net -1552 ml   Filed Weights   12/15/18 2133 12/16/18 0642  Weight: (!) 144.6  kg (!) 143 kg    Examination: General: Obese male, in NAD. Neuro: A&O x 3, no deficits. HEENT: [place]/AT. Sclerae anicteric.  BiPAP in place. Cardiovascular: RRR, no M/R/G.  Lungs: Respirations even and unlabored.  Diminished air movement bilaterally with very faint end expiratory wheeze. Abdomen: Obese, BS x 4, soft, NT/ND.  Musculoskeletal: Venous stasis changes noted.  No gross deformities, 1+ edema.  Skin: Intact, warm, no rashes.  Assessment & Plan:   AoC hypoxic and hypercapnic respiratory failure -  multifactorial in setting COPD with possible exacerbation, cor pulmonale, OSA / OHS, PAH, possible CAP (though no hx to support). - Continue BiPAP for now at least 4 hours and mandatory at night (needs switch from nocturnal CPAP to BiPAP upon discharge). - No need for intubation. - Would not obtain serial ABG's and target normocapnia as pt is chronically hypercapnic.  Rather, use mental status as gauge. - Continue BD's, steroids (dropped to 40mg  daily as very mild bronchospasm. - Continue abx for now. - Assess PCT and consider early d/c abx if low. - Continue diuresis as BP / SCr permit. - Mobilize. - Tobacco cessation counseling. - Ensure pulmonary follow up at [facility] upon discharge and would recommend repeat PFT's to assess for progression from 2015.  AoC dCHF with cor pulmonale. - Cardiology following and considering RHC at some point. - Continue diuresis.  Rest per primary team.  Best Practice:  Diet: NPO for now, can likely advance to heart healthy after finishes BiPAP this AM. Pain/Anxiety/Delirium protocol (if indicated): N/A. VAP protocol (if indicated): N/A. DVT prophylaxis: SCD's / Heparin. GI prophylaxis: N/A. Glucose control: N/A. Mobility: Mobilize with assist. Code Status: Full. Family Communication: Wife updated by primary team. Disposition: Progressive.  Labs   CBC: Recent Labs  Lab 12/15/18 1404 12/15/18 1814 12/16/18 0510 12/16/18 0933  WBC 13.3*  --  14.2* 12.7*  NEUTROABS 9.6*  --   --   --   HGB 18.2* 21.4* 18.3* 18.1*  HCT 63.7* 63.0* 63.8* 62.5*  MCV 84.9  --  84.3 84.0  PLT 139*  --  131* 563*   Basic Metabolic Panel: Recent Labs  Lab 12/15/18 1404 12/15/18 1616 12/15/18 1736 12/15/18 1814 12/16/18 0510  NA QUANTITY NOT SUFFICIENT, UNABLE TO PERFORM TEST 141  --  138 140  K QUANTITY NOT SUFFICIENT, UNABLE TO PERFORM TEST 4.7  --  4.4 4.7  CL QUANTITY NOT SUFFICIENT, UNABLE TO PERFORM TEST 97*  --   --  91*  CO2 QUANTITY NOT SUFFICIENT,  UNABLE TO PERFORM TEST 34*  --   --  37*  GLUCOSE QUANTITY NOT SUFFICIENT, UNABLE TO PERFORM TEST 128*  --   --  147*  BUN QUANTITY NOT SUFFICIENT, UNABLE TO PERFORM TEST 11  --   --  14  CREATININE QUANTITY NOT SUFFICIENT, UNABLE TO PERFORM TEST 0.89  --   --  0.88  CALCIUM QUANTITY NOT SUFFICIENT, UNABLE TO PERFORM TEST 8.5*  --   --  8.5*  MG  --   --  2.3  --  2.3  PHOS  --   --  3.6  --   --    GFR: Estimated Creatinine Clearance: 122.9 mL/min (by C-G formula based on SCr of 0.88 mg/dL). Recent Labs  Lab 12/15/18 1404 12/15/18 1736 12/16/18 0510 12/16/18 0933  WBC 13.3*  --  14.2* 12.7*  LATICACIDVEN  --  1.2  --   --    Liver Function Tests: Recent Labs  Lab 12/15/18 1404  12/15/18 1616  AST QUANTITY NOT SUFFICIENT, UNABLE TO PERFORM TEST 30  ALT QUANTITY NOT SUFFICIENT, UNABLE TO PERFORM TEST 26  ALKPHOS QUANTITY NOT SUFFICIENT, UNABLE TO PERFORM TEST 47  BILITOT QUANTITY NOT SUFFICIENT, UNABLE TO PERFORM TEST 1.5*  PROT QUANTITY NOT SUFFICIENT, UNABLE TO PERFORM TEST 6.7  ALBUMIN QUANTITY NOT SUFFICIENT, UNABLE TO PERFORM TEST 3.7   No results for input(s): LIPASE, AMYLASE in the last 168 hours. No results for input(s): AMMONIA in the last 168 hours. ABG    Component Value Date/Time   PHART 7.264 (L) 12/16/2018 0825   PCO2ART 94.0 (HH) 12/16/2018 0825   PO2ART 76.4 (L) 12/16/2018 0825   HCO3 41.2 (H) 12/16/2018 0825   TCO2 37 (H) 12/15/2018 1814   O2SAT 93.4 12/16/2018 0825    Coagulation Profile: No results for input(s): INR, PROTIME in the last 168 hours. Cardiac Enzymes: Recent Labs  Lab 12/15/18 1404 12/15/18 1616 12/15/18 2115 12/16/18 0510  TROPONINI QUANTITY NOT SUFFICIENT, UNABLE TO PERFORM TEST 0.04* 0.03* <0.03   HbA1C: No results found for: HGBA1C CBG: Recent Labs  Lab 12/16/18 0814  GLUCAP 156*    Review of Systems:   All negative; except for those that are bolded, which indicate positives.  Constitutional: weight loss, weight gain,  night sweats, fevers, chills, fatigue, weakness.  HEENT: headaches, sore throat, sneezing, nasal congestion, post nasal drip, difficulty swallowing, tooth/dental problems, visual complaints, visual changes, ear aches. Neuro: difficulty with speech, weakness, numbness, ataxia. CV:  chest tightness (improved), orthopnea, PND, swelling in lower extremities, dizziness, palpitations, syncope.  Resp: cough, hemoptysis, dyspnea (improved), wheezing. GI: heartburn, indigestion, abdominal pain, nausea, vomiting, diarrhea, constipation, change in bowel habits, loss of appetite, hematemesis, melena, hematochezia.  GU: dysuria, change in color of urine, urgency or frequency, flank pain, hematuria. MSK: joint pain or swelling, decreased range of motion. Psych: change in mood or affect, depression, anxiety, suicidal ideations, homicidal ideations. Skin: rash, itching, bruising.  Past medical history  He,  has a past medical history of COPD (chronic obstructive pulmonary disease) ([place]), Cor pulmonale, chronic ([place]), Essential hypertension, History of pneumonia, Obesity, Obstructive sleep apnea, and Polycythemia.   Surgical History    Past Surgical History:  Procedure Laterality Date  . CARPAL TUNNEL RELEASE  2008     Social History   reports that he has been smoking cigarettes. He has a 43.00 pack-year smoking history. He has never used smokeless tobacco. He reports current alcohol use. He reports that he does not use drugs.   Family history   His family history includes Cancer in his brother, father, and mother; Heart disease in his maternal grandfather.   Allergies No Known Allergies   Home meds  Prior to Admission medications   Medication Sig Start Date End Date Taking? Authorizing Provider  Ascorbic Acid (VITAMIN C PO) Take 1 tablet by mouth daily.   Yes [provider]  aspirin 325 MG tablet Take 325 mg by mouth daily.   Yes [provider]  furosemide (LASIX) 40 MG tablet  Take 1 tablet (40 mg total) by mouth daily. Patient taking differently: Take 60 mg by mouth daily as needed for fluid.  06/16/14  Yes Debbe Odea, MD  HYDROcodone-acetaminophen (NORCO) 7.5-325 MG per tablet Take 1 tablet by mouth 3 (three) times daily.  06/03/14  Yes [provider]  ibuprofen (ADVIL) 200 MG tablet Take 400 mg by mouth every 6 (six) hours as needed for mild pain.   Yes [provider]  Multiple Vitamins-Minerals (  CENTRUM ADULTS) TABS Take 1 tablet by mouth daily.   Yes [provider]  omeprazole (PRILOSEC) 20 MG capsule Take 20 mg by mouth daily as needed (acid reflux).   Yes [provider]  testosterone cypionate (DEPOTESTOTERONE CYPIONATE) 100 MG/ML injection Inject 100 mg into the muscle every 14 (fourteen) days. For IM use only    Yes [provider]    Montey Hora, [facility] Pager: [phone].  If no answer, [phone] 12/16/2018, 10:02 AM

## 2018-12-17 LAB — PROCALCITONIN: Procalcitonin: <0.1 ng/mL

## 2018-12-17 LAB — CBC
HCT: 58.5 % — ABNORMAL HIGH (ref 39.0–52.0)
Hemoglobin: 17.1 g/dL — ABNORMAL HIGH (ref 13.0–17.0)
MCH: 24.4 pg — ABNORMAL LOW (ref 26.0–34.0)
MCHC: 29.2 g/dL — ABNORMAL LOW (ref 30.0–36.0)
MCV: 83.6 fL (ref 80.0–100.0)
Platelets: 126 10*3/uL — ABNORMAL LOW (ref 150–400)
RBC: 7 MIL/uL — ABNORMAL HIGH (ref 4.22–5.81)
RDW: 20.8 % — ABNORMAL HIGH (ref 11.5–15.5)
WBC: 12.6 10*3/uL — ABNORMAL HIGH (ref 4.0–10.5)
nRBC: 0.9 % — ABNORMAL HIGH (ref 0.0–0.2)

## 2018-12-17 LAB — BASIC METABOLIC PANEL
Anion gap: 12 (ref 5–15)
BUN: 20 mg/dL (ref 8–23)
CO2: 36 mmol/L — ABNORMAL HIGH (ref 22–32)
Calcium: 8 mg/dL — ABNORMAL LOW (ref 8.9–10.3)
Chloride: 90 mmol/L — ABNORMAL LOW (ref 98–111)
Creatinine, Ser: 0.94 mg/dL (ref 0.61–1.24)
GFR calc Af Amer: >60 mL/min (ref >60)
GFR calc non Af Amer: >60 mL/min (ref >60)
Glucose, Bld: 183 mg/dL — ABNORMAL HIGH (ref 70–99)
Potassium: 4 mmol/L (ref 3.5–5.1)
Sodium: 138 mmol/L (ref 135–145)

## 2018-12-17 MED ORDER — FUROSEMIDE 10 MG/ML IJ SOLN
40.0000 mg | Freq: Two times a day (BID) | INTRAMUSCULAR | Status: AC
  Administered 2018-12-17 (×2): 40 mg via INTRAVENOUS
  Filled 2018-12-17 (×2): qty 4

## 2018-12-17 MED ORDER — IPRATROPIUM-ALBUTEROL 0.5-2.5 (3) MG/3ML IN SOLN
3.0000 mL | Freq: Four times a day (QID) | RESPIRATORY_TRACT | Status: DC
  Administered 2018-12-17 – 2018-12-18 (×5): 3 mL via RESPIRATORY_TRACT
  Filled 2018-12-17 (×4): qty 3

## 2018-12-17 NOTE — Progress Notes (Signed)
Progress Note  Patient Name: Philip Powell Date of Encounter: 12/17/2018  Primary Cardiologist: No primary care provider on file.   Subjective   Breathing better. Asleep. Did not awaken.  Inpatient Medications    Scheduled Meds: . aspirin  325 mg Oral Daily  . furosemide  40 mg Intravenous BID  . heparin  5,000 Units Subcutaneous Q8H  . ipratropium-albuterol  3 mL Nebulization TID  . methylPREDNISolone (SOLU-MEDROL) injection  40 mg Intravenous Daily  . multivitamin with minerals  1 tablet Oral Daily  . pantoprazole  40 mg Oral Daily  . sodium chloride flush  3 mL Intravenous Q12H   Continuous Infusions: . azithromycin 500 mg (12/16/18 1157)  . cefTRIAXone (ROCEPHIN)  IV 1 g (12/16/18 1039)   PRN Meds: acetaminophen **OR** acetaminophen, albuterol, HYDROcodone-acetaminophen   Vital Signs    Vitals:   12/17/18 0330 12/17/18 0335 12/17/18 0340 12/17/18 0345  BP:   (!) 122/99   Pulse: 80 88 90 86  Resp: 16 (!) 22 (!) 21 20  Temp:   97.9 F (36.6 C)   TempSrc:   Oral   SpO2: (!) 86% 90% (!) 89% (!) 87%  Weight:      Height:        Intake/Output Summary (Last 24 hours) at 12/17/2018 0806 Last data filed at 12/17/2018 0658 Gross per 24 hour  Intake 1050 ml  Output 2050 ml  Net -1000 ml   Last 3 Weights 12/16/2018 12/15/2018 05/20/2018  Weight (lbs) 315 lb 4.1 oz 318 lb 12.6 oz 311 lb 15.2 oz  Weight (kg) 143 kg 144.6 kg 141.5 kg      Telemetry    NSR - Personally Reviewed  ECG    No new - Personally Reviewed  Physical Exam  Obese and plethoric GEN: No acute distress.   Neck: No JVD Cardiac: RRR, no murmurs, rubs, or gallops.  Respiratory: Clear to auscultation bilaterally. GI: Soft, nontender, non-distended  MS: No edema; No deformity. Neuro:  Nonfocal  Psych: Normal affect   Labs    Chemistry Recent Labs  Lab 12/15/18 1404 12/15/18 1616  12/16/18 0510 12/16/18 0933 12/17/18 0727  NA QUANTITY NOT SUFFICIENT, UNABLE TO PERFORM TEST  141   < > 140 140 138  K QUANTITY NOT SUFFICIENT, UNABLE TO PERFORM TEST 4.7   < > 4.7 4.7 4.0  CL QUANTITY NOT SUFFICIENT, UNABLE TO PERFORM TEST 97*  --  91* 95* 90*  CO2 QUANTITY NOT SUFFICIENT, UNABLE TO PERFORM TEST 34*  --  37* 34* 36*  GLUCOSE QUANTITY NOT SUFFICIENT, UNABLE TO PERFORM TEST 128*  --  147* 174* 183*  BUN QUANTITY NOT SUFFICIENT, UNABLE TO PERFORM TEST 11  --  14 15 20   CREATININE QUANTITY NOT SUFFICIENT, UNABLE TO PERFORM TEST 0.89  --  0.88 0.80 0.94  CALCIUM QUANTITY NOT SUFFICIENT, UNABLE TO PERFORM TEST 8.5*  --  8.5* 8.2* 8.0*  PROT QUANTITY NOT SUFFICIENT, UNABLE TO PERFORM TEST 6.7  --   --   --   --   ALBUMIN QUANTITY NOT SUFFICIENT, UNABLE TO PERFORM TEST 3.7  --   --   --   --   AST QUANTITY NOT SUFFICIENT, UNABLE TO PERFORM TEST 30  --   --   --   --   ALT QUANTITY NOT SUFFICIENT, UNABLE TO PERFORM TEST 26  --   --   --   --   ALKPHOS QUANTITY NOT SUFFICIENT, UNABLE TO PERFORM TEST 47  --   --   --   --  BILITOT QUANTITY NOT SUFFICIENT, UNABLE TO PERFORM TEST 1.5*  --   --   --   --   GFRNONAA QUANTITY NOT SUFFICIENT, UNABLE TO PERFORM TEST >60  --  >60 >60 >60  GFRAA QUANTITY NOT SUFFICIENT, UNABLE TO PERFORM TEST >60  --  >60 >60 >60  ANIONGAP QUANTITY NOT SUFFICIENT, UNABLE TO PERFORM TEST 10  --  12 11 12    < > = values in this interval not displayed.     Hematology Recent Labs  Lab 12/15/18 1404 12/15/18 1814 12/16/18 0510 12/16/18 0933  WBC 13.3*  --  14.2* 12.7*  RBC 7.50*  --  7.57* 7.44*  HGB 18.2* 21.4* 18.3* 18.1*  HCT 63.7* 63.0* 63.8* 62.5*  MCV 84.9  --  84.3 84.0  MCH 24.3*  --  24.2* 24.3*  MCHC 28.6*  --  28.7* 29.0*  RDW 20.8*  --  20.9* 20.6*  PLT 139*  --  131* 125*    Cardiac Enzymes Recent Labs  Lab 12/16/18 0510 12/16/18 0933 12/16/18 1245 12/16/18 2052  TROPONINI <0.03 <0.03 <0.03 <0.03   No results for input(s): TROPIPOC in the last 168 hours.   BNP Recent Labs  Lab 12/15/18 1404  BNP 430.7*     DDimer  No results for input(s): DDIMER in the last 168 hours.   Radiology    Ct Head Wo Contrast  Result Date: 12/16/2018 CLINICAL DATA:  Encephalopathy EXAM: CT HEAD WITHOUT CONTRAST TECHNIQUE: Contiguous axial images were obtained from the base of the skull through the vertex without intravenous contrast. COMPARISON:  None. FINDINGS: Brain: No evidence of acute infarction, hemorrhage, hydrocephalus, extra-axial collection or mass lesion/mass effect. Vascular: Blood pool density is higher than normal in the arteries and veins compatible with polycythemia. No asymmetry. Skull: Negative Sinuses/Orbits: Paranasal sinuses clear. No orbital mass. Bilateral cataract surgery Other: None IMPRESSION: No acute abnormality. Electronically Signed   By: Franchot Gallo M.D.   On: 12/16/2018 09:46   Dg Chest Port 1 View  Result Date: 12/16/2018 CLINICAL DATA:  Shortness of breath. EXAM: PORTABLE CHEST 1 VIEW COMPARISON:  One-view chest x-ray 12/15/2018 FINDINGS: Heart is mildly enlarged. Pulmonary vascular congestion has improved. No significant airspace consolidation is present. Visualized soft tissues and bony thorax are unremarkable. IMPRESSION: 1. Cardiomegaly with improving pulmonary vascular congestion. 2. No significant airspace consolidation. Electronically Signed   By: San Morelle M.D.   On: 12/16/2018 13:07   Dg Chest Port 1 View  Result Date: 12/15/2018 CLINICAL DATA:  Shortness of breath EXAM: PORTABLE CHEST 1 VIEW COMPARISON:  Chest x-ray dated 07/12/2014 FINDINGS: The cardiac silhouette is enlarged. There is scattered hazy airspace opacities bilaterally. No pneumothorax. No large pleural effusion. There is no acute osseous abnormality. The lungs are somewhat hyperexpanded. IMPRESSION: 1. Cardiomegaly. 2. Hazy airspace opacities bilaterally may represent findings of an atypical pneumonia versus developing pulmonary edema. 3. Mildly hyperexpanded lungs which can be seen in patients with COPD.  Electronically Signed   By: Constance Holster M.D.   On: 12/15/2018 14:52    Cardiac Studies   No new data  Patient Profile     68 y.o. male  with a hx of hypoxic resp failure, COPD, OSA declined CPAP, cor pulmonale, D-CHF, polycythemia, obesity,who was seen for the evaluation of resp failure.  Assessment & Plan    1. A/C diastolic heart failure with evidence volume overload. Continue cautious diuresis with lasix until kidney bump or tendency toward lower BP. Care to avoid shock in setting  of severe pulmonary hypertension. Reordered two doses for today IV.Ultimately aldactone. 2. A/C hypercarbic hypoxic respiratory failure- per team      For questions or updates, please contact [place] Please consult [email or web address] for contact info under        Signed, Sinclair Grooms, MD  12/17/2018, 8:06 AM

## 2018-12-17 NOTE — Evaluation (Signed)
Physical Therapy Evaluation & Discharge Patient Details Name: Philip Powell MRN: [id number] DOB: [birth year] Today's Date: 12/17/2018   History of Present Illness  Pt is a 68 y.o. male admitted 12/15/18 with worsening SOB, DOE and BLE edema; likely secondary to COPD and HF exacerbation, cor pulmonale. 5/20 pt with more lethargy/AMS, improved with BiPAP. PMH includes cor pulmonale, HF, OSA, tobacco use.    Clinical Impression  Patient evaluated by Physical Therapy with no further acute PT needs identified. 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 Today, pt indep with mobility. Educ re: activity recommendations with HF/COPD, energy conservation strategies, potential need for home O2. Pt with good awareness of self-monitoring activity and uses pulse ox at home. All education has been completed and the patient has no further questions. Encouraged to continue ambulating during hospital admission. PT is signing off. Thank you for this referral.  At rest, SpO2 88-92% on 4L O2 [place]; down to 83% on RA While ambulating, SpO2 86% on 4L O2 [place]     Follow Up Recommendations No PT follow up    Equipment Recommendations  None recommended by PT(potentially home O2)    Recommendations for Other Services       Precautions / Restrictions Precautions Precautions: Other (comment) Precaution Comments: SpO2 86% on 4L O2 [place] with ambulation Restrictions Weight Bearing Restrictions: No      Mobility  Bed Mobility Overal bed mobility: Independent                Transfers Overall transfer level: Independent                  Ambulation/Gait Ambulation/Gait assistance: Independent Gait Distance (Feet): 350 Feet Assistive device: None Gait Pattern/deviations: Step-through pattern;Decreased stride length Gait velocity: Decreased Gait velocity interpretation: 1.31 - 2.62 ft/sec, indicative of limited community ambulator General Gait Details: Indep with ambulation;  SpO2 down to 86% on 4L O2 [place]. Pt states, "It doesn't matter if you keep turning the oxygen up, my sats won't come up for another day or two"  Stairs            Wheelchair Mobility    Modified Rankin (Stroke Patients Only)       Balance Overall balance assessment: No apparent balance deficits (not formally assessed)                                           Pertinent Vitals/Pain Pain Assessment: No/denies pain    Home Living Family/patient expects to be discharged to:: Private residence Living Arrangements: Spouse/significant other Available Help at Discharge: Family;Available 24 hours/day Type of Home: House Home Access: Stairs to enter Entrance Stairs-Rails: Psychiatric nurse of Steps: 4 Home Layout: One level Home Equipment: Clinical cytogeneticist - 2 wheels;Walker - 4 wheels;Bedside commode;Wheelchair - manual;Cane - single point Additional Comments: DME from wife having previous TKAs and son in [facility]    Prior Function Level of Independence: Independent         Comments: Indep with mobility; 4 steps into home cause DOE, but pt able to manage and recover. Has pulse ox to monitor O2. 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     [employer]        Extremity/Trunk Assessment   Upper Extremity Assessment Upper Extremity Assessment: Overall WFL for tasks assessed    Lower Extremity Assessment Lower Extremity Assessment: Overall  WFL for tasks assessed    Cervical / Trunk Assessment Cervical / Trunk Assessment: Normal  Communication   Communication: No difficulties  Cognition Arousal/Alertness: Awake/alert Behavior During Therapy: WFL for tasks assessed/performed Overall Cognitive Status: Within Functional Limits for tasks assessed                                        General Comments General comments (skin integrity, edema, etc.): When asked about incentive spirometer, pt declined, "I wont use it so don't  waste one. I don't believe in those things."     Exercises     Assessment/Plan    PT Assessment Patent does not need any further PT services  PT Problem List         PT Treatment Interventions      PT Goals (Current goals can be found in the Care Plan section)  Acute Rehab PT Goals PT Goal Formulation: All assessment and education complete, DC therapy    Frequency     Barriers to discharge        Co-evaluation               AM-PAC PT "6 Clicks" Mobility  Outcome Measure Help needed turning from your back to your side while in a flat bed without using bedrails?: None Help needed moving from lying on your back to sitting on the side of a flat bed without using bedrails?: None Help needed moving to and from a bed to a chair (including a wheelchair)?: None Help needed standing up from a chair using your arms (e.g., wheelchair or bedside chair)?: None Help needed to walk in hospital room?: None Help needed climbing 3-5 steps with a railing? : A Little 6 Click Score: 23    End of Session Equipment Utilized During Treatment: Oxygen Activity Tolerance: Patient tolerated treatment well Patient left: in bed;with call bell/phone within reach Nurse Communication: Mobility status PT Visit Diagnosis: Other abnormalities of gait and mobility (R26.89)    Time: 4239-5320 PT Time Calculation (min) (ACUTE ONLY): 25 min   Charges:   PT Evaluation $PT Eval Moderate Complexity: 1 Mod PT Treatments $Self Care/Home Management: 8-22      Mabeline Caras, PT, DPT Acute Rehabilitation Services  Pager [phone] Office Uncertain 12/17/2018, 4:25 PM

## 2018-12-17 NOTE — Progress Notes (Signed)
PROGRESS NOTE    Philip Powell  GUR:[id number] DOB: June 05, 1951 DOA: 12/15/2018 PCP: Octavio Graves, DO    Brief Narrative: 68 year old male with past medical history significant for cor pulmonale, chronic diastolic heart failure last ejection fraction 65% on TTE 2015, OSA, polycythemia, tobacco use who presents to the ED complaining of shortness of breath, dyspnea on exertion and chest pressure and worsening lower extremity edema that is started 2 days prior to admission.  Patient also report oxygen level at home 70%.  Patient uses Lasix at home on as-needed basis, he has been using it for the last 2 days without any improvement. Evaluation in the ED patient oxygen saturation 95% on 5 L of oxygen, hemoglobin 18 white blood cell 13 BUN 11 creatinine 0.8, BNP 430.  ABG pH 7.3 PCO2 66 PO2 76.  A chest x-ray show bilateral hazy opacities suggestive of pulmonary edema.  EKG showed sinus tachycardia without acute ischemic changes. Patient was given IV Solu-Medrol and IV Lasix in the ED.  Cardiology was consulted who recommended stat echo and hospitalist for admission for IV diuresis.  On 5/20 patient was noted to be more lethargic, incoherent, repeating himself over and over.  Repeated blood gas showed PCO2 94 and a pH at 7.2.  Patient was placed on BiPAP, with improvement of mental status. Pulmonary has been consulted as well for further evaluation and treatment of cor pulmonale.  Pulmonology is agreeable with IV steroid and subsequently short taper prednisone, BiPAP and scheduled nebulizer treatment.  They have arranged follow-up as an outpatient for sleep study and titration of CPAP/ BiPAP.   Assessment & Plan:   Principal Problem:   Acute on chronic respiratory failure with hypoxia and hypercapnia (HCC) Active Problems:   OSA (obstructive sleep apnea)   Polycythemia   Essential hypertension   Cor pulmonale (HCC)   Acute diastolic heart failure (HCC)  1-Acute hypoxic, hypercarbic  respiratory failure: Multifactorial secondary to COPD exacerbation and heart failure exacerbation.  Cor pulmonale. Continue with IV Lasix 40 mg IV twice daily. Urine out put 2 L.  Continue with IV Solu-Medrol 40 mg daily. Patient with bilateral diffuse wheezing.  Patient developed worsening hypercapnic respiratory failure the morning of 5/20: Place patient on BiPAP  due to worsening hypercapnia and altered mental status. Continue with scheduled nebulizer treatments. Mental status improved on BIPAP. Continue BIPAP if patient  nap and HS.  Discussed with Dr Valeta Harms, patient can resume CPAP at discharge. They have arrange follow up with pulmonologist on 6-10 at 11 am.   2-Acute metabolic encephalopathy: Patient lethargic the morning of 5/20, PCO2 at 94. CT head negative. Patient mental status improved on BiPAP. He is alert and conversant.   3-exertional chest pain: Repeat EKG this morning.  Continue to cycle enzymes.  Cardiology following. Polycythemia: Chronic and likely secondary to tobacco use and chronic pulmonary disease OHS. Undergoes therapeutic phlebotomy by his PCP Continue with aspirin.  OSA/OHS: Continue with BiPAP  Chronic osteoarthritis: We will discontinue scheduled Norco.  Will order PRN to avoid withdrawal.  PNA;  Chest x-ray with questionable infiltrate, present with leukocytosis. Due to acute decompensation he was started on IV ceftriaxone and azithromycin to cover for pneumonia. WBC trending down.   Polycythemia;  Continue with aspirin.  Counseling regarding smoking cessation. Hb decrease to 17 today.     Estimated body mass index is 42.76 kg/m as calculated from the following:   Height as of this encounter: 6' (1.829 m).   Weight as of this encounter:  143 kg.   DVT prophylaxis: Heparin  code Status: Full code Family Communication: I have updated wife 5-20 Disposition Plan:  Remain in the hospital for management of respiratory failure, he is still requiring  IV lasix and IV solumedrol.    Consultants:   Cardiology  Pulmonology   Procedures:  Echo;  The left ventricle has normal systolic function with an ejection fraction of 60-65%. The cavity size was normal. There is mildly increased left ventricular wall thickness. Left ventricular diastolic Doppler parameters are consistent with impaired  relaxation. There is right ventricular volume and pressure overload and respiratory related leftward septal shift, likely secondary to pulmonary disease, cannot exclude pericardial constriction.  2. The right ventricle has preserved function at the base and mid ventricle, with moderately reduced function at the RV apex. The cavity was moderately enlarged. There is no increase in right ventricular wall thickness. Right ventricular systolic  pressure is severely elevated with an estimated pressure of 71.7 mmHg.  3. Sclerosis without any evidence of stenosis of the aortic valve.  4. Right atrial size was mildly dilated.     Antimicrobials:  Ceftriaxone azithromycin 5/20   Subjective: He is alert this morning. He is sitting in bed, eating breakfast. He is breathing better, but not back at baseline. He relates he has history of seizure. I explain to him episodes from yesterday AMS was likely related to increase CO2.    Objective: Vitals:   12/17/18 0335 12/17/18 0340 12/17/18 0345 12/17/18 0825  BP:  (!) 122/99  109/85  Pulse: 88 90 86 92  Resp: (!) 22 (!) 21 20 12   Temp:  97.9 F (36.6 C)    TempSrc:  Oral  Oral  SpO2: 90% (!) 89% (!) 87% 91%  Weight:      Height:        Intake/Output Summary (Last 24 hours) at 12/17/2018 0837 Last data filed at 12/17/2018 0658 Gross per 24 hour  Intake 1050 ml  Output 2050 ml  Net -1000 ml   Filed Weights   12/15/18 2133 12/16/18 0642  Weight: (!) 144.6 kg (!) 143 kg    Examination:  General exam: Alert, following command.  Respiratory system: Bilateral wheezing Cardiovascular system: S 1, S 2  RRR Gastrointestinal system: BS present, soft, nt Central nervous system: non focal.  Extremities: Symmetric power.  Skin; no rashes.     Data Reviewed: I have personally reviewed following labs and imaging studies  CBC: Recent Labs  Lab 12/15/18 1404 12/15/18 1814 12/16/18 0510 12/16/18 0933 12/17/18 0727  WBC 13.3*  --  14.2* 12.7* 12.6*  NEUTROABS 9.6*  --   --   --   --   HGB 18.2* 21.4* 18.3* 18.1* 17.1*  HCT 63.7* 63.0* 63.8* 62.5* 58.5*  MCV 84.9  --  84.3 84.0 83.6  PLT 139*  --  131* 125* 150*   Basic Metabolic Panel: Recent Labs  Lab 12/15/18 1404 12/15/18 1616 12/15/18 1736 12/15/18 1814 12/16/18 0510 12/16/18 0933 12/17/18 0727  NA QUANTITY NOT SUFFICIENT, UNABLE TO PERFORM TEST 141  --  138 140 140 138  K QUANTITY NOT SUFFICIENT, UNABLE TO PERFORM TEST 4.7  --  4.4 4.7 4.7 4.0  CL QUANTITY NOT SUFFICIENT, UNABLE TO PERFORM TEST 97*  --   --  91* 95* 90*  CO2 QUANTITY NOT SUFFICIENT, UNABLE TO PERFORM TEST 34*  --   --  37* 34* 36*  GLUCOSE QUANTITY NOT SUFFICIENT, UNABLE TO PERFORM TEST 128*  --   --  147* 174* 183*  BUN QUANTITY NOT SUFFICIENT, UNABLE TO PERFORM TEST 11  --   --  14 15 20   CREATININE QUANTITY NOT SUFFICIENT, UNABLE TO PERFORM TEST 0.89  --   --  0.88 0.80 0.94  CALCIUM QUANTITY NOT SUFFICIENT, UNABLE TO PERFORM TEST 8.5*  --   --  8.5* 8.2* 8.0*  MG  --   --  2.3  --  2.3  --   --   PHOS  --   --  3.6  --   --   --   --    GFR: Estimated Creatinine Clearance: 115 mL/min (by C-G formula based on SCr of 0.94 mg/dL). Liver Function Tests: Recent Labs  Lab 12/15/18 1404 12/15/18 1616  AST QUANTITY NOT SUFFICIENT, UNABLE TO PERFORM TEST 30  ALT QUANTITY NOT SUFFICIENT, UNABLE TO PERFORM TEST 26  ALKPHOS QUANTITY NOT SUFFICIENT, UNABLE TO PERFORM TEST 47  BILITOT QUANTITY NOT SUFFICIENT, UNABLE TO PERFORM TEST 1.5*  PROT QUANTITY NOT SUFFICIENT, UNABLE TO PERFORM TEST 6.7  ALBUMIN QUANTITY NOT SUFFICIENT, UNABLE TO PERFORM TEST 3.7    No results for input(s): LIPASE, AMYLASE in the last 168 hours. No results for input(s): AMMONIA in the last 168 hours. Coagulation Profile: No results for input(s): INR, PROTIME in the last 168 hours. Cardiac Enzymes: Recent Labs  Lab 12/15/18 2115 12/16/18 0510 12/16/18 0933 12/16/18 1245 12/16/18 2052  TROPONINI 0.03* <0.03 <0.03 <0.03 <0.03   BNP (last 3 results) No results for input(s): PROBNP in the last 8760 hours. HbA1C: No results for input(s): HGBA1C in the last 72 hours. CBG: Recent Labs  Lab 12/16/18 0814  GLUCAP 156*   Lipid Profile: No results for input(s): CHOL, HDL, LDLCALC, TRIG, CHOLHDL, LDLDIRECT in the last 72 hours. Thyroid Function Tests: Recent Labs    12/15/18 1736  TSH 1.262   Anemia Panel: No results for input(s): VITAMINB12, FOLATE, FERRITIN, TIBC, IRON, RETICCTPCT in the last 72 hours. Sepsis Labs: Recent Labs  Lab 12/15/18 1736 12/16/18 0933 12/16/18 1245 12/17/18 0421  PROCALCITON  --  0.12  --  <0.10  LATICACIDVEN 1.2 1.4 1.1  --     Recent Results (from the past 240 hour(s))  SARS Coronavirus 2 (CEPHEID- Performed in [facility] hospital lab), Hosp Order     Status: None   Collection Time: 12/15/18  2:49 PM  Result Value Ref Range Status   SARS Coronavirus 2 NEGATIVE NEGATIVE Final    Comment: (NOTE) If result is NEGATIVE SARS-CoV-2 target nucleic acids are NOT DETECTED. The SARS-CoV-2 RNA is generally detectable in upper and lower  respiratory specimens during the acute phase of infection. The lowest  concentration of SARS-CoV-2 viral copies this assay can detect is 250  copies / mL. A negative result does not preclude SARS-CoV-2 infection  and should not be used as the sole basis for treatment or other  patient management decisions.  A negative result may occur with  improper specimen collection / handling, submission of specimen other  than nasopharyngeal swab, presence of viral mutation(s) within the  areas targeted by  this assay, and inadequate number of viral copies  (<250 copies / mL). A negative result must be combined with clinical  observations, patient history, and epidemiological information. If result is POSITIVE SARS-CoV-2 target nucleic acids are DETECTED. The SARS-CoV-2 RNA is generally detectable in upper and lower  respiratory specimens dur ing the acute phase of infection.  Positive  results are indicative of active infection with SARS-CoV-2.  Clinical  correlation with patient history and other diagnostic information is  necessary to determine patient infection status.  Positive results do  not rule out bacterial infection or co-infection with other viruses. If result is PRESUMPTIVE POSTIVE SARS-CoV-2 nucleic acids MAY BE PRESENT.   A presumptive positive result was obtained on the submitted specimen  and confirmed on repeat testing.  While 2019 novel coronavirus  (SARS-CoV-2) nucleic acids may be present in the submitted sample  additional confirmatory testing may be necessary for epidemiological  and / or clinical management purposes  to differentiate between  SARS-CoV-2 and other Sarbecovirus currently known to infect humans.  If clinically indicated additional testing with an alternate test  methodology [phone]) is advised. The SARS-CoV-2 RNA is generally  detectable in upper and lower respiratory sp ecimens during the acute  phase of infection. The expected result is Negative. Fact Sheet for Patients:  StrictlyIdeas.no Fact Sheet for Healthcare Providers: BankingDealers.co.za This test is not yet approved or cleared by the [place] FDA and has been authorized for detection and/or diagnosis of SARS-CoV-2 by FDA under an Emergency Use Authorization (EUA).  This EUA will remain in effect (meaning this test can be used) for the duration of the COVID-19 declaration under Section 564(b)(1) of the Act, 21 U.S.C. section  360bbb-3(b)(1), unless the authorization is terminated or revoked sooner. Performed at [facility], [place] [address] [place], [place] [address]   Culture, blood (routine x 2)     Status: None (Preliminary result)   Collection Time: 12/15/18  3:20 PM  Result Value Ref Range Status   Specimen Description BLOOD RIGHT ANTECUBITAL  Final   Special Requests   Final    BOTTLES DRAWN AEROBIC AND ANAEROBIC Blood Culture results may not be optimal due to an inadequate volume of blood received in culture bottles   Culture   Final    NO GROWTH < 24 HOURS Performed at [facility], [place] [address] [place], [place] [address]    Report Status PENDING  Incomplete  Culture, blood (routine x 2)     Status: None (Preliminary result)   Collection Time: 12/15/18  5:36 PM  Result Value Ref Range Status   Specimen Description BLOOD LEFT ANTECUBITAL  Final   Special Requests   Final    BOTTLES DRAWN AEROBIC AND ANAEROBIC Blood Culture results may not be optimal due to an inadequate volume of blood received in culture bottles   Culture   Final    NO GROWTH < 24 HOURS Performed at [facility], [place] [address], [place], [place] [address]    Report Status PENDING  Incomplete         Radiology Studies: Ct Head Wo Contrast  Result Date: 12/16/2018 CLINICAL DATA:  Encephalopathy EXAM: CT HEAD WITHOUT CONTRAST TECHNIQUE: Contiguous axial images were obtained from the base of the skull through the vertex without intravenous contrast. COMPARISON:  None. FINDINGS: Brain: No evidence of acute infarction, hemorrhage, hydrocephalus, extra-axial collection or mass lesion/mass effect. Vascular: Blood pool density is higher than normal in the arteries and veins compatible with polycythemia. No asymmetry. Skull: Negative Sinuses/Orbits: Paranasal sinuses clear. No orbital mass. Bilateral cataract surgery Other: None IMPRESSION: No acute abnormality. Electronically Signed   By: Franchot Gallo M.D.   On:  12/16/2018 09:46   Dg Chest Port 1 View  Result Date: 12/16/2018 CLINICAL DATA:  Shortness of breath. EXAM: PORTABLE CHEST 1 VIEW COMPARISON:  One-view chest x-ray 12/15/2018 FINDINGS: Heart is  mildly enlarged. Pulmonary vascular congestion has improved. No significant airspace consolidation is present. Visualized soft tissues and bony thorax are unremarkable. IMPRESSION: 1. Cardiomegaly with improving pulmonary vascular congestion. 2. No significant airspace consolidation. Electronically Signed   By: San Morelle M.D.   On: 12/16/2018 13:07   Dg Chest Port 1 View  Result Date: 12/15/2018 CLINICAL DATA:  Shortness of breath EXAM: PORTABLE CHEST 1 VIEW COMPARISON:  Chest x-ray dated 07/12/2014 FINDINGS: The cardiac silhouette is enlarged. There is scattered hazy airspace opacities bilaterally. No pneumothorax. No large pleural effusion. There is no acute osseous abnormality. The lungs are somewhat hyperexpanded. IMPRESSION: 1. Cardiomegaly. 2. Hazy airspace opacities bilaterally may represent findings of an atypical pneumonia versus developing pulmonary edema. 3. Mildly hyperexpanded lungs which can be seen in patients with COPD. Electronically Signed   By: Constance Holster M.D.   On: 12/15/2018 14:52        Scheduled Meds: . aspirin  325 mg Oral Daily  . furosemide  40 mg Intravenous BID  . heparin  5,000 Units Subcutaneous Q8H  . ipratropium-albuterol  3 mL Nebulization Q6H  . methylPREDNISolone (SOLU-MEDROL) injection  40 mg Intravenous Daily  . multivitamin with minerals  1 tablet Oral Daily  . pantoprazole  40 mg Oral Daily  . sodium chloride flush  3 mL Intravenous Q12H   Continuous Infusions: . azithromycin 500 mg (12/16/18 1157)  . cefTRIAXone (ROCEPHIN)  IV 1 g (12/16/18 1039)     LOS: 2 days    Time spent: 35 minutes    Elmarie Shiley, MD [facility] Pager [phone]  If 7PM-7AM, please contact night-coverage [email or web address] Password [facility]  12/17/2018, 8:37 AM

## 2018-12-18 DIAGNOSIS — J441 Chronic obstructive pulmonary disease with (acute) exacerbation: Secondary | ICD-10-CM

## 2018-12-18 DIAGNOSIS — I5033 Acute on chronic diastolic (congestive) heart failure: Secondary | ICD-10-CM

## 2018-12-18 LAB — BASIC METABOLIC PANEL
Anion gap: 9 (ref 5–15)
BUN: 18 mg/dL (ref 8–23)
CO2: 38 mmol/L — ABNORMAL HIGH (ref 22–32)
Calcium: 8 mg/dL — ABNORMAL LOW (ref 8.9–10.3)
Chloride: 92 mmol/L — ABNORMAL LOW (ref 98–111)
Creatinine, Ser: 0.72 mg/dL (ref 0.61–1.24)
GFR calc Af Amer: >60 mL/min (ref >60)
GFR calc non Af Amer: >60 mL/min (ref >60)
Glucose, Bld: 99 mg/dL (ref 70–99)
Potassium: 4.1 mmol/L (ref 3.5–5.1)
Sodium: 139 mmol/L (ref 135–145)

## 2018-12-18 LAB — CBC
HCT: 60.2 % — ABNORMAL HIGH (ref 39.0–52.0)
Hemoglobin: 17.8 g/dL — ABNORMAL HIGH (ref 13.0–17.0)
MCH: 24 pg — ABNORMAL LOW (ref 26.0–34.0)
MCHC: 29.6 g/dL — ABNORMAL LOW (ref 30.0–36.0)
MCV: 81.1 fL (ref 80.0–100.0)
Platelets: 117 10*3/uL — ABNORMAL LOW (ref 150–400)
RBC: 7.42 MIL/uL — ABNORMAL HIGH (ref 4.22–5.81)
RDW: 21.1 % — ABNORMAL HIGH (ref 11.5–15.5)
WBC: 11.7 10*3/uL — ABNORMAL HIGH (ref 4.0–10.5)
nRBC: 0.3 % — ABNORMAL HIGH (ref 0.0–0.2)

## 2018-12-18 LAB — PROCALCITONIN: Procalcitonin: <0.1 ng/mL

## 2018-12-18 MED ORDER — IPRATROPIUM-ALBUTEROL 20-100 MCG/ACT IN AERS: 1.0000 | INHALATION_SPRAY | Freq: Four times a day (QID) | RESPIRATORY_TRACT | 0 refills | Status: DC | PRN

## 2018-12-18 MED ORDER — FUROSEMIDE 40 MG PO TABS: 20.0000 mg | ORAL_TABLET | Freq: Every day | ORAL | 0 refills | Status: AC

## 2018-12-18 MED ORDER — PREDNISONE 20 MG PO TABS: 40.0000 mg | ORAL_TABLET | Freq: Every day | ORAL | 0 refills | Status: AC

## 2018-12-18 MED ORDER — ALBUTEROL SULFATE HFA 108 (90 BASE) MCG/ACT IN AERS: 2.0000 | INHALATION_SPRAY | Freq: Four times a day (QID) | RESPIRATORY_TRACT | 0 refills | Status: DC | PRN

## 2018-12-18 NOTE — Discharge Instructions (Signed)

## 2018-12-18 NOTE — TOC Transition Note (Signed)
Transition of Care [facility]) - CM/SW Discharge Note   Patient Details  Name: Philip Powell MRN: [id number] Date of Birth: Oct 15, 1950  [facility] CM/SW Contact:  Sharin Mons, RN Phone Number: 12/18/2018, 11:52 AM   Clinical Narrative:    Admitted with  Acute on chronic respiratory failure with hypoxia and hypercapnia .  Transition to home today with oxygen. Referral made with [employer] for DME: oxygen. Portable oxygen tank will be delivered to bedside prior to discharge. Pt states wife to provide transportation to home. Pt to f/u with PCP/ Dr. Octavio Graves within one week.     Philip Powell (Spouse)     [phone]           Patient Goals and CMS Choice        Discharge Placement                       Discharge Plan and Services                                     Social Determinants of Health (SDOH) Interventions     Readmission Risk Interventions No flowsheet data found.

## 2018-12-18 NOTE — Progress Notes (Addendum)
The patient has been seen in conjunction with Vin Bhagat, PAC. All aspects of care have been considered and discussed. The patient has been personally interviewed, examined, and all clinical data has been reviewed.   Please see the amended recommendations below.  Have discussed with current hospitalist team.   Progress Note  Patient Name: Philip Powell Date of Encounter: 12/18/2018  Primary Cardiologist: No primary care provider on file.   Subjective   Insisting to be discharged. Feels better. Has tendency to manage his own care raising concerns about compliance.  Inpatient Medications    Scheduled Meds: . aspirin  325 mg Oral Daily  . heparin  5,000 Units Subcutaneous Q8H  . ipratropium-albuterol  3 mL Nebulization Q6H  . methylPREDNISolone (SOLU-MEDROL) injection  40 mg Intravenous Daily  . multivitamin with minerals  1 tablet Oral Daily  . pantoprazole  40 mg Oral Daily  . sodium chloride flush  3 mL Intravenous Q12H   Continuous Infusions: . azithromycin 500 mg (12/17/18 1029)  . cefTRIAXone (ROCEPHIN)  IV 1 g (12/17/18 1153)   PRN Meds: acetaminophen **OR** acetaminophen, albuterol, HYDROcodone-acetaminophen   Vital Signs    Vitals:   12/18/18 0500 12/18/18 0743 12/18/18 0753 12/18/18 0800  BP:    127/80  Pulse:  85  71  Resp:  (!) 176  14  Temp:    98.5 F (36.9 C)  TempSrc:    Axillary  SpO2:  98% 94% (!) 89%  Weight: (!) 140.8 kg     Height:        Intake/Output Summary (Last 24 hours) at 12/18/2018 0838 Last data filed at 12/18/2018 0600 Gross per 24 hour  Intake 720 ml  Output 2600 ml  Net -1880 ml   Last 3 Weights 12/18/2018 12/16/2018 12/15/2018  Weight (lbs) 310 lb 6.5 oz 315 lb 4.1 oz 318 lb 12.6 oz  Weight (kg) 140.8 kg 143 kg 144.6 kg      Telemetry    NSR with PVC's - Personally Reviewed  ECG    Not repeated - Personally Reviewed  Physical Exam  Obese and plethoric GEN: No acute distress.   Neck: Difficult to gauge JVD  Cardiac: RRR, no murmurs, rubs, or gallops.  Respiratory: Clear to auscultation bilaterally. GI: Soft, nontender, non-distended  MS: No edema; No deformity. Neuro:  Nonfocal  Psych: Normal affect   Labs    Chemistry Recent Labs  Lab 12/15/18 1404 12/15/18 1616  12/16/18 0933 12/17/18 0727 12/18/18 0637  NA QUANTITY NOT SUFFICIENT, UNABLE TO PERFORM TEST 141   < > 140 138 139  K QUANTITY NOT SUFFICIENT, UNABLE TO PERFORM TEST 4.7   < > 4.7 4.0 4.1  CL QUANTITY NOT SUFFICIENT, UNABLE TO PERFORM TEST 97*   < > 95* 90* 92*  CO2 QUANTITY NOT SUFFICIENT, UNABLE TO PERFORM TEST 34*   < > 34* 36* 38*  GLUCOSE QUANTITY NOT SUFFICIENT, UNABLE TO PERFORM TEST 128*   < > 174* 183* 99  BUN QUANTITY NOT SUFFICIENT, UNABLE TO PERFORM TEST 11   < > 15 20 18   CREATININE QUANTITY NOT SUFFICIENT, UNABLE TO PERFORM TEST 0.89   < > 0.80 0.94 0.72  CALCIUM QUANTITY NOT SUFFICIENT, UNABLE TO PERFORM TEST 8.5*   < > 8.2* 8.0* 8.0*  PROT QUANTITY NOT SUFFICIENT, UNABLE TO PERFORM TEST 6.7  --   --   --   --   ALBUMIN QUANTITY NOT SUFFICIENT, UNABLE TO PERFORM TEST 3.7  --   --   --   --  AST QUANTITY NOT SUFFICIENT, UNABLE TO PERFORM TEST 30  --   --   --   --   ALT QUANTITY NOT SUFFICIENT, UNABLE TO PERFORM TEST 26  --   --   --   --   ALKPHOS QUANTITY NOT SUFFICIENT, UNABLE TO PERFORM TEST 47  --   --   --   --   BILITOT QUANTITY NOT SUFFICIENT, UNABLE TO PERFORM TEST 1.5*  --   --   --   --   GFRNONAA QUANTITY NOT SUFFICIENT, UNABLE TO PERFORM TEST >60   < > >60 >60 >60  GFRAA QUANTITY NOT SUFFICIENT, UNABLE TO PERFORM TEST >60   < > >60 >60 >60  ANIONGAP QUANTITY NOT SUFFICIENT, UNABLE TO PERFORM TEST 10   < > 11 12 9    < > = values in this interval not displayed.     Hematology Recent Labs  Lab 12/16/18 0933 12/17/18 0727 12/18/18 0637  WBC 12.7* 12.6* 11.7*  RBC 7.44* 7.00* 7.42*  HGB 18.1* 17.1* 17.8*  HCT 62.5* 58.5* 60.2*  MCV 84.0 83.6 81.1  MCH 24.3* 24.4* 24.0*  MCHC 29.0* 29.2*  29.6*  RDW 20.6* 20.8* 21.1*  PLT 125* 126* 117*    Cardiac Enzymes Recent Labs  Lab 12/16/18 0510 12/16/18 0933 12/16/18 1245 12/16/18 2052  TROPONINI <0.03 <0.03 <0.03 <0.03   No results for input(s): TROPIPOC in the last 168 hours.   BNP Recent Labs  Lab 12/15/18 1404  BNP 430.7*     DDimer No results for input(s): DDIMER in the last 168 hours.   Radiology    Ct Head Wo Contrast  Result Date: 12/16/2018 CLINICAL DATA:  Encephalopathy EXAM: CT HEAD WITHOUT CONTRAST TECHNIQUE: Contiguous axial images were obtained from the base of the skull through the vertex without intravenous contrast. COMPARISON:  None. FINDINGS: Brain: No evidence of acute infarction, hemorrhage, hydrocephalus, extra-axial collection or mass lesion/mass effect. Vascular: Blood pool density is higher than normal in the arteries and veins compatible with polycythemia. No asymmetry. Skull: Negative Sinuses/Orbits: Paranasal sinuses clear. No orbital mass. Bilateral cataract surgery Other: None IMPRESSION: No acute abnormality. Electronically Signed   By: Franchot Gallo M.D.   On: 12/16/2018 09:46   Dg Chest Port 1 View  Result Date: 12/16/2018 CLINICAL DATA:  Shortness of breath. EXAM: PORTABLE CHEST 1 VIEW COMPARISON:  One-view chest x-ray 12/15/2018 FINDINGS: Heart is mildly enlarged. Pulmonary vascular congestion has improved. No significant airspace consolidation is present. Visualized soft tissues and bony thorax are unremarkable. IMPRESSION: 1. Cardiomegaly with improving pulmonary vascular congestion. 2. No significant airspace consolidation. Electronically Signed   By: San Morelle M.D.   On: 12/16/2018 13:07    Cardiac Studies   Echo 12/15/2018 IMPRESSIONS   1. The left ventricle has normal systolic function with an ejection fraction of 60-65%. The cavity size was normal. There is mildly increased left ventricular wall thickness. Left ventricular diastolic Doppler parameters are consistent  with impaired  relaxation. There is right ventricular volume and pressure overload and respiratory related leftward septal shift, likely secondary to pulmonary disease, cannot exclude pericardial constriction.  2. The right ventricle has preserved function at the base and mid ventricle, with moderately reduced function at the RV apex. The cavity was moderately enlarged. There is no increase in right ventricular wall thickness. Right ventricular systolic  pressure is severely elevated with an estimated pressure of 71.7 mmHg.  3. Sclerosis without any evidence of stenosis of the aortic valve.  4. Right  atrial size was mildly dilated.  Patient Profile     Philip Powell is a 68 y.o. male with a hx of hypoxic resp failure, COPD, OSA declined CPAP, cor pulmonale, D-CHF, polycythemia, obesity seen for respiratory failure in setting of pneumonia and acute dCHF.  COVID negative 12/15/2018.  Assessment & Plan    1. Acute on chronic diastolic CHF, secondary to RHF, hyperviscosity, chronic hypoxia, and obesity.  Could also have underlying coronary disease but in absence of anginal pain with overall poor prognosis, no clinical indication for ischemia evaluation.  Improved with diuresis.  Plan daily furosemide 20 mg/day.  Anticipate will need a higher dose but until we have history on lower dose therapy as an outpatient would be safer to start low and titrate as tolerated.  Should also consider adding low-dose spironolactone at some future point once furosemide dose is determined.  Also need to guard against development of hypokalemia which could produce significant arrhythmia.  Unfortunately, patient is not very amenable to recommendations and prefers to minimize medical intervention.  Not certain follow-up will be advantageous.  At discharge he will need a basic metabolic panel in 1 week. 2.  Acute on chronic cor pulmonale secondary to COPD, improving right heart failure with gentle diuresis 3.  Acute on  chronic hypoxic and hypercarbic respiratory failure, improving   Otherwise per primary team  [facility] will sign off.   Medication Recommendations: Furosemide 20 mg/day and uptitrate as outpatient to maintain euvolemia. Other recommendations (labs, testing, etc): Needs basic metabolic panel in 1 week. Follow up as an outpatient: No specific requirement for cardiac follow-up.  More appropriate for chronic pulmonary follow-up.  His basic diagnosis is cor pulmonale secondary to chronic bronchitis/hypoxic hypercarbic respiratory failure.  For questions or updates, please contact [place] Please consult [email or web address] for contact info under        SignedLeanor Kail, PA  12/18/2018, 8:38 AM

## 2018-12-18 NOTE — Progress Notes (Signed)
OT Cancellation Note  Patient Details Name: ROONEY SWAILS MRN: [id number] DOB: 1951-02-21   Cancelled Treatment:    Reason Eval/Treat Not Completed: OT screened, no needs identified, will sign off(per PT, pt is independent with adl and mobility)   Darryl Nestle) Marsa Aris OTR/L Acute Rehabilitation Services Pager: [phone] Office: [place] 12/18/2018, 7:29 AM

## 2018-12-18 NOTE — Discharge Summary (Addendum)
Physician Discharge Summary  Philip Powell FMB:[id number] DOB: May 01, 1951  PCP: Octavio Graves, DO  Admit date: 12/15/2018 Discharge date: 12/18/2018  Recommendations for Outpatient Follow-up:  1. Dr. Octavio Graves, PCP in 1 week with repeat labs (CBC & BMP). 2. Dr. Baird Lyons, [facility] on 01/06/2019 at 11 AM.  He will need outpatient PFTs and repeat sleep titration/sleep study. 3. Dr. Rozann Lesches, Cardiology  Home Health: None Equipment/Devices: Oxygen via nasal cannula at 4 L/min continuously.  Continue prior home CPAP.  Discharge Condition: Improved and stable. CODE STATUS: Full. Diet recommendation: Heart healthy diet.  Discharge Diagnoses:  Principal Problem:   Acute on chronic respiratory failure with hypoxia and hypercapnia (HCC) Active Problems:   OSA (obstructive sleep apnea)   Polycythemia   Essential hypertension   Cor pulmonale (HCC)   Acute diastolic heart failure [facility])   Brief Summary: 68 year old married male, lives with spouse and son, independent, retired [employer] scientist, PMH of chronic hypoxic and hypercapnic respiratory failure, briefly was on home oxygen for about 2 months in the past but not on it anymore, COPD, ongoing tobacco dependence, OSA/OHS on nocturnal CPAP, polycythemia requiring phlebotomies, cor pulmonale/chronic diastolic CHF, morbid obesity, presented to ED 12/16/2018 due to 2 days history of dyspnea on exertion, exertional central chest pressure, increased swelling of his feet and legs and reported hypoxia at home in the 70s.  His symptoms did not improve with a few doses of oral Lasix which he usually takes on an as-needed basis.  He used to see Dr. Melvyn Novas, Pulmonology before switching to the [facility]; However has not followed with a pulmonologist at the [facility] in years.  In the ED, patient was hypoxic at 95% on 5 L oxygen.  He was admitted for acute on chronic respiratory failure, multifactorial.  Pulmonology and cardiology were  consulted.  Assessment and plan:  1. Acute on chronic hypoxic and hypercapnic respiratory failure: Multifactorial in the setting of possible COPD exacerbation, cor pulmonale/acute on chronic diastolic CHF, OSA/OHS and PAH.  TTE 5/19: LVEF 60-65%.  He was treated with IV Lasix 40 mg every 12 hourly.  -4.1 L thus far.  Cardiology consultation and follow-up appreciated.  I discussed with Cardiology who have cleared him for discharge from their standpoint.  They recommend Furosemide 20 mg daily at discharge but anticipate that he will need a higher dose but prefer to start low (due to cor pulmonale) and titrate gradually as outpatient including considering adding low-dose Spironolactone as outpatient.  Patient has been counseled extensively regarding importance of compliance with all aspects of his medical care including tobacco cessation, fluid restriction, medication management and MD follow-up and he verbalizes understanding although get a sense of noncompliance on talking to him today.  Cardiology recommends follow-up mainly with Pulmonology and not much benefit to follow-up with them unless needed.  He was treated with IV Solu-Medrol, completed 3 days course of IV antibiotics, received bronchodilator nebulizations.  No clinical bronchospasm.  As per Pulmonary Consultation, complete total 5 days of steroids.  No antibiotics at discharge especially with negative procalcitonin and low suspicion for pneumonia.  He had developed worsening hypercapnic respiratory failure and associated mental status changes on 5/20 which resolved after initiation of BiPAP.  Pulmonology had recommended BiPAP at discharge for which patient will have to undergo overnight desaturation evaluation to qualify for home BiPAP.  Discussed this in detail with patient who insists on going home today and does not wish to stay another day in the hospital.  Pulmonology had then  advised that he could resume CPAP at discharge and they have arranged  follow-up as outpatient.  Thereby COPD exacerbation and cor pulmonale flare/acute on chronic diastolic CHF have improved.  Patient met criteria for home oxygen. 2. Acute metabolic encephalopathy/CO2 narcosis: Patient was lethargic on the morning of 5/20.  PCO2 was 94.  Pulmonology was consulted.  CT head was negative.  He was treated with BiPAP.  Mental status changes resolved and he is back to baseline. 3. Exertional chest pain: Suspect due to COPD exacerbation and decompensated CHF rather than true ACS.  Troponin cycled and unremarkable.  As per Cardiology signoff, he could have underlying coronary artery disease but in the absence of anginal pain with overall poor prognosis, no clinical indication for ischemia evaluation. 4. Polycythemia: Most likely secondary to COPD and cor pulmonale.  Patient reports that he undergoes therapeutic phlebotomy by his PCP, continue same.  Continue aspirin.  Tobacco cessation counseled. 5. OSA/OHS: Continue nightly CPAP as prior to admission, follow-up with pulmonology as outpatient for further evaluation. 6. Chronic osteoarthritis: Continue prior home pain regimen. 7. Possible pneumonia: Although pneumonia was suspected based on chest x-ray with questionable infiltrate, low index of pneumonia clinically.  He was treated with 3 days of IV ceftriaxone and azithromycin.  Procalcitonin is low.  SARS coronavirus 2: Negative.  Blood cultures x2: Negative to date.  No further antibiotics at discharge.  May consider repeating chest x-ray in 4 weeks. 8. Tobacco abuse: Cessation counseled.  Patient declines nicotine patch and says it does not work for him. 9. Morbid obesity/Body mass index is 42.1 kg/m. 10. Thrombocytopenia, chronic: Chronic 2018.  Relatively stable.  No bleeding reported.  Follow CBCs closely as outpatient  Consultations:  Cardiology  Pulmonology  Procedures:  BiPAP  TTE 12/15/2018:  IMPRESSIONS    1. The left ventricle has normal systolic  function with an ejection fraction of 60-65%. The cavity size was normal. There is mildly increased left ventricular wall thickness. Left ventricular diastolic Doppler parameters are consistent with impaired  relaxation. There is right ventricular volume and pressure overload and respiratory related leftward septal shift, likely secondary to pulmonary disease, cannot exclude pericardial constriction.  2. The right ventricle has preserved function at the base and mid ventricle, with moderately reduced function at the RV apex. The cavity was moderately enlarged. There is no increase in right ventricular wall thickness. Right ventricular systolic  pressure is severely elevated with an estimated pressure of 71.7 mmHg.  3. Sclerosis without any evidence of stenosis of the aortic valve.  4. Right atrial size was mildly dilated.    Discharge Instructions  Discharge Instructions    (HEART FAILURE PATIENTS) Call MD:  Anytime you have any of the following symptoms: 1) 3 pound weight gain in 24 hours or 5 pounds in 1 week 2) shortness of breath, with or without a dry hacking cough 3) swelling in the hands, feet or stomach 4) if you have to sleep on extra pillows at night in order to breathe.   Complete by:  As directed    Call MD for:   Complete by:  As directed    Recurrent confusion or altered mental status.   Call MD for:  difficulty breathing, headache or visual disturbances   Complete by:  As directed    Call MD for:  extreme fatigue   Complete by:  As directed    Call MD for:  persistant dizziness or light-headedness   Complete by:  As directed  Call MD for:  temperature >100.4   Complete by:  As directed    Diet - low sodium heart healthy   Complete by:  As directed    Discharge instructions   Complete by:  As directed    Continue CPAP at prior home settings until outpatient follow-up with Pulmonology.   Increase activity slowly   Complete by:  As directed        Medication List     TAKE these medications   albuterol 108 (90 Base) MCG/ACT inhaler Commonly known as:  VENTOLIN HFA Inhale 2 puffs into the lungs every 6 (six) hours as needed for wheezing or shortness of breath.   aspirin 325 MG tablet Take 325 mg by mouth daily.   Centrum Adults Tabs Take 1 tablet by mouth daily.   furosemide 40 MG tablet Commonly known as:  Lasix Take 0.5 tablets (20 mg total) by mouth daily. What changed:  how much to take   HYDROcodone-acetaminophen 7.5-325 MG tablet Commonly known as:  NORCO Take 1 tablet by mouth 3 (three) times daily.   ibuprofen 200 MG tablet Commonly known as:  ADVIL Take 400 mg by mouth every 6 (six) hours as needed for mild pain.   omeprazole 20 MG capsule Commonly known as:  PRILOSEC Take 20 mg by mouth daily as needed (acid reflux).   predniSONE 20 MG tablet Commonly known as:  DELTASONE Take 2 tablets (40 mg total) by mouth daily with breakfast for 3 days. Start taking on:  Dec 19, 2018   testosterone cypionate 100 MG/ML injection Commonly known as:  DEPOTESTOTERONE CYPIONATE Inject 100 mg into the muscle every 14 (fourteen) days. For IM use only   VITAMIN C PO Take 1 tablet by mouth daily.      Follow-up Information    Deneise Lever, MD Follow up.   Specialty:  Pulmonary Disease Why:  appointment on June 10 at 11 am.  Contact information: [place] [address] [phone]        Octavio Graves, DO. Schedule an appointment as soon as possible for a visit in 1 week(s).   Why:  To be seen with repeat labs (CBC & BMP). Contact information: [address] [place] [address] [phone]        Satira Sark, MD .   Specialty:  Cardiology Contact information: [place] [address] [phone]          No Known Allergies    Procedures/Studies: Ct Head Wo Contrast  Result Date: 12/16/2018 CLINICAL DATA:  Encephalopathy EXAM: CT HEAD WITHOUT CONTRAST TECHNIQUE:  Contiguous axial images were obtained from the base of the skull through the vertex without intravenous contrast. COMPARISON:  None. FINDINGS: Brain: No evidence of acute infarction, hemorrhage, hydrocephalus, extra-axial collection or mass lesion/mass effect. Vascular: Blood pool density is higher than normal in the arteries and veins compatible with polycythemia. No asymmetry. Skull: Negative Sinuses/Orbits: Paranasal sinuses clear. No orbital mass. Bilateral cataract surgery Other: None IMPRESSION: No acute abnormality. Electronically Signed   By: Franchot Gallo M.D.   On: 12/16/2018 09:46   Dg Chest Port 1 View  Result Date: 12/16/2018 CLINICAL DATA:  Shortness of breath. EXAM: PORTABLE CHEST 1 VIEW COMPARISON:  One-view chest x-ray 12/15/2018 FINDINGS: Heart is mildly enlarged. Pulmonary vascular congestion has improved. No significant airspace consolidation is present. Visualized soft tissues and bony thorax are unremarkable. IMPRESSION: 1. Cardiomegaly with improving pulmonary vascular congestion. 2. No significant  airspace consolidation. Electronically Signed   By: San Morelle M.D.   On: 12/16/2018 13:07   Dg Chest Port 1 View  Result Date: 12/15/2018 CLINICAL DATA:  Shortness of breath EXAM: PORTABLE CHEST 1 VIEW COMPARISON:  Chest x-ray dated 07/12/2014 FINDINGS: The cardiac silhouette is enlarged. There is scattered hazy airspace opacities bilaterally. No pneumothorax. No large pleural effusion. There is no acute osseous abnormality. The lungs are somewhat hyperexpanded. IMPRESSION: 1. Cardiomegaly. 2. Hazy airspace opacities bilaterally may represent findings of an atypical pneumonia versus developing pulmonary edema. 3. Mildly hyperexpanded lungs which can be seen in patients with COPD. Electronically Signed   By: Constance Holster M.D.   On: 12/15/2018 14:52      Subjective: Patient was interviewed and examined along with his RN in the room.  As soon as I walked into the room,  patient insisted on being discharged today.  He states that he is feeling much better compared to admission with improvement in dyspnea, even with exertion, decreased leg swelling, denies orthopnea and reportedly tolerated BiPAP overnight.  As per RN, did not undergo desaturation study last night.  No chest pain, cough or fevers reported.  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  Continue to smoke prior to admission and failed 5 attempts to quit in the past.  Declines nicotine patch.  Discharge Exam:  Vitals:   12/18/18 0500 12/18/18 0743 12/18/18 0753 12/18/18 0800  BP:    127/80  Pulse:  85  71  Resp:  (!) 176  14  Temp:    98.5 F (36.9 C)  TempSrc:    Axillary  SpO2:  98% 94% (!) 89%  Weight: (!) 140.8 kg     Height:        General: Pleasant middle-age male, moderately built and morbidly obese, sitting up comfortably at edge of bed. Cardiovascular: S1 & S2 heard, RRR, S1/S2 +. No murmurs, rubs, gallops or clicks. No JVD or pedal edema.  Telemetry personally reviewed: Sinus rhythm. Respiratory: Clear to auscultation without wheezing, rhonchi or crackles. No increased work of breathing. Abdominal:  Non distended but protuberant/obese, non tender & soft. No organomegaly or masses appreciated. Normal bowel sounds heard. CNS: Alert and oriented. No focal deficits. Extremities: no edema, no cyanosis.  Symmetric 5 x 5 power.    The results of significant diagnostics from this hospitalization (including imaging, microbiology, ancillary and laboratory) are listed below for reference.     Microbiology: Recent Results (from the past 240 hour(s))  SARS Coronavirus 2 (CEPHEID- Performed in [place] hospital lab), Hosp Order     Status: None   Collection Time: 12/15/18  2:49 PM  Result Value Ref Range Status   SARS Coronavirus 2 NEGATIVE NEGATIVE Final    Comment: (NOTE) If result is NEGATIVE SARS-CoV-2 target nucleic acids are NOT DETECTED. The  SARS-CoV-2 RNA is generally detectable in upper and lower  respiratory specimens during the acute phase of infection. The lowest  concentration of SARS-CoV-2 viral copies this assay can detect is 250  copies / mL. A negative result does not preclude SARS-CoV-2 infection  and should not be used as the sole basis for treatment or other  patient management decisions.  A negative result may occur with  improper specimen collection / handling, submission of specimen other  than nasopharyngeal swab, presence of viral mutation(s) within the  areas targeted by this assay, and inadequate number of viral copies  (<250  copies / mL). A negative result must be combined with clinical  observations, patient history, and epidemiological information. If result is POSITIVE SARS-CoV-2 target nucleic acids are DETECTED. The SARS-CoV-2 RNA is generally detectable in upper and lower  respiratory specimens dur ing the acute phase of infection.  Positive  results are indicative of active infection with SARS-CoV-2.  Clinical  correlation with patient history and other diagnostic information is  necessary to determine patient infection status.  Positive results do  not rule out bacterial infection or co-infection with other viruses. If result is PRESUMPTIVE POSTIVE SARS-CoV-2 nucleic acids MAY BE PRESENT.   A presumptive positive result was obtained on the submitted specimen  and confirmed on repeat testing.  While 2019 novel coronavirus  (SARS-CoV-2) nucleic acids may be present in the submitted sample  additional confirmatory testing may be necessary for epidemiological  and / or clinical management purposes  to differentiate between  SARS-CoV-2 and other Sarbecovirus currently known to infect humans.  If clinically indicated additional testing with an alternate test  methodology [id number]) is advised. The SARS-CoV-2 RNA is generally  detectable in upper and lower respiratory sp ecimens during the acute   phase of infection. The expected result is Negative. Fact Sheet for Patients:  StrictlyIdeas.no Fact Sheet for Healthcare Providers: BankingDealers.co.za This test is not yet approved or cleared by the [place] FDA and has been authorized for detection and/or diagnosis of SARS-CoV-2 by FDA under an Emergency Use Authorization (EUA).  This EUA will remain in effect (meaning this test can be used) for the duration of the COVID-19 declaration under Section 564(b)(1) of the Act, 21 U.S.C. section 360bbb-3(b)(1), unless the authorization is terminated or revoked sooner. Performed at [facility], [place] [address] [place], [place] [address]   Culture, blood (routine x 2)     Status: None (Preliminary result)   Collection Time: 12/15/18  3:20 PM  Result Value Ref Range Status   Specimen Description BLOOD RIGHT ANTECUBITAL  Final   Special Requests   Final    BOTTLES DRAWN AEROBIC AND ANAEROBIC Blood Culture results may not be optimal due to an inadequate volume of blood received in culture bottles   Culture   Final    NO GROWTH 3 DAYS Performed at [facility], [place] [address], [place], [place] [address]    Report Status PENDING  Incomplete  Culture, blood (routine x 2)     Status: None (Preliminary result)   Collection Time: 12/15/18  5:36 PM  Result Value Ref Range Status   Specimen Description BLOOD LEFT ANTECUBITAL  Final   Special Requests   Final    BOTTLES DRAWN AEROBIC AND ANAEROBIC Blood Culture results may not be optimal due to an inadequate volume of blood received in culture bottles   Culture   Final    NO GROWTH 3 DAYS Performed at [facility], [place] [address] [place], [place] [address]    Report Status PENDING  Incomplete     Labs: CBC: Recent Labs  Lab 12/15/18 1404 12/15/18 1814 12/16/18 0510 12/16/18 0933 12/17/18 0727 12/18/18 0637  WBC 13.3*  --  14.2* 12.7* 12.6* 11.7*  NEUTROABS 9.6*   --   --   --   --   --   HGB 18.2* 21.4* 18.3* 18.1* 17.1* 17.8*  HCT 63.7* 63.0* 63.8* 62.5* 58.5* 60.2*  MCV 84.9  --  84.3 84.0 83.6 81.1  PLT 139*  --  131* 125* 126* 117*  Basic Metabolic Panel: Recent Labs  Lab 12/15/18 1616 12/15/18 1736 12/15/18 1814 12/16/18 0510 12/16/18 0933 12/17/18 0727 12/18/18 0637  NA 141  --  138 140 140 138 139  K 4.7  --  4.4 4.7 4.7 4.0 4.1  CL 97*  --   --  91* 95* 90* 92*  CO2 34*  --   --  37* 34* 36* 38*  GLUCOSE 128*  --   --  147* 174* 183* 99  BUN 11  --   --  _0 CREATININE 0.89  --   --  0.88 0.80 0.94 0.72  CALCIUM 8.5*  --   --  8.5* 8.2* 8.0* 8.0*  MG  --  2.3  --  2.3  --   --   --   PHOS  --  3.6  --   --   --   --   --    Liver Function Tests: Recent Labs  Lab 12/15/18 1404 12/15/18 1616  AST QUANTITY NOT SUFFICIENT, UNABLE TO PERFORM TEST 30  ALT QUANTITY NOT SUFFICIENT, UNABLE TO PERFORM TEST 26  ALKPHOS QUANTITY NOT SUFFICIENT, UNABLE TO PERFORM TEST 47  BILITOT QUANTITY NOT SUFFICIENT, UNABLE TO PERFORM TEST 1.5*  PROT QUANTITY NOT SUFFICIENT, UNABLE TO PERFORM TEST 6.7  ALBUMIN QUANTITY NOT SUFFICIENT, UNABLE TO PERFORM TEST 3.7   BNP (last 3 results) Recent Labs    12/15/18 1404  BNP 430.7*   Cardiac Enzymes: Recent Labs  Lab 12/15/18 2115 12/16/18 0510 12/16/18 0933 12/16/18 1245 12/16/18 2052  TROPONINI 0.03* <0.03 <0.03 <0.03 <0.03   CBG: Recent Labs  Lab 12/16/18 0814  GLUCAP 156*   Thyroid function studies Recent Labs    12/15/18 1736  TSH 1.262     Time coordinating discharge: 40 minutes  SIGNED:  Vernell Leep, MD, FACP, [facility]. [facility]  To contact the attending provider between 7A-7P or the covering provider during after hours 7P-7A, please log into the web site [email or web address] and access using universal [place] password for that web site. If you do not have the password, please call the hospital operator.

## 2018-12-18 NOTE — Progress Notes (Signed)
SATURATION QUALIFICATIONS: (This note is used to comply with regulatory documentation for home oxygen)  Patient Saturations on Room Air at Rest = 92%  Patient Saturations on Room Air while Ambulating = 82%  Patient Saturations on 4 Liters of oxygen while Ambulating = 92%  Please briefly explain why patient needs home oxygen:

## 2018-12-20 LAB — CULTURE, BLOOD (ROUTINE X 2)
Culture: NO GROWTH
Culture: NO GROWTH

## 2018-12-23 ENCOUNTER — Encounter (HOSPITAL_COMMUNITY)
Admission: RE | Admit: 2018-12-23 | Discharge: 2018-12-23 | Disposition: A | Discharge status: Home / Self Care | Payer: Medicare Other | Source: Ambulatory Visit | Attending: *Deleted | Admitting: *Deleted

## 2018-12-23 ENCOUNTER — Other Ambulatory Visit: Payer: Self-pay

## 2018-12-23 DIAGNOSIS — D473 Essential (hemorrhagic) thrombocythemia: Secondary | ICD-10-CM | POA: Diagnosis present

## 2018-12-23 NOTE — Progress Notes (Signed)
Philip Powell presents today for phlebotomy per MD orders. HGB/HCT:17.8/60.2 Phlebotomy procedure started at 1304 and ended at 1311. 528 cc removed. Patient tolerated procedure well. IV needle removed intact. R antecubital

## 2019-01-01 ENCOUNTER — Encounter: Payer: Self-pay | Admitting: Cardiology

## 2019-01-01 ENCOUNTER — Ambulatory Visit (INDEPENDENT_AMBULATORY_CARE_PROVIDER_SITE_OTHER): Payer: Medicare Other | Admitting: Cardiology

## 2019-01-01 VITALS — BP 129/79 | HR 95 | Ht 72.0 in | Wt 317.0 lb

## 2019-01-01 DIAGNOSIS — I2781 Cor pulmonale (chronic): Secondary | ICD-10-CM | POA: Diagnosis not present

## 2019-01-01 DIAGNOSIS — G4733 Obstructive sleep apnea (adult) (pediatric): Secondary | ICD-10-CM | POA: Diagnosis not present

## 2019-01-01 DIAGNOSIS — I272 Pulmonary hypertension, unspecified: Secondary | ICD-10-CM

## 2019-01-01 DIAGNOSIS — D751 Secondary polycythemia: Secondary | ICD-10-CM

## 2019-01-01 DIAGNOSIS — J449 Chronic obstructive pulmonary disease, unspecified: Secondary | ICD-10-CM

## 2019-01-01 NOTE — Patient Instructions (Signed)
Medication Instructions:   Your physician recommends that you continue on your current medications as directed. Please refer to the Current Medication list given to you today.  Labwork:  NONE  Testing/Procedures:  NONE  Follow-Up:  Your physician recommends that you schedule a follow-up appointment in: 4 months. You will receive a reminder letter in the mail in about 1-2 months reminding you to call and schedule your appointment. If you don't receive this letter, please contact our office.  Any Other Special Instructions Will Be Listed Below (If Applicable).  If you need a refill on your cardiac medications before your next appointment, please call your pharmacy. 

## 2019-01-01 NOTE — Progress Notes (Signed)
Cardiology Office Note  Date: 01/01/2019   ID: Philip Powell, DOB Mar 28, 1951, MRN [id number]  PCP:  Octavio Graves, DO  Evaluating Cardiologist:  Rozann Lesches, MD Electrophysiologist:  None   Chief Complaint  Patient presents with  . Hospitalization Follow-up    History of Present Illness: Philip Powell is a 68 y.o. male referred to the office after recent hospitalization with acute on chronic hypoxic/hypercapnic respiratory failure in the setting of COPD with OSA and hypoventilation syndrome as well as acute on chronic cor pulmonale and diastolic heart failure.  He was seen by our cardiology service and discharged on oral Lasix.  He presents today wearing supplemental oxygen via nasal cannula, states that he was not using oxygen previously.  It is also difficult to get a sense of his compliance with CPAP, although it does not sound like he has been using anything regularly.  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  Recent echocardiogram in May demonstrated LVEF 60 to 65% with evidence of RV pressure and volume overload based on septal motion, moderate RV enlargement with dysfunction and severe pulmonary hypertension with PASP estimated at 72 mmHg.  He has a fairly longstanding history of the above conditions.  He previously saw Dr. Melvyn Novas in the Pulmonary clinic.  V/Q lung scan back in 2015 was negative for pulmonary embolus.  Today I talked with him about his recent hospital stay and testing.  He is scheduled to see Dr. Annamaria Boots for sleep medicine evaluation.  I talked with him about regular use of Lasix, weighing himself, also the fact that he is likely going to need a higher dose of diuretic.  I also told him that I thought he needed evaluation in the [facility], specifically to further assess his pulmonary hypertension for other treatment strategies, likely needs right heart catheterization.  This is however  most likely group 3 pulmonary hypertension.  Past Medical History:  Diagnosis Date  . COPD (chronic obstructive pulmonary disease) ([place])   . Cor pulmonale, chronic ([place])   . Essential hypertension   . History of pneumonia   . Obesity   . Obstructive sleep apnea   . Polycythemia     Past Surgical History:  Procedure Laterality Date  . CARPAL TUNNEL RELEASE  2008    Current Outpatient Medications  Medication Sig Dispense Refill  . albuterol (VENTOLIN HFA) 108 (90 Base) MCG/ACT inhaler Inhale 2 puffs into the lungs every 6 (six) hours as needed for wheezing or shortness of breath. 1 Inhaler 0  . Ascorbic Acid (VITAMIN C PO) Take 1 tablet by mouth daily.    Marland Kitchen aspirin 325 MG tablet Take 325 mg by mouth daily.    . famotidine (PEPCID) 20 MG tablet Take 20 mg by mouth daily.    . furosemide (LASIX) 40 MG tablet Take 0.5 tablets (20 mg total) by mouth daily. 30 tablet 0  . HYDROcodone-acetaminophen (NORCO) 7.5-325 MG per tablet Take 1 tablet by mouth 3 (three) times daily.     Marland Kitchen ibuprofen (ADVIL) 200 MG tablet Take 400 mg by mouth every 6 (six) hours as needed for mild pain.    Marland Kitchen loratadine (CLARITIN) 10 MG tablet Take 10 mg by mouth daily as needed for allergies.    . Multiple Vitamins-Minerals (CENTRUM ADULTS) TABS Take 1 tablet by mouth daily.    Marland Kitchen testosterone cypionate (DEPOTESTOTERONE CYPIONATE) 100 MG/ML injection Inject 100 mg into the muscle every 14 (fourteen)  days. For IM use only      No current facility-administered medications for this visit.    Allergies:  Patient has no known allergies.   Social History: The patient  reports that he has been smoking cigarettes. He has a 43.00 pack-year smoking history. He has never used smokeless tobacco. He reports current alcohol use. He reports that he does not use drugs.   Family History: The patient's family history includes Cancer in his brother, father, and mother; Heart disease in his maternal grandfather.   ROS:  Please see the  history of present illness. Otherwise, complete review of systems is positive for arthritic symptoms.  All other systems are reviewed and negative.   Physical Exam: VS:  BP 129/79 (BP Location: Right Arm, Cuff Size: Large)   Pulse 95   Ht 6' (1.829 m)   Wt (!) 317 lb (143.8 kg)   SpO2 94%   BMI 42.99 kg/m , BMI Body mass index is 42.99 kg/m.  Wt Readings from Last 3 Encounters:  01/01/19 (!) 317 lb (143.8 kg)  12/18/18 (!) 310 lb 6.5 oz (140.8 kg)  05/20/18 (!) 311 lb 15.2 oz (141.5 kg)    General: Morbidly obese male wearing oxygen via nasal cannula, also facemask.  Patient appears comfortable at rest. HEENT: Conjunctiva and lids normal. Neck: Supple, increased girth without obvious elevated JVP or carotid bruits, no thyromegaly. Lungs: Clear to auscultation, nonlabored breathing at rest. Cardiac: Regular rate and rhythm, no S3, soft systolic murmur. Abdomen: Protuberant, nontender, bowel sounds present, no guarding or rebound Extremities: Chronic appearing lower leg edema and venous stasis, distal pulses 2+. Skin: Warm and dry. Musculoskeletal: No kyphosis. Neuropsychiatric: Alert and oriented x3, affect grossly appropriate.  ECG:  An ECG dated 12/16/2018 was personally reviewed today and demonstrated:  Sinus rhythm with decreased R wave progression, probable biatrial enlargement.  Recent Labwork: 12/15/2018: ALT 26; AST 30; B Natriuretic Peptide 430.7; TSH 1.262 12/16/2018: Magnesium 2.3 12/18/2018: BUN 18; Creatinine, Ser 0.72; Hemoglobin 17.8; Platelets 117; Potassium 4.1; Sodium 139   Other Studies Reviewed Today:  Echocardiogram 12/15/2018:  1. The left ventricle has normal systolic function with an ejection fraction of 60-65%. The cavity size was normal. There is mildly increased left ventricular wall thickness. Left ventricular diastolic Doppler parameters are consistent with impaired  relaxation. There is right ventricular volume and pressure overload and respiratory  related leftward septal shift, likely secondary to pulmonary disease, cannot exclude pericardial constriction.  2. The right ventricle has preserved function at the base and mid ventricle, with moderately reduced function at the RV apex. The cavity was moderately enlarged. There is no increase in right ventricular wall thickness. Right ventricular systolic  pressure is severely elevated with an estimated pressure of 71.7 mmHg.  3. Sclerosis without any evidence of stenosis of the aortic valve.  4. Right atrial size was mildly dilated.  Chest x-ray 12/16/2018: FINDINGS: Heart is mildly enlarged. Pulmonary vascular congestion has improved. No significant airspace consolidation is present. Visualized soft tissues and bony thorax are unremarkable.  IMPRESSION: 1. Cardiomegaly with improving pulmonary vascular congestion. 2. No significant airspace consolidation.  Assessment and Plan:  1.  Severe pulmonary hypertension, most likely group 3 in the setting of obesity with hypoventilation syndrome, OSA, also COPD with tobacco abuse.  He has cor pulmonale with RV dysfunction of moderate degree and most recent PASP 72 mmHg by echocardiography.  He has not been on supplemental oxygen and it does not sound like he has had adequate treatment  for OSA.  He has a pending consultation with Dr. Annamaria Boots.  I have recommended referral to the [facility] for further evaluation, possibly right heart catheterization and assessment for other treatment strategies, he did not want to pursue that until he sees Dr. Annamaria Boots.  I would suggest referral be made after that visit however if Dr. Annamaria Boots is in agreement as well.  He will continue Lasix, I suspect he will need a higher dose and I talked with him about parameters in terms of weight change that would prompt additional doses.  2.  Morbid obesity.  Weight loss would be beneficial.  3.  Suspect inadequately treated OSA, consultation pending with Dr. Annamaria Boots.   4.  Polycythemia, on aspirin.  Patient states that he has also undergone phlebotomy under the direction of hematology for this.  No obvious history of thromboembolic disease.  VQ scan was negative back in 2015.  5.  COPD with tobacco abuse.  Smoking cessation discussed.  Medication Adjustments/Labs and Tests Ordered: Current medicines are reviewed at length with the patient today.  Concerns regarding medicines are outlined above.   Tests Ordered: No orders of the defined types were placed in this encounter.   Medication Changes: No orders of the defined types were placed in this encounter.   Disposition:  Follow up 3 months in the [place] office.  Signed, Satira Sark, MD, [facility] 01/01/2019 2:25 PM    [facility] [place], [place], [place] [address] Phone: [phone]; Fax: [phone]

## 2019-01-06 ENCOUNTER — Other Ambulatory Visit: Payer: Self-pay

## 2019-01-06 ENCOUNTER — Telehealth: Payer: Self-pay | Admitting: Internal Medicine

## 2019-01-06 ENCOUNTER — Ambulatory Visit (INDEPENDENT_AMBULATORY_CARE_PROVIDER_SITE_OTHER): Payer: Medicare Other | Admitting: Internal Medicine

## 2019-01-06 ENCOUNTER — Encounter: Payer: Self-pay | Admitting: Internal Medicine

## 2019-01-06 VITALS — BP 148/80 | HR 101 | Temp 98.4°F | Ht 72.0 in | Wt 318.0 lb

## 2019-01-06 DIAGNOSIS — J9611 Chronic respiratory failure with hypoxia: Secondary | ICD-10-CM

## 2019-01-06 DIAGNOSIS — J9612 Chronic respiratory failure with hypercapnia: Secondary | ICD-10-CM

## 2019-01-06 DIAGNOSIS — G4733 Obstructive sleep apnea (adult) (pediatric): Secondary | ICD-10-CM

## 2019-01-06 DIAGNOSIS — Z720 Tobacco use: Secondary | ICD-10-CM | POA: Insufficient documentation

## 2019-01-06 MED ORDER — COMPRESSOR/NEBULIZER MISC: 0 refills | Status: DC

## 2019-01-06 MED ORDER — IPRATROPIUM-ALBUTEROL 0.5-2.5 (3) MG/3ML IN SOLN: RESPIRATORY_TRACT | 12 refills | Status: DC

## 2019-01-06 NOTE — Patient Instructions (Addendum)
Order- Schedule in-center CPAP/ BIPAP titration with O2 2L   Dx Chronic respiratory failure with hypoxia and hypercapnia, OSA  Order- Schedule PFT      Order- DME    Compressor nebulizer machine and Duoneb    Dx chronic H&H respiratory failure. Scripts printed.                            Also- continue BIPAP 16/ 12, mask of choice, humidifier, supplies, airView                                       Needs replacement mask, hoses, filters now please   Please call as needed

## 2019-01-06 NOTE — Assessment & Plan Note (Signed)
Strongly encouraged efforts at weight loss.

## 2019-01-06 NOTE — Progress Notes (Signed)
01/06/2019- 66 yoM married smoker with hx OSA on BIPAP, coming to re-establish after LOV 2016. Recent Hosp discharge with dc plans including need for PFT and updated CPAP/ BIPAP titration because of CO2 retention. Medical problem list includes HBP, Cor Pulmonale, dCHF, OSA, Chronic Respiratory Failure with Hypercapnia and Hypoxia,  Polycythemia, Obesity, GERD BIPAP  16/12with O2 4L/ for sleep and continuous/ [employer] Download 93% compliance, AHI 8.2/ hr -----OSA currently on CPAP [employer]- Was in [place]. for pulmonary HTN and CO2 was high in and was advised needs new sleep study. Currently on 02@2L .  Body weight today 318 lbs-   Arrived on O2 2L- sat  96% He reports little cough or phlegm. Uses rescue inhaler < 1X/ day. Worked 3rd shift x 30 years. Frequent naps. Feels better now after recent hosp w diuresis. Feels better for awhile after phelbotomy for polycythemia. Epworth score 12 CXR 12/16/2018- 1V- IMPRESSION: 1. Cardiomegaly with improving pulmonary vascular congestion. 2. No significant airspace consolidation.  NPSG 08/10/14- AHI 72/ hr, CPAP failed to control and changed to BIPAP 23/17. Study done on 3L O2 for control of desaturation despite BIPAP. Body weight 307 lbs.  Prior to Admission medications   Medication Sig Start Date End Date Taking? Authorizing Provider  albuterol (VENTOLIN HFA) 108 (90 Base) MCG/ACT inhaler Inhale 2 puffs into the lungs every 6 (six) hours as needed for wheezing or shortness of breath. 12/18/18  Yes Hongalgi, Lenis Dickinson, MD  Ascorbic Acid (VITAMIN C PO) Take 1 tablet by mouth daily.   Yes [provider]  aspirin 325 MG tablet Take 325 mg by mouth daily.   Yes [provider]  famotidine (PEPCID) 20 MG tablet Take 20 mg by mouth daily.   Yes [provider]  furosemide (LASIX) 40 MG tablet Take 0.5 tablets (20 mg total) by mouth daily. 12/18/18  Yes Hongalgi, Lenis Dickinson, MD  HYDROcodone-acetaminophen (NORCO) 7.5-325 MG per tablet Take 1 tablet by  mouth 3 (three) times daily.  06/03/14  Yes [provider]  ibuprofen (ADVIL) 200 MG tablet Take 400 mg by mouth every 6 (six) hours as needed for mild pain.   Yes [provider]  loratadine (CLARITIN) 10 MG tablet Take 10 mg by mouth daily as needed for allergies.   Yes [provider]  Multiple Vitamins-Minerals (CENTRUM ADULTS) TABS Take 1 tablet by mouth daily.   Yes [provider]  testosterone cypionate (DEPOTESTOTERONE CYPIONATE) 100 MG/ML injection Inject 100 mg into the muscle every 14 (fourteen) days. For IM use only    Yes [provider]  ipratropium-albuterol (DUONEB) 0.5-2.5 (3) MG/3ML SOLN 1 neb every 4 hours if needed 01/06/19   Deneise Lever, MD  Nebulizers (COMPRESSOR/NEBULIZER) MISC Use as directed 01/06/19   Deneise Lever, MD   Past Medical History:  Diagnosis Date  . COPD (chronic obstructive pulmonary disease) ([place])   . Cor pulmonale, chronic ([place])   . Essential hypertension   . History of pneumonia   . Obesity   . Obstructive sleep apnea   . Polycythemia    Past Surgical History:  Procedure Laterality Date  . CARPAL TUNNEL RELEASE  2008   Family History  Problem Relation Age of Onset  . Cancer Brother   . Heart disease Maternal Grandfather   . Cancer Mother   . Cancer Father    Social History   Socioeconomic History  . Marital status: Married    Spouse name: Not on file  . Number of children: 3  .  Years of education: Not on file  . Highest education level: Not on file  Occupational History  . Occupation: lpn  Social Needs  . Financial resource strain: Not on file  . Food insecurity:    Worry: Not on file    Inability: Not on file  . Transportation needs:    Medical: Not on file    Non-medical: Not on file  Tobacco Use  . Smoking status: Current Every Day Smoker    Packs/day: 1.00    Years: 43.00    Pack years: 43.00    Types: Cigarettes  . Smokeless tobacco: Never Used  . Tobacco comment:  0.5  Substance and Sexual Activity  . Alcohol use: Yes    Alcohol/week: 0.0 standard drinks    Comment: occasionally  . Drug use: No  . Sexual activity: Not Currently  Lifestyle  . Physical activity:    Days per week: Not on file    Minutes per session: Not on file  . Stress: Not on file  Relationships  . Social connections:    Talks on phone: Not on file    Gets together: Not on file    Attends religious service: Not on file    Active member of club or organization: Not on file    Attends meetings of clubs or organizations: Not on file    Relationship status: Not on file  . Intimate partner violence:    Fear of current or ex partner: Not on file    Emotionally abused: Not on file    Physically abused: Not on file    Forced sexual activity: Not on file  Other Topics Concern  . Not on file  Social History Narrative  . Not on file   ROS-see HPI   + = positive Constitutional:    weight loss, night sweats, fevers, chills, fatigue, lassitude. HEENT:    headaches, difficulty swallowing, tooth/dental problems, sore throat,       sneezing, itching, ear ache, nasal congestion, post nasal drip, snoring CV:    chest pain, orthopnea, PND, swelling in lower extremities, anasarca,                                  dizziness, palpitations Resp:   +shortness of breath with exertion or at rest.                productive cough,   non-productive cough, coughing up of blood.              change in color of mucus.  wheezing.   Skin:    rash or lesions. GI:  +heartburn, indigestion, abdominal pain, nausea, vomiting, diarrhea,                 change in bowel habits, loss of appetite GU: dysuria, change in color of urine, no urgency or frequency.   flank pain. MS:   joint pain, stiffness, decreased range of motion, back pain. Neuro-     nothing unusual Psych:  change in mood or affect.  depression or anxiety.   memory loss.  OBJ- Physical Exam General- Alert, Oriented, Affect-appropriate,  Distress- none acute Skin- rash-none, lesions- none, excoriation- none Lymphadenopathy- none Head- atraumatic            Eyes- Gross vision intact, PERRLA, conjunctivae and secretions clear            Ears- Hearing, canals-normal  Nose- Clear, no-Septal dev, mucus, polyps, erosion, perforation             Throat- Mallampati II , mucosa clear , drainage- none, tonsils- atrophic Neck- flexible , trachea midline, no stridor , thyroid nl, carotid no bruit Chest - symmetrical excursion , unlabored           Heart/CV- RRR , no murmur , no gallop  , no rub, nl s1 s2                           - JVD- none , edema- none, stasis changes- none, varices- none           Lung- clear to P&A, wheeze- none, cough- none , dullness-none, rub- none           Chest wall-  Abd-  Br/ Gen/ Rectal- Not done, not indicated Extrem- cyanosis- none, clubbing, none, atrophy- none, strength- nl Neuro- grossly intact to observation

## 2019-01-06 NOTE — Assessment & Plan Note (Signed)
He has felt he benefits from BIPAP and download shows good compliance with fair control- AHI 8.2. Concern is that with hypercapnia, he may need more support. Plan- CPAP/ BIPAP titration study , to be done on O2 2L

## 2019-01-06 NOTE — Telephone Encounter (Signed)
Called [facility] and clarified information in regards to pt's duoneb solution. They stated that the directions needed to be changed and could be stated as as needed due to pt's insurance so I gave a verbal that they could change it to however it needed to be per insurance. Also provided pt's diagnosis code. Nothing further needed.

## 2019-01-06 NOTE — Assessment & Plan Note (Signed)
He indicates he has been making some efforts intermittently to quit. Discussed and strongly encouraged.

## 2019-01-06 NOTE — Assessment & Plan Note (Signed)
Polycythemia suggests insufficient oygenation. Hypercapnea noted at most recent hosp. Need to see how this relates to his OSA. There is likely a component of Obesity Hypoventilation as well as COPD.  Plan- Sleep study on 2L, PFT

## 2019-01-08 ENCOUNTER — Telehealth: Payer: Self-pay | Admitting: Internal Medicine

## 2019-01-08 NOTE — Telephone Encounter (Signed)
Spoke with [name] at [facility] and she is requesting a detailed written order for this pt. [name] do you have this? Please advise.

## 2019-01-11 NOTE — Telephone Encounter (Signed)
I called Amy with [facility] she stated that they were able to use the Rx that was faxed to them to process this Neb compressor order. The order will be shipped 6/16 per Amy

## 2019-01-15 ENCOUNTER — Encounter (HOSPITAL_COMMUNITY)
Admission: RE | Admit: 2019-01-15 | Discharge: 2019-01-15 | Disposition: A | Discharge status: Home / Self Care | Payer: Medicare Other | Source: Ambulatory Visit | Attending: *Deleted | Admitting: *Deleted

## 2019-01-15 ENCOUNTER — Encounter (HOSPITAL_COMMUNITY): Payer: Self-pay

## 2019-01-15 ENCOUNTER — Other Ambulatory Visit: Payer: Self-pay

## 2019-01-15 DIAGNOSIS — D473 Essential (hemorrhagic) thrombocythemia: Secondary | ICD-10-CM | POA: Diagnosis not present

## 2019-01-15 LAB — POCT HEMOGLOBIN-HEMACUE: Hemoglobin: 18.5 g/dL — ABNORMAL HIGH (ref 13.0–17.0)

## 2019-01-15 NOTE — Progress Notes (Signed)
Philip Powell presents today for phlebotomy per MD orders. HGB per [facility] 18.5 Phlebotomy procedure started at 1340 and ended at 1348 476 cc removed. Patient tolerated procedure well.  VS stable without complications.   IV needle removed intact.  Pressure dressing applied.

## 2019-01-25 ENCOUNTER — Other Ambulatory Visit (HOSPITAL_COMMUNITY)
Admission: RE | Admit: 2019-01-25 | Discharge: 2019-01-25 | Disposition: A | Discharge status: Home / Self Care | Payer: Medicare Other | Source: Ambulatory Visit | Attending: Internal Medicine | Admitting: Internal Medicine

## 2019-01-25 DIAGNOSIS — Z1159 Encounter for screening for other viral diseases: Secondary | ICD-10-CM | POA: Diagnosis not present

## 2019-01-25 DIAGNOSIS — Z01812 Encounter for preprocedural laboratory examination: Secondary | ICD-10-CM | POA: Diagnosis present

## 2019-01-25 LAB — SARS CORONAVIRUS 2 (TAT 6-24 HRS): SARS Coronavirus 2: NEGATIVE

## 2019-01-28 ENCOUNTER — Ambulatory Visit (HOSPITAL_BASED_OUTPATIENT_CLINIC_OR_DEPARTMENT_OTHER): Payer: Medicare Other | Attending: Internal Medicine | Admitting: Internal Medicine

## 2019-01-28 ENCOUNTER — Other Ambulatory Visit: Payer: Self-pay

## 2019-01-28 VITALS — Ht 72.0 in | Wt 317.0 lb

## 2019-01-28 DIAGNOSIS — G4733 Obstructive sleep apnea (adult) (pediatric): Secondary | ICD-10-CM

## 2019-02-01 ENCOUNTER — Other Ambulatory Visit: Payer: Self-pay

## 2019-02-01 ENCOUNTER — Other Ambulatory Visit (HOSPITAL_BASED_OUTPATIENT_CLINIC_OR_DEPARTMENT_OTHER): Payer: Self-pay

## 2019-02-01 DIAGNOSIS — G4733 Obstructive sleep apnea (adult) (pediatric): Secondary | ICD-10-CM

## 2019-02-06 DIAGNOSIS — G4733 Obstructive sleep apnea (adult) (pediatric): Secondary | ICD-10-CM | POA: Diagnosis not present

## 2019-02-06 NOTE — Procedures (Signed)
Patient Name: Philip Powell, Philip Powell Date: 01/28/2019 Gender: Male D.O.B: 06-18-1951 Age (years): 55 Referring Provider: Baird Lyons MD, ABSM Height (inches): 70 Interpreting Physician: Baird Lyons MD, ABSM Weight (lbs): 317 RPSGT: Baxter Flattery BMI: 45 MRN: [id number] Neck Size: 21.00  CLINICAL INFORMATION The patient is referred for a BiPAP titration to treat sleep apnea.  Date of NPSG, Split Night or HST:  NPSG 08/10/14  AHI 72/ hr, desaturation to 66%, body weight 307 lbs  SLEEP STUDY TECHNIQUE As per the AASM Manual for the Scoring of Sleep and Associated Events v2.3 (April 2016) with a hypopnea requiring 4% desaturations.  The channels recorded and monitored were frontal, central and occipital EEG, electrooculogram (EOG), submentalis EMG (chin), nasal and oral airflow, thoracic and abdominal wall motion, anterior tibialis EMG, snore microphone, electrocardiogram, and pulse oximetry. Bilevel positive airway pressure (BPAP) was initiated at the beginning of the study and titrated to treat sleep-disordered breathing.  MEDICATIONS Medications self-administered by patient taken the night of the study : NORCO, MELATONIN  RESPIRATORY PARAMETERS Optimal IPAP Pressure (cm): 22 AHI at Optimal Pressure (/hr) 0.0 Optimal EPAP Pressure (cm): 18   Overall Minimal O2 (%): 73.0 Minimal O2 at Optimal Pressure (%): 91.0 SLEEP ARCHITECTURE Start Time: 10:47:32 PM Stop Time: 4:53:52 AM Total Time (min): 366.3 Total Sleep Time (min): 338 Sleep Latency (min): 4.2 Sleep Efficiency (%): 92.3% REM Latency (min): 189.5 WASO (min): 24.2 Stage N1 (%): 5.6% Stage N2 (%): 78.1% Stage N3 (%): 0.0% Stage R (%): 16.3 Supine (%): 80.65 Arousal Index (/hr): 6.0    CARDIAC DATA The 2 lead EKG demonstrated sinus rhythm. The mean heart rate was 76.2 beats per minute. Other EKG findings include: PVCs.  LEG MOVEMENT DATA The total Periodic Limb Movements of Sleep (PLMS) were 0. The PLMS index was  0.0. A PLMS index of <15 is considered normal in adults.  IMPRESSIONS - CPAP did not provide adequate control at tolerated pressures and was converted to BILEVEL (BiPAP). - An optimal BiPAP pressure was selected for this patient ( 22/ 18 / cm of water) - Central sleep apnea was not noted during this titration (CAI = 0.0/h). - Severe oxygen desaturations were observed during this titration (min O2 = 73.0%). Supplemental O2 was provided at 3L per protocol. - Minimum saturation at BIPAP 22/18 with O2 3L was 91%. - No snoring was audible during this study. - 2-lead EKG demonstrated: PVCs - Clinically significant periodic limb movements were not noted during this study. Arousals associated with PLMs were rare.  DIAGNOSIS - Obstructive Sleep Apnea (327.23 [G47.33 ICD-10])  RECOMMENDATIONS - Trial of BiPAP therapy on 22/18 cm H2O with supplemental O2 at 3L bled in through BiPAP. - Patient used a Large size Fisher&Paykel Full Face Mask Simplus mask and heated humidification. - Be careful with alcohol, sedatives and other CNS depressants that may worsen sleep apnea and disrupt normal sleep architecture. - Sleep hygiene should be reviewed to assess factors that may improve sleep quality. - Weight management and regular exercise should be initiated or continued.  [Electronically signed] 02/06/2019 01:26 PM  Baird Lyons MD, [place], American Board of Sleep Medicine   NPI: [id number]                          [place], [place] of Sleep Medicine  ELECTRONICALLY SIGNED ON:  02/06/2019, 1:17 PM [place] PH: [phone]   FX: [phone] [place]  ACADEMY OF SLEEP MEDICINE

## 2019-02-26 ENCOUNTER — Encounter (HOSPITAL_COMMUNITY): Payer: Self-pay

## 2019-02-26 ENCOUNTER — Encounter (HOSPITAL_COMMUNITY)
Admission: RE | Admit: 2019-02-26 | Discharge: 2019-02-26 | Disposition: A | Discharge status: Home / Self Care | Payer: Medicare Other | Source: Ambulatory Visit | Attending: Internal Medicine | Admitting: Internal Medicine

## 2019-02-26 ENCOUNTER — Other Ambulatory Visit: Payer: Self-pay

## 2019-02-26 DIAGNOSIS — D473 Essential (hemorrhagic) thrombocythemia: Secondary | ICD-10-CM | POA: Insufficient documentation

## 2019-02-26 NOTE — Progress Notes (Signed)
Philip Powell presents today for phlebotomy per MD orders.  HGB/HCT:18.9  Phlebotomy procedure started at 0924 and ended at 0929 18.8 fl. oz. Removed.  Patient tolerated procedure well.  IV needle removed intact.

## 2019-03-02 ENCOUNTER — Other Ambulatory Visit: Payer: Self-pay | Admitting: *Deleted

## 2019-03-02 DIAGNOSIS — J9611 Chronic respiratory failure with hypoxia: Secondary | ICD-10-CM

## 2019-03-02 DIAGNOSIS — G4733 Obstructive sleep apnea (adult) (pediatric): Secondary | ICD-10-CM

## 2019-03-02 DIAGNOSIS — J9612 Chronic respiratory failure with hypercapnia: Secondary | ICD-10-CM

## 2019-03-02 NOTE — Progress Notes (Signed)
d 

## 2019-03-06 ENCOUNTER — Encounter: Payer: Self-pay | Admitting: Family Medicine

## 2019-03-25 ENCOUNTER — Encounter: Payer: Self-pay | Admitting: Family Medicine

## 2019-03-25 ENCOUNTER — Ambulatory Visit (INDEPENDENT_AMBULATORY_CARE_PROVIDER_SITE_OTHER): Payer: Medicare Other | Admitting: Family Medicine

## 2019-03-25 ENCOUNTER — Other Ambulatory Visit: Payer: Self-pay

## 2019-03-25 VITALS — BP 135/82 | HR 94 | Temp 98.2°F | Ht 72.0 in | Wt 324.0 lb

## 2019-03-25 DIAGNOSIS — Z79899 Other long term (current) drug therapy: Secondary | ICD-10-CM

## 2019-03-25 DIAGNOSIS — D751 Secondary polycythemia: Secondary | ICD-10-CM | POA: Diagnosis not present

## 2019-03-25 DIAGNOSIS — J9612 Chronic respiratory failure with hypercapnia: Secondary | ICD-10-CM

## 2019-03-25 DIAGNOSIS — M47812 Spondylosis without myelopathy or radiculopathy, cervical region: Secondary | ICD-10-CM | POA: Diagnosis not present

## 2019-03-25 DIAGNOSIS — G4733 Obstructive sleep apnea (adult) (pediatric): Secondary | ICD-10-CM

## 2019-03-25 DIAGNOSIS — J9611 Chronic respiratory failure with hypoxia: Secondary | ICD-10-CM

## 2019-03-25 DIAGNOSIS — Z7689 Persons encountering health services in other specified circumstances: Secondary | ICD-10-CM

## 2019-03-25 DIAGNOSIS — M180 Bilateral primary osteoarthritis of first carpometacarpal joints: Secondary | ICD-10-CM | POA: Diagnosis not present

## 2019-03-25 DIAGNOSIS — Z720 Tobacco use: Secondary | ICD-10-CM

## 2019-03-25 DIAGNOSIS — R7989 Other specified abnormal findings of blood chemistry: Secondary | ICD-10-CM

## 2019-03-25 DIAGNOSIS — Z1159 Encounter for screening for other viral diseases: Secondary | ICD-10-CM

## 2019-03-25 DIAGNOSIS — Z8639 Personal history of other endocrine, nutritional and metabolic disease: Secondary | ICD-10-CM

## 2019-03-25 LAB — BAYER DCA HB A1C WAIVED: HB A1C (BAYER DCA - WAIVED): 6.3 % (ref <7.0)

## 2019-03-25 NOTE — Progress Notes (Signed)
New Patient Office Visit  Assessment & Plan:  1. Polycythemia - CBC with Differential/Platelet  2-4. Primary osteoarthritis of both first carpometacarpal joints/Cervical spondylosis without myelopathy/Controlled substance agreement signed - Controlled substance agreement signed for Norco 7.5/325 mg TID PRN for pain. Patient has seen the specialists and had the work-up completed. He did opt not to proceed with surgery due to risks associated with it.  - CMP14+EGFR - Compliance Drug Analysis, Ur - HYDROcodone-acetaminophen (NORCO) 7.5-325 MG tablet; Take 1 tablet by mouth 3 (three) times daily as needed for moderate pain.  Dispense: 90 tablet; Refill: 0 - HYDROcodone-acetaminophen (NORCO) 7.5-325 MG tablet; Take 1 tablet by mouth 3 (three) times daily as needed for moderate pain.  Dispense: 90 tablet; Refill: 0 - HYDROcodone-acetaminophen (NORCO) 7.5-325 MG tablet; Take 1 tablet by mouth 3 (three) times daily as needed for moderate pain.  Dispense: 90 tablet; Refill: 0  5. Morbid obesity ([place]) - Diet and exercise encouraged.  - Lipid panel  6. Low testosterone - Testosterone,Free and Total  7. History of diabetes mellitus - Last A1c 6.5 in 2018; patient has never taken any medications and states nobody has ever told him that he has diabetes.  - Bayer DCA Hb A1c Waived  8. Chronic respiratory failure with hypoxia and hypercapnia ([place]) - Managed by pulmonologist. Discussed PFTs that he has scheduled in the near future.   9. OSA (obstructive sleep apnea) - Managed by pulmonologist. Does wear his CPAP.   10. Tobacco user - Encouraged smoking cessation. - Lipid panel  11. Encounter for hepatitis C screening test for low risk patient - Hepatitis C antibody  12. Encounter to establish care   Follow-up: Return in about 3 months (around 06/25/2019) for follow-up of chronic medication conditions.   Hendricks Limes, MSN, APRN, FNP-C [facility]  Subjective:   Patient ID: Philip Powell, male    DOB: 30-Nov-1950  Age: 68 y.o. MRN: [id number]  Patient Care Team: Loman Brooklyn, FNP as PCP - General (Family Medicine) Satira Sark, MD as PCP - Cardiology (Cardiology)  CC:  Chief Complaint  Patient presents with  . New Patient (Initial Visit)    HPI Philip Powell presents to establish care. Patient previous patient of Dr. Melina Copa, who has retired. Patient also is in need of a refill of his Norco if able.   Uses Albuterol once every other day. Patient states he does not have a diagnosis of COPD. He does have a pulmonologist and has PFTs scheduled in October. The pulmonologist managed his chronic respiratory filure and OSA. He does have a CPAP. He wears 3L of oxygen continuously.   Polycythemia: patient reports he has a CBC every 4-6 weeks. If his hgb was elevated, his previous PCP would send him for therapeutic phlebotomy of one unit.   Wife gives him testosterone injections every 2 weeks.   Patient takes Norco 7.5 mg due to pain in bilateral hands and neck. He has had multiple carpal tunnel releases. X-rays of hands (AP and lateral) reveal degenerative changes/arthritis. X-rays of the cervical spine reveal significant degenerative changes, foraminal narrowing, and joint space disk narrowing with osteophyte formation especially at C5-6. Patient has seen an orthopedics Dr. Fredna Dow for hand pain and Dr. Lorin Mercy for his neck pain. His nerve conduction studies were normal. The ultrasound that was performed of his wrists revealed an increased size of the nerve. The patient and Dr. Fredna Dow did discuss the possibility of a release of the carpal canal with  a hyperthenar fat pad transfer but there was significant risk associated with this surgery given the patient has already had two previous operations performed on the same nerve. After discussing risks, the patient opted not to continue with the surgery. He has previously failed therapy with Ibuprofen,  Naprosyn, and meloxicam. He was unable to afford Celebrex. He is currently taking the Norco as needed which tends to be 2-3 times a day.    Review of Systems  Constitutional: Negative for chills, fever, malaise/fatigue and weight loss.  HENT: Negative for congestion, ear discharge, ear pain, nosebleeds, sinus pain, sore throat and tinnitus.   Eyes: Negative for blurred vision, double vision, pain, discharge and redness.  Respiratory: Positive for shortness of breath. Negative for cough and wheezing.   Cardiovascular: Negative for chest pain, palpitations and leg swelling.  Gastrointestinal: Negative for abdominal pain, constipation, diarrhea, heartburn, nausea and vomiting.  Genitourinary: Negative for dysuria, frequency and urgency.  Musculoskeletal: Positive for back pain, joint pain and neck pain. Negative for myalgias.  Skin: Negative for rash.  Neurological: Negative for dizziness, seizures, weakness and headaches.  Psychiatric/Behavioral: Negative for depression, substance abuse and suicidal ideas. The patient is not nervous/anxious.      Current Outpatient Medications:  .  albuterol (VENTOLIN HFA) 108 (90 Base) MCG/ACT inhaler, Inhale 2 puffs into the lungs every 6 (six) hours as needed for wheezing or shortness of breath., Disp: 1 Inhaler, Rfl: 0 .  Ascorbic Acid (VITAMIN C PO), Take 1 tablet by mouth daily., Disp: , Rfl:  .  aspirin 325 MG tablet, Take 325 mg by mouth daily., Disp: , Rfl:  .  famotidine (PEPCID) 20 MG tablet, Take 20 mg by mouth daily., Disp: , Rfl:  .  furosemide (LASIX) 40 MG tablet, Take 0.5 tablets (20 mg total) by mouth daily., Disp: 30 tablet, Rfl: 0 .  HYDROcodone-acetaminophen (NORCO) 7.5-325 MG tablet, Take 1 tablet by mouth 3 (three) times daily as needed for moderate pain., Disp: 90 tablet, Rfl: 0 .  [START ON 04/26/2019] HYDROcodone-acetaminophen (NORCO) 7.5-325 MG tablet, Take 1 tablet by mouth 3 (three) times daily as needed for moderate pain., Disp: 90  tablet, Rfl: 0 .  [START ON 05/25/2019] HYDROcodone-acetaminophen (NORCO) 7.5-325 MG tablet, Take 1 tablet by mouth 3 (three) times daily as needed for moderate pain., Disp: 90 tablet, Rfl: 0 .  ibuprofen (ADVIL) 200 MG tablet, Take 400 mg by mouth every 6 (six) hours as needed for mild pain., Disp: , Rfl:  .  ipratropium-albuterol (DUONEB) 0.5-2.5 (3) MG/3ML SOLN, 1 neb every 4 hours if needed, Disp: 150 mL, Rfl: 12 .  loratadine (CLARITIN) 10 MG tablet, Take 10 mg by mouth daily as needed for allergies., Disp: , Rfl:  .  Multiple Vitamins-Minerals (CENTRUM ADULTS) TABS, Take 1 tablet by mouth daily., Disp: , Rfl:  .  testosterone cypionate (DEPOTESTOTERONE CYPIONATE) 100 MG/ML injection, Inject 100 mg into the muscle every 14 (fourteen) days. For IM use only , Disp: , Rfl:  .  Nebulizers (COMPRESSOR/NEBULIZER) MISC, Use as directed, Disp: 1 each, Rfl: 0  No Known Allergies  Past Medical History:  Diagnosis Date  . Chronic pain   . COPD (chronic obstructive pulmonary disease) ([place])   . Cor pulmonale, chronic ([place])   . Diabetes ([place])    A1c 6.5 on 01/17/17  . GERD (gastroesophageal reflux disease)   . History of pneumonia   . History of seizure   . Obesity   . Obstructive sleep apnea  CPAP  . Polycythemia     Past Surgical History:  Procedure Laterality Date  . CARPAL TUNNEL RELEASE Right 2008  . NEUROPLASTY / TRANSPOSITION MEDIAN NERVE AT CARPAL TUNNEL Bilateral   . VASECTOMY      Family History  Problem Relation Age of Onset  . Cancer Brother   . Heart disease Maternal Grandfather   . Heart attack Maternal Grandfather 55  . Cancer Mother   . Cancer Father   . Hypertension Son   . Cancer Sister   . Retinal detachment Brother   . Retinal detachment Brother   . Lupus Sister     Social History   Socioeconomic History  . Marital status: Married    Spouse name: Not on file  . Number of children: 3  . Years of education: Not on file  . Highest education level: Not  on file  Occupational History  . Occupation: lpn  Social Needs  . Financial resource strain: Not on file  . Food insecurity    Worry: Not on file    Inability: Not on file  . Transportation needs    Medical: Not on file    Non-medical: Not on file  Tobacco Use  . Smoking status: Current Every Day Smoker    Packs/day: 1.00    Years: 43.00    Pack years: 43.00    Types: Cigarettes  . Smokeless tobacco: Never Used  . Tobacco comment: 0.5  Substance and Sexual Activity  . Alcohol use: Yes    Alcohol/week: 0.0 standard drinks    Comment: occasionally  . Drug use: No  . Sexual activity: Not Currently  Lifestyle  . Physical activity    Days per week: Not on file    Minutes per session: Not on file  . Stress: Not on file  Relationships  . Social Herbalist on phone: Not on file    Gets together: Not on file    Attends religious service: Not on file    Active member of club or organization: Not on file    Attends meetings of clubs or organizations: Not on file    Relationship status: Not on file  . Intimate partner violence    Fear of current or ex partner: Not on file    Emotionally abused: Not on file    Physically abused: Not on file    Forced sexual activity: Not on file  Other Topics Concern  . Not on file  Social History Narrative  . Not on file    Objective:   Today's Vitals: BP 135/82   Pulse 94   Temp 98.2 F (36.8 C) (Temporal)   Ht 6' (1.829 m)   Wt (!) 324 lb (147 kg)   BMI 43.94 kg/m   Physical Exam Vitals signs reviewed.  Constitutional:      General: He is not in acute distress.    Appearance: Normal appearance. He is morbidly obese. He is not ill-appearing, toxic-appearing or diaphoretic.  HENT:     Head: Normocephalic and atraumatic.  Eyes:     General: No scleral icterus.       Right eye: No discharge.        Left eye: No discharge.     Conjunctiva/sclera: Conjunctivae normal.  Neck:     Musculoskeletal: Normal range of  motion.  Cardiovascular:     Rate and Rhythm: Normal rate and regular rhythm.     Heart sounds: Normal heart sounds. No murmur. No  friction rub. No gallop.   Pulmonary:     Effort: Pulmonary effort is normal. No respiratory distress.     Breath sounds: Normal breath sounds. No stridor. No wheezing, rhonchi or rales.  Musculoskeletal: Normal range of motion.  Skin:    General: Skin is warm and dry.  Neurological:     Mental Status: He is alert and oriented to person, place, and time. Mental status is at baseline.  Psychiatric:        Mood and Affect: Mood normal.        Behavior: Behavior normal.        Thought Content: Thought content normal.        Judgment: Judgment normal.

## 2019-03-26 MED ORDER — HYDROCODONE-ACETAMINOPHEN 7.5-325 MG PO TABS: 1.0000 | ORAL_TABLET | Freq: Three times a day (TID) | ORAL | 0 refills | Status: DC | PRN

## 2019-03-27 LAB — CBC WITH DIFFERENTIAL/PLATELET
Basophils Absolute: 0.1 10*3/uL (ref 0.0–0.2)
Basos: 1 %
EOS (ABSOLUTE): 0.2 10*3/uL (ref 0.0–0.4)
Eos: 2 %
Hematocrit: 57.7 % — ABNORMAL HIGH (ref 37.5–51.0)
Hemoglobin: 17.9 g/dL — ABNORMAL HIGH (ref 13.0–17.7)
Immature Grans (Abs): 0 10*3/uL (ref 0.0–0.1)
Immature Granulocytes: 0 %
Lymphocytes Absolute: 2 10*3/uL (ref 0.7–3.1)
Lymphs: 23 %
MCH: 25.5 pg — ABNORMAL LOW (ref 26.6–33.0)
MCHC: 31 g/dL — ABNORMAL LOW (ref 31.5–35.7)
MCV: 82 fL (ref 79–97)
Monocytes Absolute: 1.1 10*3/uL — ABNORMAL HIGH (ref 0.1–0.9)
Monocytes: 13 %
Neutrophils Absolute: 5.4 10*3/uL (ref 1.4–7.0)
Neutrophils: 61 %
Platelets: 118 10*3/uL — ABNORMAL LOW (ref 150–450)
RBC: 7.03 x10E6/uL (ref 4.14–5.80)
RDW: 23 % — ABNORMAL HIGH (ref 11.6–15.4)
WBC: 8.7 10*3/uL (ref 3.4–10.8)

## 2019-03-27 LAB — CMP14+EGFR
ALT: 38 IU/L (ref 0–44)
AST: 47 IU/L — ABNORMAL HIGH (ref 0–40)
Albumin/Globulin Ratio: 2 (ref 1.2–2.2)
Albumin: 4.7 g/dL (ref 3.8–4.8)
Alkaline Phosphatase: 55 IU/L (ref 39–117)
BUN/Creatinine Ratio: 18 (ref 10–24)
BUN: 13 mg/dL (ref 8–27)
Bilirubin Total: 1.3 mg/dL — ABNORMAL HIGH (ref 0.0–1.2)
CO2: 28 mmol/L (ref 20–29)
Calcium: 9.8 mg/dL (ref 8.6–10.2)
Chloride: 97 mmol/L (ref 96–106)
Creatinine, Ser: 0.74 mg/dL — ABNORMAL LOW (ref 0.76–1.27)
GFR calc Af Amer: 112 mL/min/{1.73_m2} (ref >59)
GFR calc non Af Amer: 97 mL/min/{1.73_m2} (ref >59)
Globulin, Total: 2.4 g/dL (ref 1.5–4.5)
Glucose: 84 mg/dL (ref 65–99)
Potassium: 4.8 mmol/L (ref 3.5–5.2)
Sodium: 141 mmol/L (ref 134–144)
Total Protein: 7.1 g/dL (ref 6.0–8.5)

## 2019-03-27 LAB — LIPID PANEL
Chol/HDL Ratio: 7.6 ratio — ABNORMAL HIGH (ref 0.0–5.0)
Cholesterol, Total: 183 mg/dL (ref 100–199)
HDL: 24 mg/dL — ABNORMAL LOW (ref >39)
LDL Calculated: 108 mg/dL — ABNORMAL HIGH (ref 0–99)
Triglycerides: 253 mg/dL — ABNORMAL HIGH (ref 0–149)
VLDL Cholesterol Cal: 51 mg/dL — ABNORMAL HIGH (ref 5–40)

## 2019-03-27 LAB — TESTOSTERONE,FREE AND TOTAL
Testosterone, Free: 9.7 pg/mL (ref 6.6–18.1)
Testosterone: 435 ng/dL (ref 264–916)

## 2019-03-27 LAB — HEPATITIS C ANTIBODY: Hep C Virus Ab: <0.1 s/co ratio (ref 0.0–0.9)

## 2019-03-30 ENCOUNTER — Other Ambulatory Visit: Payer: Self-pay | Admitting: Family Medicine

## 2019-03-30 ENCOUNTER — Encounter: Payer: Self-pay | Admitting: Family Medicine

## 2019-03-30 DIAGNOSIS — D751 Secondary polycythemia: Secondary | ICD-10-CM

## 2019-03-30 DIAGNOSIS — E782 Mixed hyperlipidemia: Secondary | ICD-10-CM

## 2019-03-30 LAB — COMPLIANCE DRUG ANALYSIS, UR

## 2019-03-30 MED ORDER — ATORVASTATIN CALCIUM 10 MG PO TABS: 10.0000 mg | ORAL_TABLET | Freq: Every day | ORAL | 2 refills | Status: DC

## 2019-03-31 ENCOUNTER — Telehealth: Payer: Self-pay | Admitting: Family Medicine

## 2019-03-31 NOTE — Telephone Encounter (Signed)
After speaking with my supervising physician it was determined his polycythemia needs to be managed by a hematologist and not me. He has not seen Dr. Julien Nordmann since 2016. I have already placed that as an urgent referral.

## 2019-03-31 NOTE — Telephone Encounter (Signed)
Patient has appointment 04/08/19 with hematologist.  He feels he needs a therapeutic blood draw before then and wants to see what you think and where he could go or what he could do.

## 2019-03-31 NOTE — Telephone Encounter (Signed)
Please let patient know I am ordering the therapeutic phlebotomy x1 unit. I have tried to call over this afternoon and they are already gone so it will be morning before we can get the order to them. They will call him with a time to come.

## 2019-03-31 NOTE — Telephone Encounter (Signed)
Aware to call [place] [facility] (urgent referral notification), to schedule .

## 2019-03-31 NOTE — Telephone Encounter (Signed)
Aware of results.  He already goes to Dr.Muhammed for his blood disorder.  He would like for you to order a blood draw at [facility] so he will not get extremly sick again

## 2019-03-31 NOTE — Telephone Encounter (Signed)
Patient aware and verbalizes understanding. 

## 2019-03-31 NOTE — Telephone Encounter (Signed)
Patient is calling back asking to speak with nurse about his lab work. Would not give anymore information. Please advise.

## 2019-04-01 ENCOUNTER — Other Ambulatory Visit: Payer: Self-pay | Admitting: *Deleted

## 2019-04-01 DIAGNOSIS — D751 Secondary polycythemia: Secondary | ICD-10-CM

## 2019-04-02 ENCOUNTER — Encounter (HOSPITAL_COMMUNITY)
Admission: RE | Admit: 2019-04-02 | Discharge: 2019-04-02 | Disposition: A | Discharge status: Home / Self Care | Payer: Medicare Other | Source: Ambulatory Visit | Attending: Family Medicine | Admitting: Family Medicine

## 2019-04-02 ENCOUNTER — Other Ambulatory Visit: Payer: Self-pay

## 2019-04-02 DIAGNOSIS — D473 Essential (hemorrhagic) thrombocythemia: Secondary | ICD-10-CM | POA: Diagnosis present

## 2019-04-02 NOTE — Progress Notes (Signed)
Philip Powell presents today for phlebotomy per MD orders. HGB/HCT:17.9/57.7 Phlebotomy procedure started at 1109 and ended at 1120. 18.4 oz removed. Patient tolerated procedure well. IV needle removed intact. drsg to site. Refused po fluids. VSS. Refuses to wait 30 minutes post procedure. States "I feel fine."  D/C to home in good condition.

## 2019-04-08 ENCOUNTER — Inpatient Hospital Stay (HOSPITAL_COMMUNITY): Payer: Medicare Other

## 2019-04-08 ENCOUNTER — Encounter (HOSPITAL_COMMUNITY): Payer: Self-pay | Admitting: Hematology

## 2019-04-08 ENCOUNTER — Inpatient Hospital Stay (HOSPITAL_COMMUNITY): Payer: Medicare Other | Attending: Hematology | Admitting: Hematology

## 2019-04-08 ENCOUNTER — Other Ambulatory Visit: Payer: Self-pay

## 2019-04-08 ENCOUNTER — Encounter (HOSPITAL_COMMUNITY): Payer: Self-pay | Admitting: *Deleted

## 2019-04-08 VITALS — BP 141/86 | HR 111 | Temp 97.1°F | Resp 20 | Wt 319.8 lb

## 2019-04-08 DIAGNOSIS — E119 Type 2 diabetes mellitus without complications: Secondary | ICD-10-CM | POA: Diagnosis not present

## 2019-04-08 DIAGNOSIS — D696 Thrombocytopenia, unspecified: Secondary | ICD-10-CM | POA: Insufficient documentation

## 2019-04-08 DIAGNOSIS — J449 Chronic obstructive pulmonary disease, unspecified: Secondary | ICD-10-CM | POA: Diagnosis not present

## 2019-04-08 DIAGNOSIS — Z8669 Personal history of other diseases of the nervous system and sense organs: Secondary | ICD-10-CM | POA: Insufficient documentation

## 2019-04-08 DIAGNOSIS — D751 Secondary polycythemia: Secondary | ICD-10-CM

## 2019-04-08 DIAGNOSIS — E669 Obesity, unspecified: Secondary | ICD-10-CM | POA: Diagnosis not present

## 2019-04-08 DIAGNOSIS — Z79899 Other long term (current) drug therapy: Secondary | ICD-10-CM | POA: Diagnosis not present

## 2019-04-08 DIAGNOSIS — G8929 Other chronic pain: Secondary | ICD-10-CM | POA: Diagnosis not present

## 2019-04-08 DIAGNOSIS — R319 Hematuria, unspecified: Secondary | ICD-10-CM

## 2019-04-08 DIAGNOSIS — Z809 Family history of malignant neoplasm, unspecified: Secondary | ICD-10-CM | POA: Insufficient documentation

## 2019-04-08 DIAGNOSIS — G4730 Sleep apnea, unspecified: Secondary | ICD-10-CM | POA: Diagnosis not present

## 2019-04-08 DIAGNOSIS — K219 Gastro-esophageal reflux disease without esophagitis: Secondary | ICD-10-CM | POA: Diagnosis not present

## 2019-04-08 DIAGNOSIS — F17210 Nicotine dependence, cigarettes, uncomplicated: Secondary | ICD-10-CM | POA: Diagnosis not present

## 2019-04-08 LAB — CBC WITH DIFFERENTIAL/PLATELET
Abs Immature Granulocytes: 0.02 10*3/uL (ref 0.00–0.07)
Basophils Absolute: 0.1 10*3/uL (ref 0.0–0.1)
Basophils Relative: 1 %
Eosinophils Absolute: 0.2 10*3/uL (ref 0.0–0.5)
Eosinophils Relative: 2 %
HCT: 58.2 % — ABNORMAL HIGH (ref 39.0–52.0)
Hemoglobin: 17.3 g/dL — ABNORMAL HIGH (ref 13.0–17.0)
Immature Granulocytes: 0 %
Lymphocytes Relative: 13 %
Lymphs Abs: 1.1 10*3/uL (ref 0.7–4.0)
MCH: 26 pg (ref 26.0–34.0)
MCHC: 29.7 g/dL — ABNORMAL LOW (ref 30.0–36.0)
MCV: 87.4 fL (ref 80.0–100.0)
Monocytes Absolute: 1 10*3/uL (ref 0.1–1.0)
Monocytes Relative: 12 %
Neutro Abs: 6 10*3/uL (ref 1.7–7.7)
Neutrophils Relative %: 72 %
Platelets: 148 10*3/uL — ABNORMAL LOW (ref 150–400)
RBC: 6.66 MIL/uL — ABNORMAL HIGH (ref 4.22–5.81)
RDW: 21.7 % — ABNORMAL HIGH (ref 11.5–15.5)
WBC: 8.3 10*3/uL (ref 4.0–10.5)
nRBC: 0 % (ref 0.0–0.2)

## 2019-04-08 LAB — URINALYSIS, ROUTINE W REFLEX MICROSCOPIC
Bacteria, UA: NONE SEEN
Bilirubin Urine: NEGATIVE
Glucose, UA: NEGATIVE mg/dL
Hgb urine dipstick: NEGATIVE
Ketones, ur: NEGATIVE mg/dL
Leukocytes,Ua: NEGATIVE
Nitrite: NEGATIVE
Protein, ur: 30 mg/dL — AB
Specific Gravity, Urine: 1.016 (ref 1.005–1.030)
pH: 6 (ref 5.0–8.0)

## 2019-04-08 LAB — CARBOXYHEMOGLOBIN - COOX: Carboxyhemoglobin: 6.5 % (ref 0.5–1.5)

## 2019-04-08 LAB — LACTATE DEHYDROGENASE: LDH: 135 U/L (ref 98–192)

## 2019-04-08 NOTE — Progress Notes (Signed)
CONSULT NOTE  Patient Care Team: Loman Brooklyn, FNP as PCP - General (Family Medicine) Satira Sark, MD as Consulting Physician (Cardiology) Deneise Lever, MD as Consulting Physician (Pulmonary Disease)  CHIEF COMPLAINTS/PURPOSE OF CONSULTATION:  Polycythemia.  HISTORY OF PRESENTING ILLNESS:  Philip Powell 68 y.o. male is seen at the request of Delight Ovens, [place] for further work-up and management of polycythemia.  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  He was initially evaluated in March 2016 by Dr. Julien Nordmann.  Since then he has been receiving intermittent phlebotomies almost every 6 to 8 weeks.  He reports that he feels his chest is full and feels off in the head when he requires phlebotomy.  He had a phlebotomy last week when 18 ounces of blood was removed.  He denies any vasomotor symptoms or erythromelalgia's.  No aquagenic pruritus.  No prior history of thrombosis.  He does have sleep apnea and uses CPAP machine.  He is also on continuous oxygen for his COPD.  He is a current active smoker, 1 pack/day for 50 years.  He is also on testosterone supplements 100 mg every 2 weeks for few years.  He also takes Lasix 40 mg as needed, 1 shot twice per week.  No prior history of malignancies.  He is a retired Corporate treasurer.  Denies any fevers, night sweats or weight loss.  Denies any easy bleeding or bruising.  Family history significant for mother with kidney cancer and father with lymphoma.  Brother had myeloma and another brother had lymphoma.  Denies any recurrent infections or hospitalizations.  Appetite is 100% and energy levels are 75%.    MEDICAL HISTORY:  Past Medical History:  Diagnosis Date  . Chronic pain   . COPD (chronic obstructive pulmonary disease) ([place])   . Cor pulmonale, chronic ([place])   . GERD (gastroesophageal reflux disease)   . History of diabetes mellitus    A1c 6.5 on 01/17/17; 6.3 on 03/25/2019  . History of pneumonia   . History of seizure   . Obesity   .  Obstructive sleep apnea    CPAP  . Polycythemia     SURGICAL HISTORY: Past Surgical History:  Procedure Laterality Date  . CARPAL TUNNEL RELEASE Right 2008  . NEUROPLASTY / TRANSPOSITION MEDIAN NERVE AT CARPAL TUNNEL Bilateral   . VASECTOMY      SOCIAL HISTORY: Social History   Socioeconomic History  . Marital status: Married    Spouse name: Not on file  . Number of children: 3  . Years of education: Not on file  . Highest education level: Not on file  Occupational History  . Occupation: lpn    Comment: retired  Scientific laboratory technician  . Financial resource strain: Not on file  . Food insecurity    Worry: Not on file    Inability: Not on file  . Transportation needs    Medical: Not on file    Non-medical: Not on file  Tobacco Use  . Smoking status: Current Every Day Smoker    Packs/day: 1.00    Years: 43.00    Pack years: 43.00    Types: Cigarettes  . Smokeless tobacco: Never Used  . Tobacco comment: 0.5  Substance and Sexual Activity  . Alcohol use: Yes    Alcohol/week: 0.0 standard drinks    Comment: occasionally  . Drug use: No  . Sexual activity: Not Currently  Lifestyle  . Physical activity    Days per week: Not on file  Minutes per session: Not on file  . Stress: Not on file  Relationships  . Social Herbalist on phone: Not on file    Gets together: Not on file    Attends religious service: Not on file    Active member of club or organization: Not on file    Attends meetings of clubs or organizations: Not on file    Relationship status: Not on file  . Intimate partner violence    Fear of current or ex partner: Not on file    Emotionally abused: Not on file    Physically abused: Not on file    Forced sexual activity: Not on file  Other Topics Concern  . Not on file  Social History Narrative  . Not on file    FAMILY HISTORY: Family History  Problem Relation Age of Onset  . Cancer Brother   . Heart disease Maternal Grandfather   . Heart  attack Maternal Grandfather 55  . Cancer Mother   . Cancer Father   . Hypertension Son   . Cancer Sister   . Cancer Brother   . Lupus Sister     ALLERGIES:  has No Known Allergies.  MEDICATIONS:  Current Outpatient Medications  Medication Sig Dispense Refill  . Ascorbic Acid (VITAMIN C PO) Take 1 tablet by mouth daily.    Marland Kitchen aspirin 325 MG tablet Take 325 mg by mouth daily.    . famotidine (PEPCID) 20 MG tablet Take 20 mg by mouth daily.    Marland Kitchen HYDROcodone-acetaminophen (NORCO) 7.5-325 MG tablet Take 1 tablet by mouth 3 (three) times daily as needed for moderate pain. 90 tablet 0  . [START ON 04/26/2019] HYDROcodone-acetaminophen (NORCO) 7.5-325 MG tablet Take 1 tablet by mouth 3 (three) times daily as needed for moderate pain. 90 tablet 0  . [START ON 05/25/2019] HYDROcodone-acetaminophen (NORCO) 7.5-325 MG tablet Take 1 tablet by mouth 3 (three) times daily as needed for moderate pain. 90 tablet 0  . loratadine (CLARITIN) 10 MG tablet Take 10 mg by mouth daily as needed for allergies.    . Multiple Vitamins-Minerals (CENTRUM ADULTS) TABS Take 1 tablet by mouth daily.    . OXYGEN Inhale 3 L into the lungs continuous.    Marland Kitchen testosterone cypionate (DEPOTESTOTERONE CYPIONATE) 100 MG/ML injection Inject 100 mg into the muscle every 14 (fourteen) days. For IM use only     . albuterol (VENTOLIN HFA) 108 (90 Base) MCG/ACT inhaler Inhale 2 puffs into the lungs every 6 (six) hours as needed for wheezing or shortness of breath. (Patient not taking: Reported on 04/08/2019) 1 Inhaler 0  . furosemide (LASIX) 40 MG tablet Take 0.5 tablets (20 mg total) by mouth daily. (Patient not taking: Reported on 04/08/2019) 30 tablet 0  . ibuprofen (ADVIL) 200 MG tablet Take 400 mg by mouth every 6 (six) hours as needed for mild pain.     No current facility-administered medications for this visit.     REVIEW OF SYSTEMS:   Constitutional: Denies fevers, chills or abnormal night sweats Eyes: Denies blurriness of  vision, double vision or watery eyes Ears, nose, mouth, throat, and face: Denies mucositis or sore throat Respiratory: Dyspnea on exertion present. Cardiovascular: Denies palpitation, chest discomfort or lower extremity swelling Gastrointestinal:  Denies nausea, heartburn or change in bowel habits Skin: Denies abnormal skin rashes Lymphatics: Denies new lymphadenopathy or easy bruising Neurological:Denies numbness, tingling or new weaknesses Behavioral/Psych: Mood is stable, no new changes  All other systems were  reviewed with the patient and are negative.  PHYSICAL EXAMINATION: ECOG PERFORMANCE STATUS: 1 - Symptomatic but completely ambulatory  Vitals:   04/08/19 1050  BP: (!) 141/86  Pulse: (!) 111  Resp: 20  Temp: (!) 97.1 F (36.2 C)  SpO2: 94%   Filed Weights   04/08/19 1050  Weight: (!) 319 lb 12.8 oz (145.1 kg)    GENERAL:alert, no distress and comfortable SKIN: skin color, texture, turgor are normal, no rashes or significant lesions EYES: normal, conjunctiva are pink and non-injected, sclera clear OROPHARYNX:no exudate, no erythema and lips, buccal mucosa, and tongue normal  NECK: supple, thyroid normal size, non-tender, without nodularity LYMPH:  no palpable lymphadenopathy in the cervical, axillary or inguinal LUNGS: clear to auscultation and percussion with normal breathing effort HEART: regular rate & rhythm and no murmurs.  1+ edema bilaterally. ABDOMEN:abdomen soft, non-tender and normal bowel sounds Musculoskeletal:no cyanosis of digits and no clubbing  PSYCH: alert & oriented x 3 with fluent speech NEURO: no focal motor/sensory deficits  LABORATORY DATA:  I have reviewed the data as listed Recent Results (from the past 2160 hour(s))  Hemoglobin-hemacue, POC     Status: Abnormal   Collection Time: 01/15/19  1:37 PM  Result Value Ref Range   Hemoglobin 18.5 (H) 13.0 - 17.0 g/dL  SARS Coronavirus 2 (Performed in [place] [facility] lab)     Status: None    Collection Time: 01/25/19  1:42 PM   Specimen: Nasal Swab  Result Value Ref Range   SARS Coronavirus 2 NEGATIVE NEGATIVE    Comment: (NOTE) SARS-CoV-2 target nucleic acids are NOT DETECTED. The SARS-CoV-2 RNA is generally detectable in upper and lower respiratory specimens during the acute phase of infection. Negative results do not preclude SARS-CoV-2 infection, do not rule out co-infections with other pathogens, and should not be used as the sole basis for treatment or other patient management decisions. Negative results must be combined with clinical observations, patient history, and epidemiological information. The expected result is Negative. Fact Sheet for Patients: SugarRoll.be Fact Sheet for Healthcare Providers: https://www.woods-mathews.com/ This test is not yet approved or cleared by the [place] FDA and  has been authorized for detection and/or diagnosis of SARS-CoV-2 by FDA under an Emergency Use Authorization (EUA). This EUA will remain  in effect (meaning this test can be used) for the duration of the COVID-19 declaration under Section 56 4(b)(1) of the Act, 21 U.S.C. section 360bbb-3(b)(1), unless the authorization is terminated or revoked sooner. Performed at [facility], [place] [address], [place], [place] [address]   [facility] Hb A1c Waived     Status: None   Collection Time: 03/25/19  2:16 PM  Result Value Ref Range   HB A1C (BAYER DCA - WAIVED) 6.3 <7.0 %    Comment:                                       Diabetic Adult            <7.0                                       Healthy Adult        4.3 - 5.7                                                           (  DCCT/NGSP) American Diabetes Association's Summary of Glycemic Recommendations for Adults with Diabetes: Hemoglobin A1c <7.0%. More stringent glycemic goals (A1c <6.0%) may further reduce complications at the cost of increased risk of  hypoglycemia.   CBC with Differential/Platelet     Status: Abnormal   Collection Time: 03/25/19  2:22 PM  Result Value Ref Range   WBC 8.7 3.4 - 10.8 x10E3/uL   RBC 7.03 (HH) 4.14 - 5.80 x10E6/uL    Comment: Removal of plasma from EDTA tube will result in spuriously elevated CBC results. Redraw and repeat if results do not correlate with patient's clinical condition.    Hemoglobin 17.9 (H) 13.0 - 17.7 g/dL   Hematocrit 57.7 (H) 37.5 - 51.0 %   MCV 82 79 - 97 fL   MCH 25.5 (L) 26.6 - 33.0 pg   MCHC 31.0 (L) 31.5 - 35.7 g/dL   RDW 23.0 (H) 11.6 - 15.4 %   Platelets 118 (L) 150 - 450 x10E3/uL   Neutrophils 61 Not Estab. %   Lymphs 23 Not Estab. %   Monocytes 13 Not Estab. %   Eos 2 Not Estab. %   Basos 1 Not Estab. %   Neutrophils Absolute 5.4 1.4 - 7.0 x10E3/uL   Lymphocytes Absolute 2.0 0.7 - 3.1 x10E3/uL   Monocytes Absolute 1.1 (H) 0.1 - 0.9 x10E3/uL   EOS (ABSOLUTE) 0.2 0.0 - 0.4 x10E3/uL   Basophils Absolute 0.1 0.0 - 0.2 x10E3/uL   Immature Granulocytes 0 Not Estab. %   Immature Grans (Abs) 0.0 0.0 - 0.1 x10E3/uL   Hematology Comments: Note:     Comment: Verified by microscopic examination.  CMP14+EGFR     Status: Abnormal   Collection Time: 03/25/19  2:22 PM  Result Value Ref Range   Glucose 84 65 - 99 mg/dL   BUN 13 8 - 27 mg/dL   Creatinine, Ser 0.74 (L) 0.76 - 1.27 mg/dL   GFR calc non Af Amer 97 >59 mL/min/1.73   GFR calc Af Amer 112 >59 mL/min/1.73   BUN/Creatinine Ratio 18 10 - 24   Sodium 141 134 - 144 mmol/L   Potassium 4.8 3.5 - 5.2 mmol/L   Chloride 97 96 - 106 mmol/L   CO2 28 20 - 29 mmol/L   Calcium 9.8 8.6 - 10.2 mg/dL   Total Protein 7.1 6.0 - 8.5 g/dL   Albumin 4.7 3.8 - 4.8 g/dL   Globulin, Total 2.4 1.5 - 4.5 g/dL   Albumin/Globulin Ratio 2.0 1.2 - 2.2   Bilirubin Total 1.3 (H) 0.0 - 1.2 mg/dL   Alkaline Phosphatase 55 39 - 117 IU/L   AST 47 (H) 0 - 40 IU/L   ALT 38 0 - 44 IU/L  Hepatitis C antibody     Status: None   Collection Time:  03/25/19  2:22 PM  Result Value Ref Range   Hep C Virus Ab <0.1 0.0 - 0.9 s/co ratio    Comment:                                   Negative:     < 0.8                              Indeterminate: 0.8 - 0.9  Positive:     > 0.9  The CDC recommends that a positive HCV antibody result  be followed up with a HCV Nucleic Acid Amplification  test (979480).   Lipid panel     Status: Abnormal   Collection Time: 03/25/19  2:22 PM  Result Value Ref Range   Cholesterol, Total 183 100 - 199 mg/dL   Triglycerides 253 (H) 0 - 149 mg/dL   HDL 24 (L) >39 mg/dL   VLDL Cholesterol Cal 51 (H) 5 - 40 mg/dL   LDL Calculated 108 (H) 0 - 99 mg/dL    Comment: **Effective March 29, 2019, [facility] is implementing an improved** equation to calculate Low Density Lipoprotein Cholesterol (LDL-C) concentrations, to be used in all lipid panels that report calculated LDL-C. This equation was developed through a collaboration with the [employer], Lung and [place] (NIH).[1] The NIH calculation overcomes the limitations of the existing [place] LDL-C equation and performs equally well in both fasting and non-fasting individuals. 1. Pauline Good Q, et al. A new equation for calculation of low-density lipoprotein cholesterol in patients with normolipidemia and/or hypertriglyceridemia. JAMA Cardiol. 2020 Feb 26. doi:10.1001/jamacardio.2020.0013    Chol/HDL Ratio 7.6 (H) 0.0 - 5.0 ratio    Comment:                                   T. Chol/HDL Ratio                                             Men  Women                               1/2 Avg.Risk  3.4    3.3                                   Avg.Risk  5.0    4.4                                2X Avg.Risk  9.6    7.1                                3X Avg.Risk 23.4   11.0   Testosterone,Free and Total     Status: None   Collection Time: 03/25/19  2:22 PM  Result Value Ref Range   Testosterone 435 264  - 916 ng/dL    Comment: Adult male reference interval is based on a population of healthy nonobese males (BMI <30) between 70 and 50 years old. [place], [place] [phone]. PMID: 54492010.    Testosterone, Free 9.7 6.6 - 18.1 pg/mL  Compliance Drug Analysis, Ur     Status: None   Collection Time: 03/25/19  2:23 PM  Result Value Ref Range   Summary Comment     Comment: Note ==================================================================== Compliance Drug Analysis, Ur ==================================================================== Test                             Result  Flag       Units Drug Present   Hydrocodone                    817                     ng/mg creat   Hydromorphone                  106                     ng/mg creat   Dihydrocodeine                 262                     ng/mg creat   Norhydrocodone                 1262                    ng/mg creat    Sources of hydrocodone include scheduled prescription medications.    Hydromorphone, dihydrocodeine and norhydrocodone are expected    metabolites of hydrocodone. Hydromorphone and dihydrocodeine are    also available as scheduled prescription medications.   Acetaminophen                  PRESENT   Ibuprofen                      PRESENT ==================================================================== Test                      Result    Flag   Units       Ref Range   Creatinine              87               mg/dL      >=20 ==================================================================== Declared Medications:  Medication list was not provided. ==================================================================== For clinical consultation, please call [phone]. ====================================================================   Urinalysis, Routine w reflex microscopic     Status: Abnormal   Collection Time: 04/08/19 11:27 AM  Result Value Ref Range   Color, Urine YELLOW  YELLOW   APPearance CLEAR CLEAR   Specific Gravity, Urine 1.016 1.005 - 1.030   pH 6.0 5.0 - 8.0   Glucose, UA NEGATIVE NEGATIVE mg/dL   Hgb urine dipstick NEGATIVE NEGATIVE   Bilirubin Urine NEGATIVE NEGATIVE   Ketones, ur NEGATIVE NEGATIVE mg/dL   Protein, ur 30 (A) NEGATIVE mg/dL   Nitrite NEGATIVE NEGATIVE   Leukocytes,Ua NEGATIVE NEGATIVE   RBC / HPF 0-5 0 - 5 RBC/hpf   WBC, UA 0-5 0 - 5 WBC/hpf   Bacteria, UA NONE SEEN NONE SEEN   Mucus PRESENT     Comment: Performed at [facility], [address] [place], [place] [address]  Carboxyhemoglobin (single result)     Status: Abnormal   Collection Time: 04/08/19 11:38 AM  Result Value Ref Range   Carboxyhemoglobin 6.5 (HH) 0.5 - 1.5 %    Comment: CRITICAL RESULT CALLED TO, READ BACK BY AND VERIFIED WITH: ANDERSON,A AT 12:10PM ON 04/08/19 BY [facility] Performed at [facility], [address], [place], [place] [address]   CBC with Differential     Status: Abnormal   Collection Time: 04/08/19 11:38 AM  Result Value Ref Range   WBC 8.3 4.0 - 10.5 K/uL  RBC 6.66 (H) 4.22 - 5.81 MIL/uL   Hemoglobin 17.3 (H) 13.0 - 17.0 g/dL   HCT 58.2 (H) 39.0 - 52.0 %   MCV 87.4 80.0 - 100.0 fL   MCH 26.0 26.0 - 34.0 pg   MCHC 29.7 (L) 30.0 - 36.0 g/dL   RDW 21.7 (H) 11.5 - 15.5 %   Platelets 148 (L) 150 - 400 K/uL   nRBC 0.0 0.0 - 0.2 %   Neutrophils Relative % 72 %   Neutro Abs 6.0 1.7 - 7.7 K/uL   Lymphocytes Relative 13 %   Lymphs Abs 1.1 0.7 - 4.0 K/uL   Monocytes Relative 12 %   Monocytes Absolute 1.0 0.1 - 1.0 K/uL   Eosinophils Relative 2 %   Eosinophils Absolute 0.2 0.0 - 0.5 K/uL   Basophils Relative 1 %   Basophils Absolute 0.1 0.0 - 0.1 K/uL   Immature Granulocytes 0 %   Abs Immature Granulocytes 0.02 0.00 - 0.07 K/uL    Comment: Performed at [facility], [address] [place], [place] [address]  Lactate dehydrogenase     Status: None   Collection Time: 04/08/19 11:38 AM  Result Value Ref Range   LDH 135 98 - 192 U/L     Comment: Performed at [facility], [address] [place], [place] [address]    RADIOGRAPHIC STUDIES: I have personally reviewed the radiological images as listed and agreed with the findings in the report.  ASSESSMENT & PLAN:  Polycythemia 1.  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 Julien Nordmann in March 2016 for polycythemia. -Erythropoietin was 8 and Jak 2 V617 of test was negative. - Etiology includes sleep apnea, testosterone supplements and Lasix.  He is also current active smoker, 1 pack/day for 50 years. -He usually gets chest fullness and feels off in the head when he is hematocrit goes up and it is time for phlebotomy. - He was phlebotomized on 04/02/2019 with 18 ounces taken out, arranged by his PMD. - Denies any fevers, night sweats or weight loss. -CT abdomen in April 2018 showed normal spleen. -Denies any vasomotor symptoms or aquagenic pruritus.  No prior history of thrombosis. - We will check his CBC again today.  We will aim for hematocrit around 55 to 58%. - Will send for other Jak 2 mutations, CALR and MPL stations.  We will also repeat erythropoietin level.  Will check UA for hematuria.  We will also check carboxyhemoglobin level. -he will be seen back in 2 to 3 weeks for follow-up.  2.  Tobacco abuse: - He has smoked 1 pack/day for 50 years. -I have counseled him to quit smoking. -I have recommended low-dose CT scan of the chest for lung cancer screening which has shown to improve survival.  He is agreeable to the scan.  3.  Family history: -Mother had kidney cancer and father had lymphoma.  Brother had myeloma and another brother had lymphoma.  4.  Mild thrombocytopenia: -He has mild thrombocytopenia since February 2020. -We will repeat his platelet count today.  If it continues to be low, will consider further work-up.   Total time spent is 60 minutes with more than 50% of the time spent face-to-face discussing further work-up,  management and counseling and coordination of care.  All questions were answered. The patient knows to call the clinic with any problems, questions or concerns.     Derek Jack, MD 04/08/19 6:58 PM

## 2019-04-08 NOTE — Patient Instructions (Signed)
[facility] Discharge Instructions  You were seen today by Dr. Katragadda. He went over your history, family history and how you've been feeling lately. He will have blood drawn today. He will see you back in 3 weeks for labs and follow up.   Thank you for choosing [facility] at [facility] to provide your oncology and hematology care.  To afford each patient quality time with our provider, please arrive at least 15 minutes before your scheduled appointment time.   If you have a lab appointment with the Cancer Center please come in thru the  Main Entrance and check in at the main information desk  You need to re-schedule your appointment should you arrive 10 or more minutes late.  We strive to give you quality time with our providers, and arriving late affects you and other patients whose appointments are after yours.  Also, if you no show three or more times for appointments you may be dismissed from the clinic at the providers discretion.     Again, thank you for choosing [facility].  Our hope is that these requests will decrease the amount of time that you wait before being seen by our physicians.       _____________________________________________________________  Should you have questions after your visit to [facility], please contact our office at [phone] between the hours of 8:00 a.m. and 4:30 p.m.  Voicemails left after 4:00 p.m. will not be returned until the following business day.  For prescription refill requests, have your pharmacy contact our office and allow 72 hours.    Cancer Center Support Programs:   > Cancer Support Group  2nd Tuesday of the month 1pm-2pm, Journey Room  '  

## 2019-04-08 NOTE — Progress Notes (Signed)
CRITICAL VALUE ALERT  Critical Value:  Carboxyhemoglobin 6.5  Date & Time Notied:  04/08/2019 at 1210  Provider Notified: Dr. Delton Coombes  Orders Received/Actions taken: n/a

## 2019-04-08 NOTE — Assessment & Plan Note (Addendum)
1.  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 Julien Nordmann in March 2016 for polycythemia. -Erythropoietin was 8 and Jak 2 V617 of test was negative. - Etiology includes sleep apnea, testosterone supplements and Lasix.  He is also current active smoker, 1 pack/day for 50 years. -He usually gets chest fullness and feels off in the head when he is hematocrit goes up and it is time for phlebotomy. - He was phlebotomized on 04/02/2019 with 18 ounces taken out, arranged by his PMD. - Denies any fevers, night sweats or weight loss. -CT abdomen in April 2018 showed normal spleen. -Denies any vasomotor symptoms or aquagenic pruritus.  No prior history of thrombosis. - We will check his CBC again today.  We will aim for hematocrit around 55 to 58%. - Will send for other Jak 2 mutations, CALR and MPL stations.  We will also repeat erythropoietin level.  Will check UA for hematuria.  We will also check carboxyhemoglobin level. -he will be seen back in 2 to 3 weeks for follow-up.  2.  Tobacco abuse: - He has smoked 1 pack/day for 50 years. -I have counseled him to quit smoking. -I have recommended low-dose CT scan of the chest for lung cancer screening which has shown to improve survival.  He is agreeable to the scan.  3.  Family history: -Mother had kidney cancer and father had lymphoma.  Brother had myeloma and another brother had lymphoma.  4.  Mild thrombocytopenia: -He has mild thrombocytopenia since February 2020. -We will repeat his platelet count today.  If it continues to be low, will consider further work-up.

## 2019-04-09 LAB — ERYTHROPOIETIN: Erythropoietin: 18.1 m[IU]/mL (ref 2.6–18.5)

## 2019-04-21 ENCOUNTER — Other Ambulatory Visit: Payer: Self-pay

## 2019-04-21 ENCOUNTER — Ambulatory Visit (HOSPITAL_COMMUNITY)
Admission: RE | Admit: 2019-04-21 | Discharge: 2019-04-21 | Disposition: A | Discharge status: Home / Self Care | Payer: Medicare Other | Source: Ambulatory Visit | Attending: Hematology | Admitting: Hematology

## 2019-04-21 DIAGNOSIS — F17210 Nicotine dependence, cigarettes, uncomplicated: Secondary | ICD-10-CM

## 2019-04-22 LAB — CALR + JAK2 E12-15 + MPL (REFLEXED)

## 2019-04-22 LAB — JAK2 V617F, W REFLEX TO CALR/E12/MPL

## 2019-04-27 ENCOUNTER — Encounter (HOSPITAL_COMMUNITY): Payer: Self-pay | Admitting: *Deleted

## 2019-04-27 NOTE — Progress Notes (Signed)
Results for LDCT were available as of today. I mailed patient a copy of the results as well as his primary care physician.  We will follow up again in 12 months for yearly screening LDCT.  He was recommended to follow up with his PCP for the incidental findings as noted below:   IMPRESSION: 1. Lung-RADS 2S, benign appearance or behavior. Continue annual screening with low-dose chest CT without contrast in 12 months. 2. The "S" modifier above refers to potentially clinically significant non lung cancer related findings. Specifically, there is aortic atherosclerosis, in addition to left main and 3 vessel coronary artery disease. Please note that although the presence of coronary artery calcium documents the presence of coronary artery disease, the severity of this disease and any potential stenosis cannot be assessed on this non-gated CT examination. Assessment for potential risk factor modification, dietary therapy or pharmacologic therapy may be warranted, if clinically indicated. 3. Mild diffuse bronchial wall thickening with mild centrilobular and paraseptal emphysema; imaging findings suggestive of underlying COPD. 4. Dilatation of the pulmonic trunk (4.5 cm in diameter), concerning for pulmonary arterial hypertension. 5. There are calcifications of the aortic valve. Echocardiographic correlation for evaluation of potential valvular dysfunction may be warranted if clinically indicated.  Aortic Atherosclerosis (ICD10-I70.0) and Emphysema (ICD10-J43.9).

## 2019-04-29 ENCOUNTER — Inpatient Hospital Stay (HOSPITAL_COMMUNITY): Payer: Medicare Other | Attending: Hematology | Admitting: Hematology

## 2019-04-29 ENCOUNTER — Encounter (HOSPITAL_COMMUNITY): Payer: Self-pay | Admitting: Hematology

## 2019-04-29 ENCOUNTER — Other Ambulatory Visit: Payer: Self-pay

## 2019-04-29 VITALS — BP 155/77 | HR 112 | Temp 97.8°F | Resp 18 | Wt 321.9 lb

## 2019-04-29 DIAGNOSIS — E119 Type 2 diabetes mellitus without complications: Secondary | ICD-10-CM | POA: Insufficient documentation

## 2019-04-29 DIAGNOSIS — D751 Secondary polycythemia: Secondary | ICD-10-CM | POA: Diagnosis present

## 2019-04-29 DIAGNOSIS — F17210 Nicotine dependence, cigarettes, uncomplicated: Secondary | ICD-10-CM | POA: Insufficient documentation

## 2019-04-29 DIAGNOSIS — G4730 Sleep apnea, unspecified: Secondary | ICD-10-CM | POA: Insufficient documentation

## 2019-04-29 DIAGNOSIS — K219 Gastro-esophageal reflux disease without esophagitis: Secondary | ICD-10-CM | POA: Insufficient documentation

## 2019-04-29 DIAGNOSIS — J449 Chronic obstructive pulmonary disease, unspecified: Secondary | ICD-10-CM | POA: Insufficient documentation

## 2019-04-29 DIAGNOSIS — E669 Obesity, unspecified: Secondary | ICD-10-CM | POA: Insufficient documentation

## 2019-04-29 DIAGNOSIS — Z79899 Other long term (current) drug therapy: Secondary | ICD-10-CM | POA: Diagnosis not present

## 2019-04-29 DIAGNOSIS — G4733 Obstructive sleep apnea (adult) (pediatric): Secondary | ICD-10-CM | POA: Diagnosis not present

## 2019-04-29 DIAGNOSIS — Z7982 Long term (current) use of aspirin: Secondary | ICD-10-CM | POA: Diagnosis not present

## 2019-04-29 NOTE — Progress Notes (Signed)
[place] [place], [place] [address]   CLINIC:  Medical Oncology/Hematology  PCP:  Loman [place], [place] [address] [phone]   REASON FOR VISIT:  Follow-up for polycythemia.  CURRENT THERAPY: Intermittent phlebotomies.   INTERVAL HISTORY:  Philip Powell 68 y.o. male seen for follow-up of polycythemia.  Last phlebotomy was on 04/02/2019.  He complains of left chest fullness and thinks he will need a phlebotomy.  Appetite is 100%.  Energy levels are 75%.  He is still continuing to smoke 1 pack/day of cigarettes.  Numbness in the hands has been stable.  Denies any fevers or chills.  Denies any nausea, vomiting, diarrhea or constipation.  No change in bowel habits reported.  No bleeding per rectum or melena.    REVIEW OF SYSTEMS:  Review of Systems  All other systems reviewed and are negative.    PAST MEDICAL/SURGICAL HISTORY:  Past Medical History:  Diagnosis Date  . Chronic pain   . COPD (chronic obstructive pulmonary disease) ([place])   . Cor pulmonale, chronic ([place])   . GERD (gastroesophageal reflux disease)   . History of diabetes mellitus    A1c 6.5 on 01/17/17; 6.3 on 03/25/2019  . History of pneumonia   . History of seizure   . Obesity   . Obstructive sleep apnea    CPAP  . Polycythemia    Past Surgical History:  Procedure Laterality Date  . CARPAL TUNNEL RELEASE Right 2008  . NEUROPLASTY / TRANSPOSITION MEDIAN NERVE AT CARPAL TUNNEL Bilateral   . VASECTOMY       SOCIAL HISTORY:  Social History   Socioeconomic History  . Marital status: Married    Spouse name: Not on file  . Number of children: 3  . Years of education: Not on file  . Highest education level: Not on file  Occupational History  . Occupation: lpn    Comment: retired  Scientific laboratory technician  . Financial resource strain: Not on file  . Food insecurity    Worry: Not on file    Inability: Not on file  . Transportation needs    Medical:  Not on file    Non-medical: Not on file  Tobacco Use  . Smoking status: Current Every Day Smoker    Packs/day: 1.00    Years: 43.00    Pack years: 43.00    Types: Cigarettes  . Smokeless tobacco: Never Used  . Tobacco comment: 0.5  Substance and Sexual Activity  . Alcohol use: Yes    Alcohol/week: 0.0 standard drinks    Comment: occasionally  . Drug use: No  . Sexual activity: Not Currently  Lifestyle  . Physical activity    Days per week: Not on file    Minutes per session: Not on file  . Stress: Not on file  Relationships  . Social Herbalist on phone: Not on file    Gets together: Not on file    Attends religious service: Not on file    Active member of club or organization: Not on file    Attends meetings of clubs or organizations: Not on file    Relationship status: Not on file  . Intimate partner violence    Fear of current or ex partner: Not on file    Emotionally abused: Not on file    Physically abused: Not on file    Forced sexual activity: Not on file  Other Topics Concern  .  Not on file  Social History Narrative  . Not on file    FAMILY HISTORY:  Family History  Problem Relation Age of Onset  . Cancer Brother   . Heart disease Maternal Grandfather   . Heart attack Maternal Grandfather 55  . Cancer Mother   . Cancer Father   . Hypertension Son   . Cancer Sister   . Cancer Brother   . Lupus Sister     CURRENT MEDICATIONS:  Outpatient Encounter Medications as of 04/29/2019  Medication Sig  . Ascorbic Acid (VITAMIN C PO) Take 1 tablet by mouth daily.  Marland Kitchen aspirin 325 MG tablet Take 325 mg by mouth daily.  . famotidine (PEPCID) 20 MG tablet Take 20 mg by mouth daily.  Derrill Memo ON 05/25/2019] HYDROcodone-acetaminophen (NORCO) 7.5-325 MG tablet Take 1 tablet by mouth 3 (three) times daily as needed for moderate pain.  . Multiple Vitamins-Minerals (CENTRUM ADULTS) TABS Take 1 tablet by mouth daily.  . OXYGEN Inhale 3 L into the lungs  continuous.  Marland Kitchen testosterone cypionate (DEPOTESTOTERONE CYPIONATE) 100 MG/ML injection Inject 100 mg into the muscle every 14 (fourteen) days. For IM use only   . albuterol (VENTOLIN HFA) 108 (90 Base) MCG/ACT inhaler Inhale 2 puffs into the lungs every 6 (six) hours as needed for wheezing or shortness of breath. (Patient not taking: Reported on 04/08/2019)  . furosemide (LASIX) 40 MG tablet Take 0.5 tablets (20 mg total) by mouth daily. (Patient not taking: Reported on 04/08/2019)  . ibuprofen (ADVIL) 200 MG tablet Take 400 mg by mouth every 6 (six) hours as needed for mild pain.  Marland Kitchen loratadine (CLARITIN) 10 MG tablet Take 10 mg by mouth daily as needed for allergies.  . [DISCONTINUED] HYDROcodone-acetaminophen (NORCO) 7.5-325 MG tablet Take 1 tablet by mouth 3 (three) times daily as needed for moderate pain.  . [DISCONTINUED] HYDROcodone-acetaminophen (NORCO) 7.5-325 MG tablet Take 1 tablet by mouth 3 (three) times daily as needed for moderate pain.   No facility-administered encounter medications on file as of 04/29/2019.     ALLERGIES:  No Known Allergies   PHYSICAL EXAM:  ECOG Performance status: 1  Vitals:   04/29/19 1453  BP: (!) 155/77  Pulse: (!) 112  Resp: 18  Temp: 97.8 F (36.6 C)  SpO2: 93%   Filed Weights   04/29/19 1453  Weight: (!) 321 lb 14.4 oz (146 kg)    Physical Exam Vitals signs reviewed.  Constitutional:      Appearance: Normal appearance.  Cardiovascular:     Rate and Rhythm: Normal rate and regular rhythm.     Heart sounds: Normal heart sounds.  Pulmonary:     Effort: Pulmonary effort is normal.     Breath sounds: Normal breath sounds.  Abdominal:     General: There is no distension.     Palpations: Abdomen is soft.  Musculoskeletal:        General: Swelling present.  Skin:    General: Skin is warm.  Neurological:     General: No focal deficit present.     Mental Status: He is alert and oriented to person, place, and time.  Psychiatric:         Mood and Affect: Mood normal.        Behavior: Behavior normal.      LABORATORY DATA:  I have reviewed the labs as listed.  CBC    Component Value Date/Time   WBC 8.3 04/08/2019 1138   RBC 6.66 (H) 04/08/2019 1138  HGB 17.3 (H) 04/08/2019 1138   HGB 17.9 (H) 03/25/2019 1422   HGB 15.5 12/16/2014 0911   HCT 58.2 (H) 04/08/2019 1138   HCT 57.7 (H) 03/25/2019 1422   HCT 47.9 12/16/2014 0911   PLT 148 (L) 04/08/2019 1138   PLT 118 (L) 03/25/2019 1422   MCV 87.4 04/08/2019 1138   MCV 82 03/25/2019 1422   MCV 92.5 12/16/2014 0911   MCH 26.0 04/08/2019 1138   MCHC 29.7 (L) 04/08/2019 1138   RDW 21.7 (H) 04/08/2019 1138   RDW 23.0 (H) 03/25/2019 1422   RDW 14.7 (H) 12/16/2014 0911   LYMPHSABS 1.1 04/08/2019 1138   LYMPHSABS 2.0 03/25/2019 1422   LYMPHSABS 2.2 12/16/2014 0911   MONOABS 1.0 04/08/2019 1138   MONOABS 1.4 (H) 12/16/2014 0911   EOSABS 0.2 04/08/2019 1138   EOSABS 0.2 03/25/2019 1422   BASOSABS 0.1 04/08/2019 1138   BASOSABS 0.1 03/25/2019 1422   BASOSABS 0.1 12/16/2014 0911   CMP Latest Ref Rng & Units 03/25/2019 12/18/2018 12/17/2018  Glucose 65 - 99 mg/dL 84 99 183(H)  BUN 8 - 27 mg/dL 13 18 20   Creatinine 0.76 - 1.27 mg/dL 0.74(L) 0.72 0.94  Sodium 134 - 144 mmol/L 141 139 138  Potassium 3.5 - 5.2 mmol/L 4.8 4.1 4.0  Chloride 96 - 106 mmol/L 97 92(L) 90(L)  CO2 20 - 29 mmol/L 28 38(H) 36(H)  Calcium 8.6 - 10.2 mg/dL 9.8 8.0(L) 8.0(L)  Total Protein 6.0 - 8.5 g/dL 7.1 - -  Total Bilirubin 0.0 - 1.2 mg/dL 1.3(H) - -  Alkaline Phos 39 - 117 IU/L 55 - -  AST 0 - 40 IU/L 47(H) - -  ALT 0 - 44 IU/L 38 - -       DIAGNOSTIC IMAGING:  I have independently reviewed the scans and discussed with the patient.     ASSESSMENT & PLAN:   Polycythemia 1.  Jak 2- polycythemia: - Evaluated by Dr. Julien Nordmann in March 2016 for polycythemia. - Erythropoietin was 18.1.  Jak 2 exon 12-15, CALR, MPL are negative.  UA was negative for hematuria.  Carboxyhemoglobin was  elevated at 2.5. - Last phlebotomy was on 04/02/2019, 18 ounces taken out. - Denies any fevers, night sweats or weight loss.  Denies any vasomotor symptoms or aquagenic pruritus.  No prior history of thrombosis. - Labs on 04/08/2019 shows hemoglobin 17.3 and hematocrit of 58. -Etiology of erythrocytosis includes sleep apnea, testosterone supplements, and Lasix.  He is also current active smoker, 1 pack/day for 50 years. - He usually gets chest fullness and feels off in the head when he requires phlebotomy.  We will arrange for phlebotomy today. -He will call us back when he needs 1.  I will see him back in 4 months with repeat blood work.  2.  Tobacco abuse: -He smoked 1 pack/day for 50 years. -We reviewed results of the CT lung cancer screening protocol dated 04/21/2019 which was lung RADS 2.  Annual screening was recommended in 12 months. -I also discussed with him about other findings including dilatation of the pulmonary trunk, 4.5 cm in diameter concerning for pulmonary artery hypertension.  He will discuss with his cardiologist.  3.  Intermittent mild thrombocytopenia: - He has intermittent mild thrombocytopenia since February 2020. - Last platelet count was 148 on 04/08/2019.   Total time spent is 25 minutes with more than 50% of the time spent face-to-face discussing lab results, treatment plan, counseling and coordination of care.  Orders placed this encounter:  Orders Placed This Encounter  Procedures  . CBC with Differential/Platelet  . Comprehensive metabolic panel      Derek Jack, MD [place] [phone]

## 2019-04-29 NOTE — Assessment & Plan Note (Signed)
1.  Jak 2- polycythemia: - Evaluated by Dr. Julien Nordmann in March 2016 for polycythemia. - Erythropoietin was 18.1.  Jak 2 exon 12-15, CALR, MPL are negative.  UA was negative for hematuria.  Carboxyhemoglobin was elevated at 2.5. - Last phlebotomy was on 04/02/2019, 18 ounces taken out. - Denies any fevers, night sweats or weight loss.  Denies any vasomotor symptoms or aquagenic pruritus.  No prior history of thrombosis. - Labs on 04/08/2019 shows hemoglobin 17.3 and hematocrit of 58. -Etiology of erythrocytosis includes sleep apnea, testosterone supplements, and Lasix.  He is also current active smoker, 1 pack/day for 50 years. - He usually gets chest fullness and feels off in the head when he requires phlebotomy.  We will arrange for phlebotomy today. -He will call us back when he needs 1.  I will see him back in 4 months with repeat blood work.  2.  Tobacco abuse: -He smoked 1 pack/day for 50 years. -We reviewed results of the CT lung cancer screening protocol dated 04/21/2019 which was lung RADS 2.  Annual screening was recommended in 12 months. -I also discussed with him about other findings including dilatation of the pulmonary trunk, 4.5 cm in diameter concerning for pulmonary artery hypertension.  He will discuss with his cardiologist.  3.  Intermittent mild thrombocytopenia: - He has intermittent mild thrombocytopenia since February 2020. - Last platelet count was 148 on 04/08/2019.

## 2019-04-29 NOTE — Patient Instructions (Signed)
[facility] at [facility] Discharge Instructions  You were seen today by Dr. Katragadda. He went over your recent lab results. He will see you back in 4 months for labs and follow up.   Thank you for choosing [facility] at [facility] to provide your oncology and hematology care.  To afford each patient quality time with our provider, please arrive at least 15 minutes before your scheduled appointment time.   If you have a lab appointment with the Cancer Center please come in thru the  Main Entrance and check in at the main information desk  You need to re-schedule your appointment should you arrive 10 or more minutes late.  We strive to give you quality time with our providers, and arriving late affects you and other patients whose appointments are after yours.  Also, if you no show three or more times for appointments you may be dismissed from the clinic at the providers discretion.     Again, thank you for choosing [facility].  Our hope is that these requests will decrease the amount of time that you wait before being seen by our physicians.       _____________________________________________________________  Should you have questions after your visit to  Cancer Center, please contact our office at [phone] between the hours of 8:00 a.m. and 4:30 p.m.  Voicemails left after 4:00 p.m. will not be returned until the following business day.  For prescription refill requests, have your pharmacy contact our office and allow 72 hours.    Cancer Center Support Programs:   > Cancer Support Group  2nd Tuesday of the month 1pm-2pm, Journey Room    

## 2019-05-05 ENCOUNTER — Other Ambulatory Visit: Payer: Self-pay

## 2019-05-05 ENCOUNTER — Inpatient Hospital Stay (HOSPITAL_COMMUNITY): Payer: Medicare Other

## 2019-05-05 ENCOUNTER — Encounter (HOSPITAL_COMMUNITY): Payer: Self-pay

## 2019-05-05 DIAGNOSIS — D751 Secondary polycythemia: Secondary | ICD-10-CM | POA: Diagnosis not present

## 2019-05-05 NOTE — Patient Instructions (Signed)

## 2019-05-05 NOTE — Progress Notes (Signed)
To treatment room for phlebotomy.  Oncology note and labs checked.  Patient ambulated to room with no complaints voiced.   Philip Powell presents today for phlebotomy per MD orders. Phlebotomy procedure started at 1355 and ended at 1406. 500 cc removed. Patient tolerated procedure well. IV needle removed intact.  Left peripheral IV site clean and dry with no bruising or swelling noted at site. Denied dizziness, nausea, SOB, or chest pain.   Band aid applied.  VSs with discharge and left ambulatory with no s/s of distress noted.

## 2019-05-06 NOTE — Telephone Encounter (Signed)
Chart reviewed.  Chest CT ordered by Dr. Delton Coombes.  Atherosclerotic findings are noted involving the great vessels and coronary arteries - this does not necessarily mean that he has obstructive stenoses however.  We should discuss this further at his pending office follow-up to see if we need to perform any additional testing.  As far as the calcification of the aortic valve is concerned, this was already assessed by recent echocardiogram, he has aortic valve sclerosis but no stenosis.  Other lung findings and evidence of pulmonary arterial hypertension were suggested by previous studies.

## 2019-05-07 ENCOUNTER — Telehealth: Payer: Self-pay | Admitting: Internal Medicine

## 2019-05-10 ENCOUNTER — Ambulatory Visit: Payer: Medicare Other | Admitting: Internal Medicine

## 2019-05-10 NOTE — Telephone Encounter (Signed)
Attempted to call patient to schedule pre-procedure covid testing, no answer, left message to call back.

## 2019-05-11 NOTE — Telephone Encounter (Signed)
Covid testing scheduled at [facility] on 10/22 Nothing further needed.

## 2019-05-20 ENCOUNTER — Other Ambulatory Visit: Payer: Self-pay

## 2019-05-20 ENCOUNTER — Other Ambulatory Visit: Payer: Self-pay | Admitting: *Deleted

## 2019-05-20 ENCOUNTER — Other Ambulatory Visit (HOSPITAL_COMMUNITY)
Admission: RE | Admit: 2019-05-20 | Discharge: 2019-05-20 | Disposition: A | Discharge status: Home / Self Care | Payer: Medicare Other | Source: Ambulatory Visit | Attending: Internal Medicine | Admitting: Internal Medicine

## 2019-05-20 DIAGNOSIS — J9612 Chronic respiratory failure with hypercapnia: Secondary | ICD-10-CM

## 2019-05-20 DIAGNOSIS — Z20828 Contact with and (suspected) exposure to other viral communicable diseases: Secondary | ICD-10-CM | POA: Diagnosis not present

## 2019-05-20 DIAGNOSIS — J9611 Chronic respiratory failure with hypoxia: Secondary | ICD-10-CM

## 2019-05-20 DIAGNOSIS — Z01812 Encounter for preprocedural laboratory examination: Secondary | ICD-10-CM | POA: Diagnosis present

## 2019-05-20 LAB — SARS CORONAVIRUS 2 (TAT 6-24 HRS): SARS Coronavirus 2: NEGATIVE

## 2019-05-24 ENCOUNTER — Encounter: Payer: Self-pay | Admitting: Internal Medicine

## 2019-05-24 ENCOUNTER — Ambulatory Visit (INDEPENDENT_AMBULATORY_CARE_PROVIDER_SITE_OTHER): Payer: Medicare Other | Admitting: Internal Medicine

## 2019-05-24 ENCOUNTER — Other Ambulatory Visit: Payer: Self-pay

## 2019-05-24 VITALS — BP 140/98 | HR 87 | Temp 97.7°F | Ht 70.0 in | Wt 322.0 lb

## 2019-05-24 DIAGNOSIS — D751 Secondary polycythemia: Secondary | ICD-10-CM

## 2019-05-24 DIAGNOSIS — G4733 Obstructive sleep apnea (adult) (pediatric): Secondary | ICD-10-CM

## 2019-05-24 DIAGNOSIS — J9611 Chronic respiratory failure with hypoxia: Secondary | ICD-10-CM | POA: Diagnosis not present

## 2019-05-24 DIAGNOSIS — J9612 Chronic respiratory failure with hypercapnia: Secondary | ICD-10-CM

## 2019-05-24 DIAGNOSIS — Z720 Tobacco use: Secondary | ICD-10-CM

## 2019-05-24 DIAGNOSIS — I2510 Atherosclerotic heart disease of native coronary artery without angina pectoris: Secondary | ICD-10-CM

## 2019-05-24 DIAGNOSIS — I7090 Unspecified atherosclerosis: Secondary | ICD-10-CM

## 2019-05-24 DIAGNOSIS — J449 Chronic obstructive pulmonary disease, unspecified: Secondary | ICD-10-CM

## 2019-05-24 LAB — PULMONARY FUNCTION TEST
DL/VA % pred: 116 %
DL/VA: 4.79 ml/min/mmHg/L
DLCO cor % pred: 98 %
DLCO cor: 26.29 ml/min/mmHg
DLCO unc % pred: 105 %
DLCO unc: 28.1 ml/min/mmHg
FEF 25-75 Post: 1.09 L/sec
FEF 25-75 Pre: 1.29 L/sec
FEF2575-%Change-Post: -15 %
FEF2575-%Pred-Post: 40 %
FEF2575-%Pred-Pre: 48 %
FEV1-%Change-Post: -6 %
FEV1-%Pred-Post: 58 %
FEV1-%Pred-Pre: 62 %
FEV1-Post: 2 L
FEV1-Pre: 2.13 L
FEV1FVC-%Change-Post: -1 %
FEV1FVC-%Pred-Pre: 90 %
FEV6-%Change-Post: -4 %
FEV6-%Pred-Post: 69 %
FEV6-%Pred-Pre: 72 %
FEV6-Post: 3.01 L
FEV6-Pre: 3.14 L
FEV6FVC-%Change-Post: 0 %
FEV6FVC-%Pred-Post: 105 %
FEV6FVC-%Pred-Pre: 104 %
FVC-%Change-Post: -4 %
FVC-%Pred-Post: 65 %
FVC-%Pred-Pre: 69 %
FVC-Post: 3.01 L
FVC-Pre: 3.16 L
Post FEV1/FVC ratio: 66 %
Post FEV6/FVC ratio: 100 %
Pre FEV1/FVC ratio: 67 %
Pre FEV6/FVC Ratio: 99 %
RV % pred: 162 %
RV: 3.86 L
TLC % pred: 108 %
TLC: 7.61 L

## 2019-05-24 MED ORDER — TRELEGY ELLIPTA 100-62.5-25 MCG/INH IN AEPB: 1.0000 | INHALATION_SPRAY | Freq: Every day | RESPIRATORY_TRACT | 0 refills | Status: DC

## 2019-05-24 NOTE — Progress Notes (Signed)
Patient seen in the office today and instructed on use of Trelegy.  Patient expressed understanding and demonstrated technique.  

## 2019-05-24 NOTE — Progress Notes (Signed)
Full PFT performed today. °

## 2019-05-24 NOTE — Progress Notes (Signed)
HPI M smoker followed for OSA,, complicated by HBP, Cor Pulmonale, dCHF, atherosclerosis, OSA, Chronic Respiratory Failure with Hypercapnia and Hypoxia,  PrimaryPolycythemia/ phlebotomy , Obesity, GERD NPSG 08/10/14- AHI 72/ hr, CPAP failed to control and changed to BIPAP 23/17. Study done on 3L O2 for control of desaturation despite BIPAP. Body weight 307 lbs BIPAP titration 01/28/2019-  CPAP inadequate. BIPAP to 22/18 and needed O2 3L to control Nocturnal Hypoxemia. Body weight 317 lbs PFT 05/24/2019- Moderate obstruction, no response to BD, Airtrapping, Nl Diffusion ---------------------------------------------------------------------------------------  01/06/2019- 27 yoM married smoker with hx OSA on BIPAP, coming to re-establish after LOV 2016. Recent Hosp discharge with dc plans including need for PFT and updated CPAP/ BIPAP titration because of CO2 retention. Medical problem list includes HBP, Cor Pulmonale, dCHF, OSA, Chronic Respiratory Failure with Hypercapnia and Hypoxia,  Polycythemia, Obesity, GERD BIPAP  16/12with O2 4L/ for sleep and continuous/ [employer] Download 93% compliance, AHI 8.2/ hr -----OSA currently on CPAP [employer]- Was in [place]. for pulmonary HTN and CO2 was high in and was advised needs new sleep study. Currently on 02@2L .  Body weight today 318 lbs-   Arrived on O2 2L- sat  96% He reports little cough or phlegm. Uses rescue inhaler < 1X/ day. Worked 3rd shift x 30 years. Frequent naps. Feels better now after recent hosp w diuresis. Feels better for awhile after phelbotomy for polycythemia. Epworth score 12 CXR 12/16/2018- 1V- IMPRESSION: 1. Cardiomegaly with improving pulmonary vascular congestion. 2. No significant airspace consolidation.  NPSG 08/10/14- AHI 72/ hr, CPAP failed to control and changed to BIPAP 23/17. Study done on 3L O2 for control of desaturation despite BIPAP. Body weight 307 lbs.  05/24/2019- 68 yoM smoker followed for OSA,, complicated by HBP, Cor  Pulmonale, dCHF, Atherosclerosis, OSA, Chronic Respiratory Failure with Hypercapnia and Hypoxia,  PrimaryPolycythemia/ phlebotomy , Obesity, GERD BIPAP titration 01/28/2019-  CPAP inadequate. BIPAP to 22/18 and needed O2 3L to control Nocturnal Hypoxemia. Body weight 317 lbs Body weight today- 322 lbs Download compliance 100%, AHI 8.4/ hr BIPAP 22/18, PS2, O2 3L / [employer] High leak.  Sleeps better with BIPAP Continues phlebotomy for polycythemia. Discussed effect of low O2 sats.  PFT 05/24/2019- Moderate obstruction, no response to BD, Airtrapping, Nl Diffusion Not much cough. Has not used his nebulizer. Occasional albuterol rescue.  Declines flu vax. CT chest Low Dose Screening- 04/21/2019-  IMPRESSION: 1. Lung-RADS 2S, benign appearance or behavior. Continue annual screening with low-dose chest CT without contrast in 12 months. 2. The "S" modifier above refers to potentially clinically significant non lung cancer related findings. Specifically, there is aortic atherosclerosis, in addition to left main and 3 vessel coronary artery disease. Please note that although the presence of coronary artery calcium documents the presence of coronary artery disease, the severity of this disease and any potential stenosis cannot be assessed on this non-gated CT examination. Assessment for potential risk factor modification, dietary therapy or pharmacologic therapy may be warranted, if clinically indicated. 3. Mild diffuse bronchial wall thickening with mild centrilobular and paraseptal emphysema; imaging findings suggestive of underlying COPD. 4. Dilatation of the pulmonic trunk (4.5 cm in diameter), concerning for pulmonary arterial hypertension. 5. There are calcifications of the aortic valve. Echocardiographic correlation for evaluation of potential valvular dysfunction may be warranted if clinically indicated. Aortic Atherosclerosis (ICD10-I70.0) and Emphysema (ICD10-J43.9).  ROS-see HPI   + =  positive Constitutional:    weight loss, night sweats, fevers, chills, fatigue, lassitude. HEENT:    headaches, difficulty swallowing, tooth/dental problems,  sore throat,       sneezing, itching, ear ache, nasal congestion, post nasal drip, snoring CV:    chest pain, orthopnea, PND, swelling in lower extremities, anasarca,                                  dizziness, palpitations Resp:   +shortness of breath with exertion or at rest.                productive cough,   non-productive cough, coughing up of blood.              change in color of mucus.  wheezing.   Skin:    rash or lesions. GI:  +heartburn, indigestion, abdominal pain, nausea, vomiting, diarrhea,                 change in bowel habits, loss of appetite GU: dysuria, change in color of urine, no urgency or frequency.   flank pain. MS:   joint pain, stiffness, decreased range of motion, back pain. Neuro-     nothing unusual Psych:  change in mood or affect.  depression or anxiety.   memory loss.  OBJ- Physical Exam General- Alert, Oriented, Affect-appropriate, Distress- none acute, +morbidly obese Skin- +plethoric Lymphadenopathy- none Head- atraumatic            Eyes- Gross vision intact, PERRLA, conjunctivae and secretions clear            Ears- Hearing, canals-normal            Nose- Clear, no-Septal dev, mucus, polyps, erosion, perforation             Throat- Mallampati II , mucosa clear , drainage- none, tonsils- atrophic Neck- flexible , trachea midline, no stridor , thyroid nl, carotid no bruit Chest - symmetrical excursion , unlabored           Heart/CV- RRR , no murmur , no gallop  , no rub, nl s1 s2                           - JVD- none , edema- none, stasis changes- none, varices- none           Lung- clear to P&A, wheeze- none, cough- none , dullness-none, rub- none           Chest wall-  Abd-  Br/ Gen/ Rectal- Not done, not indicated Extrem- cyanosis- none, clubbing, none, atrophy- none, strength- nl Neuro-  grossly intact to observation

## 2019-05-24 NOTE — Patient Instructions (Addendum)
Order- DME  [employer]  Please reduce BIPAP to 20/16, continue mask of choice, humidifier, supplies, AirView/ card Continue supplemental O2 3L for sleep  Sample Trelegy inhaler   Inhale 1 puff then rinse mouth, once daily  Please call if we can help   Printed script for Port[place]ble O2 concentrator 3L pulse    Dx Chronic Hypoxic Respiratory Failure

## 2019-05-27 ENCOUNTER — Other Ambulatory Visit: Payer: Self-pay

## 2019-05-27 ENCOUNTER — Encounter: Payer: Self-pay | Admitting: Cardiology

## 2019-05-27 ENCOUNTER — Telehealth (HOSPITAL_COMMUNITY): Payer: Self-pay | Admitting: Surgery

## 2019-05-27 ENCOUNTER — Ambulatory Visit (INDEPENDENT_AMBULATORY_CARE_PROVIDER_SITE_OTHER): Payer: Medicare Other | Admitting: Cardiology

## 2019-05-27 ENCOUNTER — Inpatient Hospital Stay (HOSPITAL_COMMUNITY): Payer: Medicare Other

## 2019-05-27 ENCOUNTER — Telehealth: Payer: Self-pay | Admitting: Cardiology

## 2019-05-27 VITALS — BP 160/70 | HR 89 | Ht 72.0 in | Wt 319.0 lb

## 2019-05-27 DIAGNOSIS — D751 Secondary polycythemia: Secondary | ICD-10-CM

## 2019-05-27 DIAGNOSIS — I2510 Atherosclerotic heart disease of native coronary artery without angina pectoris: Secondary | ICD-10-CM | POA: Diagnosis not present

## 2019-05-27 DIAGNOSIS — G4733 Obstructive sleep apnea (adult) (pediatric): Secondary | ICD-10-CM | POA: Diagnosis not present

## 2019-05-27 DIAGNOSIS — I272 Pulmonary hypertension, unspecified: Secondary | ICD-10-CM

## 2019-05-27 LAB — CBC WITH DIFFERENTIAL/PLATELET
Abs Immature Granulocytes: 0.03 10*3/uL (ref 0.00–0.07)
Basophils Absolute: 0.1 10*3/uL (ref 0.0–0.1)
Basophils Relative: 1 %
Eosinophils Absolute: 0.2 10*3/uL (ref 0.0–0.5)
Eosinophils Relative: 3 %
HCT: 56.9 % — ABNORMAL HIGH (ref 39.0–52.0)
Hemoglobin: 17.1 g/dL — ABNORMAL HIGH (ref 13.0–17.0)
Immature Granulocytes: 0 %
Lymphocytes Relative: 22 %
Lymphs Abs: 1.7 10*3/uL (ref 0.7–4.0)
MCH: 25.8 pg — ABNORMAL LOW (ref 26.0–34.0)
MCHC: 30.1 g/dL (ref 30.0–36.0)
MCV: 86 fL (ref 80.0–100.0)
Monocytes Absolute: 1.1 10*3/uL — ABNORMAL HIGH (ref 0.1–1.0)
Monocytes Relative: 15 %
Neutro Abs: 4.5 10*3/uL (ref 1.7–7.7)
Neutrophils Relative %: 59 %
Platelets: 193 10*3/uL (ref 150–400)
RBC: 6.62 MIL/uL — ABNORMAL HIGH (ref 4.22–5.81)
RDW: 18.8 % — ABNORMAL HIGH (ref 11.5–15.5)
WBC: 7.7 10*3/uL (ref 4.0–10.5)
nRBC: 0 % (ref 0.0–0.2)

## 2019-05-27 LAB — COMPREHENSIVE METABOLIC PANEL
ALT: 34 U/L (ref 0–44)
AST: 39 U/L (ref 15–41)
Albumin: 4.4 g/dL (ref 3.5–5.0)
Alkaline Phosphatase: 49 U/L (ref 38–126)
Anion gap: 11 (ref 5–15)
BUN: 16 mg/dL (ref 8–23)
CO2: 27 mmol/L (ref 22–32)
Calcium: 9.3 mg/dL (ref 8.9–10.3)
Chloride: 100 mmol/L (ref 98–111)
Creatinine, Ser: 0.67 mg/dL (ref 0.61–1.24)
GFR calc Af Amer: >60 mL/min (ref >60)
GFR calc non Af Amer: >60 mL/min (ref >60)
Glucose, Bld: 102 mg/dL — ABNORMAL HIGH (ref 70–99)
Potassium: 4.4 mmol/L (ref 3.5–5.1)
Sodium: 138 mmol/L (ref 135–145)
Total Bilirubin: 1.5 mg/dL — ABNORMAL HIGH (ref 0.3–1.2)
Total Protein: 7.5 g/dL (ref 6.5–8.1)

## 2019-05-27 NOTE — Telephone Encounter (Signed)
Pre-cert Verification for the following procedure    2 D ECHO WITH DEFINITY  Scheduled for 06/03/2019 at [facility].

## 2019-05-27 NOTE — Patient Instructions (Addendum)
Medication Instructions:   Your physician recommends that you continue on your current medications as directed. Please refer to the Current Medication list given to you today.  Please get your atorvastatin from the pharmacy. Dr. Domenic Polite would like for you to take 10 mg daily  Continue all other medications the same  Labwork:  NONE  Testing/Procedures: Your physician has requested that you have an echocardiogram with definity. Echocardiography is a painless test that uses sound waves to create images of your heart. It provides your doctor with information about the size and shape of your heart and how well your heart's chambers and valves are working. This procedure takes approximately one hour. There are no restrictions for this procedure.  Follow-Up:  Your physician recommends that you schedule a follow-up appointment in: 2 months.  Any Other Special Instructions Will Be Listed Below (If Applicable).  Please check your blood pressure once a day for 1-2 weeks before your next visit. You may send these readings to [place] through mychart or bring with you to your visit for review by your doctor. Please use the same cuff, same arm.   If you need a refill on your cardiac medications before your next appointment, please call your pharmacy.

## 2019-05-27 NOTE — Progress Notes (Signed)
Cardiology Office Note  Date: 05/27/2019   ID: Philip Powell, Philip Powell 1951-04-05, MRN [id number]  PCP:  Loman Brooklyn, FNP  Cardiologist:  Rozann Lesches, MD Electrophysiologist:  None   Chief Complaint  Patient presents with  . Cardiac follow-up    History of Present Illness: Philip Powell is a 68 y.o. male last seen in June.  He presents for a follow-up visit today.  He tells me that he has been feeling somewhat better, using supplemental oxygen, also compliant with BiPAP at nighttime.  He is trying to quit smoking.  He did have a recent lung cancer screening chest CT that incidentally reported multivessel coronary artery calcification, also calcification of the aortic valve.  Pulmonary trunk was dilated consistent with his known pulmonary artery hypertension.  I discussed these results with him today as well as implications.  He has been on aspirin, no antihypertensives at this point.  I talked with him about diet and weight loss, also considering a low-dose agent such as losartan.  I also talked with him about better control of lipids, his recent LDL was 108, he had already been prescribed Lipitor 10 mg daily by PCP but had not started it yet.  He is following with Delton Coombes in the hematology clinic for management of polycythemia.  He undergoes intermittent phlebotomy.  He is also following with Dr. Annamaria Boots for management of OSA on BiPAP.  Past Medical History:  Diagnosis Date  . Chronic pain   . COPD (chronic obstructive pulmonary disease) ([place])   . Cor pulmonale, chronic ([place])   . GERD (gastroesophageal reflux disease)   . History of diabetes mellitus    A1c 6.5 on 01/17/17; 6.3 on 03/25/2019  . History of pneumonia   . History of seizure   . Obesity   . Obstructive sleep apnea    CPAP  . Polycythemia     Past Surgical History:  Procedure Laterality Date  . CARPAL TUNNEL RELEASE Right 2008  . NEUROPLASTY / TRANSPOSITION MEDIAN NERVE AT CARPAL TUNNEL  Bilateral   . VASECTOMY      Current Outpatient Medications  Medication Sig Dispense Refill  . albuterol (VENTOLIN HFA) 108 (90 Base) MCG/ACT inhaler Inhale 2 puffs into the lungs every 6 (six) hours as needed for wheezing or shortness of breath. 1 Inhaler 0  . Ascorbic Acid (VITAMIN C PO) Take 1 tablet by mouth daily.    Marland Kitchen aspirin 325 MG tablet Take 325 mg by mouth daily.    . famotidine (PEPCID) 20 MG tablet Take 20 mg by mouth daily.    . Fluticasone-Umeclidin-Vilant (TRELEGY ELLIPTA) 100-62.5-25 MCG/INH AEPB Inhale 1 puff into the lungs daily. 1 each 0  . furosemide (LASIX) 40 MG tablet Take 0.5 tablets (20 mg total) by mouth daily. 30 tablet 0  . HYDROcodone-acetaminophen (NORCO) 7.5-325 MG tablet Take 1 tablet by mouth 3 (three) times daily as needed for moderate pain. 90 tablet 0  . ibuprofen (ADVIL) 200 MG tablet Take 400 mg by mouth every 6 (six) hours as needed for mild pain.    Marland Kitchen ipratropium-albuterol (DUONEB) 0.5-2.5 (3) MG/3ML SOLN Take 3 mLs by nebulization as needed.    . loratadine (CLARITIN) 10 MG tablet Take 10 mg by mouth daily as needed for allergies.    . Multiple Vitamins-Minerals (CENTRUM ADULTS) TABS Take 1 tablet by mouth daily.    . OXYGEN Inhale 3 L into the lungs continuous.    Marland Kitchen testosterone cypionate (DEPOTESTOTERONE CYPIONATE) 100 MG/ML injection  Inject 100 mg into the muscle every 14 (fourteen) days. For IM use only      No current facility-administered medications for this visit.    Allergies:  Patient has no known allergies.   Social History: The patient  reports that he has been smoking cigarettes. He has a 43.00 pack-year smoking history. He has never used smokeless tobacco. He reports current alcohol use. He reports that he does not use drugs.   ROS:  Please see the history of present illness. Otherwise, complete review of systems is positive for none.  All other systems are reviewed and negative.   Physical Exam: VS:  BP (!) 160/70   Pulse 89   Ht  6' (1.829 m)   Wt (!) 319 lb (144.7 kg)   SpO2 92%   BMI 43.26 kg/m , BMI Body mass index is 43.26 kg/m.  Wt Readings from Last 3 Encounters:  05/27/19 (!) 319 lb (144.7 kg)  05/24/19 (!) 322 lb (146.1 kg)  04/29/19 (!) 321 lb 14.4 oz (146 kg)    General: Morbidly obese male, wearing oxygen via nasal cannula. HEENT: Conjunctiva and lids normal, wearing a mask. Neck: Supple, increased girth, no carotid bruits, no thyromegaly. Lungs: Clear to auscultation, nonlabored breathing at rest. Cardiac: Regular rate and rhythm, no S3, soft systolic murmur. Abdomen: Protuberant, nontender, bowel sounds present. Extremities: Chronic appearing edema and venous stasis, distal pulses 2+. Skin: Warm and dry. Musculoskeletal: No kyphosis. Neuropsychiatric: Alert and oriented x3, affect grossly appropriate.  ECG:  An ECG dated 12/16/2018 was personally reviewed today and demonstrated:  Sinus rhythm with decreased R wave progression, probable biatrial enlargement.  Recent Labwork: 12/15/2018: B Natriuretic Peptide 430.7; TSH 1.262 12/16/2018: Magnesium 2.3 03/25/2019: ALT 38; AST 47; BUN 13; Creatinine, Ser 0.74; Potassium 4.8; Sodium 141 04/08/2019: Hemoglobin 17.3; Platelets 148     Component Value Date/Time   CHOL 183 03/25/2019 1422   TRIG 253 (H) 03/25/2019 1422   HDL 24 (L) 03/25/2019 1422   CHOLHDL 7.6 (H) 03/25/2019 1422   LDLCALC 108 (H) 03/25/2019 1422    Other Studies Reviewed Today:  Echocardiogram 12/15/2018: 1. The left ventricle has normal systolic function with an ejection fraction of 60-65%. The cavity size was normal. There is mildly increased left ventricular wall thickness. Left ventricular diastolic Doppler parameters are consistent with impaired  relaxation. There is right ventricular volume and pressure overload and respiratory related leftward septal shift, likely secondary to pulmonary disease, cannot exclude pericardial constriction. 2. The right ventricle has preserved  function at the base and mid ventricle, with moderately reduced function at the RV apex. The cavity was moderately enlarged. There is no increase in right ventricular wall thickness. Right ventricular systolic  pressure is severely elevated with an estimated pressure of 71.7 mmHg. 3. Sclerosis without any evidence of stenosis of the aortic valve. 4. Right atrial size was mildly dilated.  Assessment and Plan:  1.  Severe pulmonary hypertension, most likely group 3 in the setting of obesity with hypoventilation syndrome, OSA, and COPD with tobacco abuse.  Recent screening chest CT showed dilatation of the pulmonary trunk consistent with this as well.  I have talked with him about weight loss.  He reports compliance with BiPAP and also using supplemental oxygen.  We will obtain a follow-up echocardiogram for reevaluation.  2.  Multivessel coronary artery calcification by recent chest CT imaging.  He will continue aspirin, I have asked him to go ahead and start Lipitor 10 mg daily, we can titrate  as needed, his recent LDL was 108.  I also asked him to keep a closer eye on blood pressure and recommended that we start low-dose Cozaar, he wanted to hold off on adding medication for now.  3.  Polycythemia, continues to follow with hematology and undergoes intermittent phlebotomy.  Hemoglobin was 17.3 in July.  4.  COPD with tobacco abuse.  He is working on smoking cessation.  Medication Adjustments/Labs and Tests Ordered: Current medicines are reviewed at length with the patient today.  Concerns regarding medicines are outlined above.   Tests Ordered: Orders Placed This Encounter  Procedures  . ECHOCARDIOGRAM COMPLETE    Medication Changes: No orders of the defined types were placed in this encounter.   Disposition:  Follow up 2 months in the [place] office.  Signed, Satira Sark, MD, [facility] 05/27/2019 10:42 AM    [facility] [place], [place],  [place] [address] Phone: [phone]; Fax: [phone]

## 2019-05-27 NOTE — Telephone Encounter (Signed)
Pt had left a voicemail requesting his hemoglobin to be checked.  I sent a message to Ashley/Dr. Raliegh Ip and pt is being scheduled for a CBC today.

## 2019-05-30 ENCOUNTER — Encounter: Payer: Self-pay | Admitting: Internal Medicine

## 2019-05-30 DIAGNOSIS — J449 Chronic obstructive pulmonary disease, unspecified: Secondary | ICD-10-CM | POA: Insufficient documentation

## 2019-05-30 DIAGNOSIS — I7090 Unspecified atherosclerosis: Secondary | ICD-10-CM | POA: Insufficient documentation

## 2019-05-30 NOTE — Assessment & Plan Note (Signed)
For discussion with his PCP. Consider referral to cardiology if appropriate

## 2019-05-30 NOTE — Assessment & Plan Note (Signed)
Urgent that he stop now and completely. He doesn't indicate interest in trying.

## 2019-05-30 NOTE — Assessment & Plan Note (Signed)
O2 dependent now on 3L through CPAP, 2-3L while awake.

## 2019-05-30 NOTE — Assessment & Plan Note (Signed)
Benefits from BIPAP  With good compliance. Feels pressure is too high. Watching residual AHI on downloads, but Plan change to 20/ 16

## 2019-05-30 NOTE — Assessment & Plan Note (Signed)
Explained importance of avoiding hypoxemia, even if his is primary polycythemia.

## 2019-05-30 NOTE — Assessment & Plan Note (Signed)
Chronic smoker. Little cough and no response to dilator on PFT, but will let him try Trelegy sample.

## 2019-06-03 ENCOUNTER — Ambulatory Visit (HOSPITAL_COMMUNITY)
Admission: RE | Admit: 2019-06-03 | Discharge: 2019-06-03 | Disposition: A | Discharge status: Home / Self Care | Payer: Medicare Other | Source: Ambulatory Visit | Attending: Cardiology | Admitting: Cardiology

## 2019-06-03 ENCOUNTER — Inpatient Hospital Stay (HOSPITAL_COMMUNITY): Payer: Medicare Other | Attending: Hematology

## 2019-06-03 ENCOUNTER — Other Ambulatory Visit: Payer: Self-pay

## 2019-06-03 ENCOUNTER — Encounter (HOSPITAL_COMMUNITY): Payer: Self-pay

## 2019-06-03 VITALS — BP 112/69 | HR 86 | Temp 97.5°F | Resp 20

## 2019-06-03 DIAGNOSIS — I272 Pulmonary hypertension, unspecified: Secondary | ICD-10-CM

## 2019-06-03 DIAGNOSIS — D751 Secondary polycythemia: Secondary | ICD-10-CM

## 2019-06-03 NOTE — Progress Notes (Signed)
To treatment room for phlebotomy.  Oncology note and labs checked.  Patient ambulated to room with no complaints voiced.   Philip Powell presents today for phlebotomy per MD orders. Phlebotomy procedure started at 1247 and ended at 1253. 500 cc removed. Patient tolerated procedure well. IV needle removed intact.  Left peripheral IV site clean and dry with no bruising or swelling noted at site. Denied dizziness, nausea, SOB, or chest pain.   Band aid applied.  VSs with discharge and left ambulatory with no s/s of distress noted.

## 2019-06-03 NOTE — Progress Notes (Signed)
*  PRELIMINARY RESULTS* Echocardiogram 2D Echocardiogram has been performed.  Philip Powell 06/03/2019, 12:23 PM

## 2019-06-04 ENCOUNTER — Telehealth: Payer: Self-pay | Admitting: *Deleted

## 2019-06-04 NOTE — Telephone Encounter (Signed)
-----   Message from Satira Sark, MD sent at 06/03/2019  5:56 PM EST ----- Results reviewed.  LVEF is normal range at 60 to 65%.  RV function also normal.  There is aortic valve sclerosis but no stenosis.  Tricuspid regurgitation is trivial, unable to estimate pulmonary artery systolic pressure.  Suggest continued medical therapy and follow-up as planned.

## 2019-06-08 NOTE — Telephone Encounter (Signed)
Patient informed. Copy sent to PCP °

## 2019-06-18 ENCOUNTER — Encounter: Payer: Self-pay | Admitting: Family Medicine

## 2019-06-18 DIAGNOSIS — Z79899 Other long term (current) drug therapy: Secondary | ICD-10-CM

## 2019-06-18 DIAGNOSIS — M47812 Spondylosis without myelopathy or radiculopathy, cervical region: Secondary | ICD-10-CM

## 2019-06-18 DIAGNOSIS — M180 Bilateral primary osteoarthritis of first carpometacarpal joints: Secondary | ICD-10-CM

## 2019-06-18 MED ORDER — HYDROCODONE-ACETAMINOPHEN 7.5-325 MG PO TABS: 1.0000 | ORAL_TABLET | Freq: Three times a day (TID) | ORAL | 0 refills | Status: DC | PRN

## 2019-06-22 NOTE — Progress Notes (Addendum)
Virtual Visit via Telephone Note  I connected with Philip Powell on 06/29/19 at 12:57 PM by telephone and verified that I am speaking with the correct person using two identifiers. Philip Powell is currently located at home and nobody is currently with him during this visit. The provider, Loman Brooklyn, FNP is located in their office at time of visit.  I discussed the limitations, risks, security and privacy concerns of performing an evaluation and management service by telephone and the availability of in person appointments. I also discussed with the patient that there may be a patient responsible charge related to this service. The patient expressed understanding and agreed to proceed.  Subjective: PCP: Loman Brooklyn, FNP  Chief Complaint  Patient presents with  . Pain   She has been keeping a close eye on his blood pressure at the recommendation of his cardiologist.  He reports his average BP is 130s/80s.  Patient did not get the prescription of atorvastatin 10 mg filled that I sent in until after he went to see his cardiologist who also recommended the same thing.  He does report he is now taking it every day.  Pain assessment: Cause of pain- degeneratives changes/arthritis in hands; degenerative changes, foraminal narrowing, and joint space disk narrowing with osteophyte formation especially at C5-6; increased size of nerve in wrists Pain location- bilateral hands and neck Pain on scale of 1-10- 5-6/10 with medication Frequency- daily What increases pain- cold, increased activiity What makes pain Better- pain medication Effects on ADL - makes difficult but gets through it Any change in general medical condition- No  Current opioids rx- Norco 7.5/325 TID PRN # meds rx- 90 Effectiveness of current meds- somewhat Adverse reactions form pain meds- none Morphine equivalent- 22.50  Pill count performed- No Last drug screen - 03/25/19 ( high risk q73m, moderate  risk q15m, low risk yearly ) Urine drug screen today- No Was the [place] reviewed- Yes  If yes were their any concerning findings? - No  Pain contract signed on: 03/25/2019   Patient feels he could benefit from something to treat depression. He reports he stays at home and lives a sedentary lifestyle. 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 Depression screen [facility] 2/9 06/29/2019 03/25/2019  Decreased Interest 2 0  Down, Depressed, Hopeless 2 0  PHQ - 2 Score 4 0  Altered sleeping 2 0  Tired, decreased energy 2 3  Change in appetite 0 0  Feeling bad or failure about yourself  2 0  Trouble concentrating 1 0  Moving slowly or fidgety/restless 0 0  Suicidal thoughts 3 0  PHQ-9 Score 14 3  Difficult doing work/chores Somewhat difficult -    ROS: Per HPI  Current Outpatient Medications:  .  albuterol (VENTOLIN HFA) 108 (90 Base) MCG/ACT inhaler, Inhale 2 puffs into the lungs every 6 (six) hours as needed for wheezing or shortness of breath., Disp: 1 Inhaler, Rfl: 0 .  Ascorbic Acid (VITAMIN C PO), Take 1 tablet by mouth daily., Disp: , Rfl:  .  aspirin 325 MG tablet, Take 325 mg by mouth daily., Disp: , Rfl:  .  famotidine (PEPCID) 20 MG tablet, Take 20 mg by mouth daily., Disp: , Rfl:  .  Fluticasone-Umeclidin-Vilant (TRELEGY ELLIPTA) 100-62.5-25 MCG/INH AEPB, Inhale 1 puff into the lungs daily., Disp: 1 each, Rfl: 0 .  furosemide (LASIX) 40 MG tablet, Take 0.5 tablets (20 mg total) by mouth daily., Disp: 30 tablet, Rfl: 0 .  HYDROcodone-acetaminophen (NORCO) 7.5-325 MG tablet, Take 1 tablet by mouth 3 (three) times daily as needed for moderate pain., Disp: 90 tablet, Rfl: 0 .  ibuprofen (ADVIL) 200 MG tablet, Take 400 mg by mouth every 6 (six) hours as needed for mild pain., Disp: , Rfl:  .  ipratropium-albuterol (DUONEB) 0.5-2.5 (3) MG/3ML SOLN, Take 3 mLs by nebulization as needed., Disp: , Rfl:  .  loratadine (CLARITIN) 10 MG tablet, Take 10 mg by mouth daily as needed for  allergies., Disp: , Rfl:  .  Multiple Vitamins-Minerals (CENTRUM ADULTS) TABS, Take 1 tablet by mouth daily., Disp: , Rfl:  .  OXYGEN, Inhale 3 L into the lungs continuous., Disp: , Rfl:  .  testosterone cypionate (DEPOTESTOTERONE CYPIONATE) 100 MG/ML injection, Inject 100 mg into the muscle every 14 (fourteen) days. For IM use only , Disp: , Rfl:   No Known Allergies Past Medical History:  Diagnosis Date  . Chronic pain   . COPD (chronic obstructive pulmonary disease) ([place])   . Cor pulmonale, chronic ([place])   . GERD (gastroesophageal reflux disease)   . History of diabetes mellitus    A1c 6.5 on 01/17/17; 6.3 on 03/25/2019  . History of pneumonia   . History of seizure   . Obesity   . Obstructive sleep apnea    CPAP  . Polycythemia     Observations/Objective: A&O  No respiratory distress or wheezing audible over the phone Mood, judgement, and thought processes all WNL  Assessment and Plan: 1-3. Primary osteoarthritis of both first carpometacarpal joints/Cervical spondylosis without myelopathy/Controlled substance agreement signed - Patient tolerates the relief provided on current regimen and has accepted this amount of pain as his normal. I am prescribing Cymbalta to help with depression so that it can also help with his pain. - HYDROcodone-acetaminophen (NORCO) 7.5-325 MG tablet; Take 1 tablet by mouth 3 (three) times daily as needed for moderate pain.  Dispense: 90 tablet; Refill: 0 - HYDROcodone-acetaminophen (NORCO) 7.5-325 MG tablet; Take 1 tablet by mouth 3 (three) times daily as needed for moderate pain.  Dispense: 90 tablet; Refill: 0 - HYDROcodone-acetaminophen (NORCO) 7.5-325 MG tablet; Take 1 tablet by mouth 3 (three) times daily as needed for moderate pain.  Dispense: 90 tablet; Refill: 0  4. Low testosterone - Well controlled on current regimen.  - testosterone cypionate (DEPOTESTOTERONE CYPIONATE) 100 MG/ML injection; Inject 1 mL (100 mg total) into the muscle every 14  (fourteen) days. For IM use only  Dispense: 10 mL; Refill: 1  5. Depression, major, single episode, moderate ([place]) - Starting Cymbalta today. Advised patient to take 30 mg once daily for the first week and then increase to twice daily.  - DULoxetine (CYMBALTA) 30 MG capsule; Take 1 capsule (30 mg total) by mouth 2 (two) times daily.  Dispense: 60 capsule; Refill: 2  6. Atherosclerosis - Patient is now taking atorvastatin 10 mg once daily.  7. Colon cancer screening - Ambulatory referral to Gastroenterology  Follow Up Instructions:  Return in about 6 weeks (around 08/10/2019) for depression, then 3 months for pain, prediabetes.  I discussed the assessment and treatment plan with the patient. The patient was provided an opportunity to ask questions and all were answered. The patient agreed with the plan and demonstrated an understanding of the instructions.   The patient was advised to call back or seek an in-person evaluation if the symptoms worsen or if the condition fails to improve as anticipated.  The above assessment and management plan was discussed  with the patient. The patient verbalized understanding of and has agreed to the management plan. Patient is aware to call the clinic if symptoms persist or worsen. Patient is aware when to return to the clinic for a follow-up visit. Patient educated on when it is appropriate to go to the emergency department.   Time call ended: 1:18 PM  I provided 25 minutes of non-face-to-face time during this encounter.  Hendricks Limes, MSN, APRN, FNP-C [facility] 06/29/19

## 2019-06-29 ENCOUNTER — Encounter: Payer: Self-pay | Admitting: Family Medicine

## 2019-06-29 ENCOUNTER — Telehealth: Payer: Self-pay | Admitting: Family Medicine

## 2019-06-29 ENCOUNTER — Ambulatory Visit (INDEPENDENT_AMBULATORY_CARE_PROVIDER_SITE_OTHER): Payer: Medicare Other | Admitting: Family Medicine

## 2019-06-29 DIAGNOSIS — Z1211 Encounter for screening for malignant neoplasm of colon: Secondary | ICD-10-CM

## 2019-06-29 DIAGNOSIS — R7989 Other specified abnormal findings of blood chemistry: Secondary | ICD-10-CM | POA: Insufficient documentation

## 2019-06-29 DIAGNOSIS — Z79899 Other long term (current) drug therapy: Secondary | ICD-10-CM

## 2019-06-29 DIAGNOSIS — M47812 Spondylosis without myelopathy or radiculopathy, cervical region: Secondary | ICD-10-CM

## 2019-06-29 DIAGNOSIS — M180 Bilateral primary osteoarthritis of first carpometacarpal joints: Secondary | ICD-10-CM

## 2019-06-29 DIAGNOSIS — F321 Major depressive disorder, single episode, moderate: Secondary | ICD-10-CM

## 2019-06-29 DIAGNOSIS — I7090 Unspecified atherosclerosis: Secondary | ICD-10-CM

## 2019-06-29 MED ORDER — HYDROCODONE-ACETAMINOPHEN 7.5-325 MG PO TABS: 1.0000 | ORAL_TABLET | Freq: Three times a day (TID) | ORAL | 0 refills | Status: DC | PRN

## 2019-06-29 MED ORDER — TESTOSTERONE CYPIONATE 200 MG/ML IM SOLN: 200.0000 mg | INTRAMUSCULAR | 1 refills | Status: DC

## 2019-06-29 MED ORDER — TESTOSTERONE CYPIONATE 100 MG/ML IM SOLN: 100.0000 mg | INTRAMUSCULAR | 1 refills | Status: DC

## 2019-06-29 MED ORDER — DULOXETINE HCL 30 MG PO CPEP: 30.0000 mg | ORAL_CAPSULE | Freq: Two times a day (BID) | ORAL | 2 refills | Status: AC

## 2019-06-29 NOTE — Telephone Encounter (Signed)
Philip Powell from [facility] pharmacy called stating that pt's previous PCP had pt taking 200 units of the testosterone instead of the 100 that was recently prescribed to him. Wants to know if the units were decreased or if this was a miscommunication/typo.

## 2019-06-29 NOTE — Telephone Encounter (Signed)
Aaron Edelman at [facility].

## 2019-06-29 NOTE — Telephone Encounter (Signed)
My apologies. The medication was entered into our system as the 100 mg/mL and I just reordered what was on his medication list. I sent a new prescription.

## 2019-06-30 ENCOUNTER — Encounter: Payer: Self-pay | Admitting: Gastroenterology

## 2019-07-13 ENCOUNTER — Telehealth: Payer: Self-pay | Admitting: *Deleted

## 2019-07-13 NOTE — Telephone Encounter (Signed)
During chart prep we see that the patient is on Oxygen at home. Called patient, no answer, left message for the patient to call our office back to make an office visit before the colonoscopy can be done due to being on Oxygen at home.

## 2019-07-15 ENCOUNTER — Other Ambulatory Visit (HOSPITAL_COMMUNITY): Payer: Self-pay | Admitting: *Deleted

## 2019-07-15 DIAGNOSIS — D751 Secondary polycythemia: Secondary | ICD-10-CM

## 2019-07-16 ENCOUNTER — Inpatient Hospital Stay (HOSPITAL_COMMUNITY): Payer: Medicare Other | Attending: Hematology

## 2019-07-16 ENCOUNTER — Inpatient Hospital Stay (HOSPITAL_COMMUNITY): Payer: Medicare Other

## 2019-07-16 ENCOUNTER — Other Ambulatory Visit: Payer: Self-pay

## 2019-07-16 DIAGNOSIS — D751 Secondary polycythemia: Secondary | ICD-10-CM | POA: Diagnosis present

## 2019-07-16 LAB — COMPREHENSIVE METABOLIC PANEL
ALT: 34 U/L (ref 0–44)
AST: 38 U/L (ref 15–41)
Albumin: 4.4 g/dL (ref 3.5–5.0)
Alkaline Phosphatase: 56 U/L (ref 38–126)
Anion gap: 10 (ref 5–15)
BUN: 14 mg/dL (ref 8–23)
CO2: 30 mmol/L (ref 22–32)
Calcium: 8.9 mg/dL (ref 8.9–10.3)
Chloride: 99 mmol/L (ref 98–111)
Creatinine, Ser: 0.61 mg/dL (ref 0.61–1.24)
GFR calc Af Amer: >60 mL/min (ref >60)
GFR calc non Af Amer: >60 mL/min (ref >60)
Glucose, Bld: 107 mg/dL — ABNORMAL HIGH (ref 70–99)
Potassium: 4.6 mmol/L (ref 3.5–5.1)
Sodium: 139 mmol/L (ref 135–145)
Total Bilirubin: 0.9 mg/dL (ref 0.3–1.2)
Total Protein: 7.6 g/dL (ref 6.5–8.1)

## 2019-07-16 LAB — CBC WITH DIFFERENTIAL/PLATELET
Abs Immature Granulocytes: 0.02 10*3/uL (ref 0.00–0.07)
Basophils Absolute: 0.1 10*3/uL (ref 0.0–0.1)
Basophils Relative: 1 %
Eosinophils Absolute: 0.2 10*3/uL (ref 0.0–0.5)
Eosinophils Relative: 3 %
HCT: 56.5 % — ABNORMAL HIGH (ref 39.0–52.0)
Hemoglobin: 16.9 g/dL (ref 13.0–17.0)
Immature Granulocytes: 0 %
Lymphocytes Relative: 27 %
Lymphs Abs: 2 10*3/uL (ref 0.7–4.0)
MCH: 25 pg — ABNORMAL LOW (ref 26.0–34.0)
MCHC: 29.9 g/dL — ABNORMAL LOW (ref 30.0–36.0)
MCV: 83.6 fL (ref 80.0–100.0)
Monocytes Absolute: 1 10*3/uL (ref 0.1–1.0)
Monocytes Relative: 13 %
Neutro Abs: 4.1 10*3/uL (ref 1.7–7.7)
Neutrophils Relative %: 56 %
Platelets: 203 10*3/uL (ref 150–400)
RBC: 6.76 MIL/uL — ABNORMAL HIGH (ref 4.22–5.81)
RDW: 19.6 % — ABNORMAL HIGH (ref 11.5–15.5)
WBC: 7.4 10*3/uL (ref 4.0–10.5)
nRBC: 0 % (ref 0.0–0.2)

## 2019-07-16 NOTE — Progress Notes (Signed)
Philip Powell presents today for phlebotomy per MD orders. Phlebotomy procedure started at 1103 and ended at 1110 500 grams removed. Patient observed for 10 minutes after procedure without any incident. Patient stated he felt fine and was ready to go.  Patient tolerated procedure well. IV needle removed intact.   Vitals stable and discharged home from clinic ambulatory. Follow up as scheduled.

## 2019-07-28 ENCOUNTER — Ambulatory Visit: Payer: Medicare Other | Admitting: Cardiology

## 2019-08-09 ENCOUNTER — Encounter: Payer: Medicare Other | Admitting: Gastroenterology

## 2019-08-16 ENCOUNTER — Encounter: Payer: Self-pay | Admitting: Family Medicine

## 2019-08-16 DIAGNOSIS — G4733 Obstructive sleep apnea (adult) (pediatric): Secondary | ICD-10-CM | POA: Diagnosis not present

## 2019-08-19 ENCOUNTER — Ambulatory Visit: Payer: Medicare Other | Admitting: Gastroenterology

## 2019-08-20 DIAGNOSIS — J449 Chronic obstructive pulmonary disease, unspecified: Secondary | ICD-10-CM | POA: Diagnosis not present

## 2019-08-20 DIAGNOSIS — I2781 Cor pulmonale (chronic): Secondary | ICD-10-CM | POA: Diagnosis not present

## 2019-08-23 ENCOUNTER — Encounter (HOSPITAL_COMMUNITY): Payer: Self-pay

## 2019-08-23 ENCOUNTER — Other Ambulatory Visit (HOSPITAL_COMMUNITY): Payer: Self-pay | Admitting: *Deleted

## 2019-08-23 DIAGNOSIS — D500 Iron deficiency anemia secondary to blood loss (chronic): Secondary | ICD-10-CM

## 2019-08-23 DIAGNOSIS — D751 Secondary polycythemia: Secondary | ICD-10-CM

## 2019-08-24 ENCOUNTER — Other Ambulatory Visit (HOSPITAL_COMMUNITY): Payer: Self-pay | Admitting: *Deleted

## 2019-08-24 ENCOUNTER — Inpatient Hospital Stay (HOSPITAL_COMMUNITY): Payer: Medicare Other | Attending: Hematology

## 2019-08-24 ENCOUNTER — Other Ambulatory Visit: Payer: Self-pay

## 2019-08-24 DIAGNOSIS — D751 Secondary polycythemia: Secondary | ICD-10-CM | POA: Insufficient documentation

## 2019-08-24 DIAGNOSIS — D500 Iron deficiency anemia secondary to blood loss (chronic): Secondary | ICD-10-CM

## 2019-08-24 LAB — CBC WITH DIFFERENTIAL/PLATELET
Abs Immature Granulocytes: 0.03 10*3/uL (ref 0.00–0.07)
Basophils Absolute: 0.1 10*3/uL (ref 0.0–0.1)
Basophils Relative: 1 %
Eosinophils Absolute: 0.2 10*3/uL (ref 0.0–0.5)
Eosinophils Relative: 3 %
HCT: 55.9 % — ABNORMAL HIGH (ref 39.0–52.0)
Hemoglobin: 16.6 g/dL (ref 13.0–17.0)
Immature Granulocytes: 0 %
Lymphocytes Relative: 24 %
Lymphs Abs: 1.7 10*3/uL (ref 0.7–4.0)
MCH: 24.4 pg — ABNORMAL LOW (ref 26.0–34.0)
MCHC: 29.7 g/dL — ABNORMAL LOW (ref 30.0–36.0)
MCV: 82.1 fL (ref 80.0–100.0)
Monocytes Absolute: 1 10*3/uL (ref 0.1–1.0)
Monocytes Relative: 14 %
Neutro Abs: 4.3 10*3/uL (ref 1.7–7.7)
Neutrophils Relative %: 58 %
Platelets: 156 10*3/uL (ref 150–400)
RBC: 6.81 MIL/uL — ABNORMAL HIGH (ref 4.22–5.81)
RDW: 20.5 % — ABNORMAL HIGH (ref 11.5–15.5)
WBC: 7.3 10*3/uL (ref 4.0–10.5)
nRBC: 0 % (ref 0.0–0.2)

## 2019-08-24 LAB — COMPREHENSIVE METABOLIC PANEL
ALT: 36 U/L (ref 0–44)
AST: 39 U/L (ref 15–41)
Albumin: 4.5 g/dL (ref 3.5–5.0)
Alkaline Phosphatase: 50 U/L (ref 38–126)
Anion gap: 10 (ref 5–15)
BUN: 14 mg/dL (ref 8–23)
CO2: 33 mmol/L — ABNORMAL HIGH (ref 22–32)
Calcium: 9.6 mg/dL (ref 8.9–10.3)
Chloride: 97 mmol/L — ABNORMAL LOW (ref 98–111)
Creatinine, Ser: 0.74 mg/dL (ref 0.61–1.24)
GFR calc Af Amer: >60 mL/min (ref >60)
GFR calc non Af Amer: >60 mL/min (ref >60)
Glucose, Bld: 122 mg/dL — ABNORMAL HIGH (ref 70–99)
Potassium: 4.6 mmol/L (ref 3.5–5.1)
Sodium: 140 mmol/L (ref 135–145)
Total Bilirubin: 1.2 mg/dL (ref 0.3–1.2)
Total Protein: 7.4 g/dL (ref 6.5–8.1)

## 2019-08-30 ENCOUNTER — Inpatient Hospital Stay (HOSPITAL_COMMUNITY): Payer: Medicare Other | Attending: Hematology

## 2019-08-30 ENCOUNTER — Other Ambulatory Visit: Payer: Self-pay

## 2019-08-30 DIAGNOSIS — G4730 Sleep apnea, unspecified: Secondary | ICD-10-CM | POA: Diagnosis not present

## 2019-08-30 DIAGNOSIS — D751 Secondary polycythemia: Secondary | ICD-10-CM | POA: Insufficient documentation

## 2019-08-30 DIAGNOSIS — E119 Type 2 diabetes mellitus without complications: Secondary | ICD-10-CM | POA: Diagnosis not present

## 2019-08-30 DIAGNOSIS — G8929 Other chronic pain: Secondary | ICD-10-CM | POA: Insufficient documentation

## 2019-08-30 DIAGNOSIS — D696 Thrombocytopenia, unspecified: Secondary | ICD-10-CM | POA: Diagnosis not present

## 2019-08-30 DIAGNOSIS — Z809 Family history of malignant neoplasm, unspecified: Secondary | ICD-10-CM | POA: Diagnosis not present

## 2019-08-30 DIAGNOSIS — R05 Cough: Secondary | ICD-10-CM | POA: Diagnosis not present

## 2019-08-30 DIAGNOSIS — F17210 Nicotine dependence, cigarettes, uncomplicated: Secondary | ICD-10-CM | POA: Insufficient documentation

## 2019-08-30 DIAGNOSIS — R0602 Shortness of breath: Secondary | ICD-10-CM | POA: Diagnosis not present

## 2019-08-30 DIAGNOSIS — K219 Gastro-esophageal reflux disease without esophagitis: Secondary | ICD-10-CM | POA: Insufficient documentation

## 2019-08-30 DIAGNOSIS — Z79899 Other long term (current) drug therapy: Secondary | ICD-10-CM | POA: Diagnosis not present

## 2019-08-30 DIAGNOSIS — Z7982 Long term (current) use of aspirin: Secondary | ICD-10-CM | POA: Diagnosis not present

## 2019-08-30 DIAGNOSIS — F418 Other specified anxiety disorders: Secondary | ICD-10-CM | POA: Insufficient documentation

## 2019-08-30 DIAGNOSIS — R5383 Other fatigue: Secondary | ICD-10-CM | POA: Diagnosis not present

## 2019-08-30 DIAGNOSIS — J449 Chronic obstructive pulmonary disease, unspecified: Secondary | ICD-10-CM | POA: Diagnosis not present

## 2019-08-30 DIAGNOSIS — E669 Obesity, unspecified: Secondary | ICD-10-CM | POA: Diagnosis not present

## 2019-08-30 NOTE — Patient Instructions (Signed)
[facility]  Discharge Instructions:   _______________________________________________________________  Thank you for choosing [facility] at [facility] to provide your oncology and hematology care.  To afford each patient quality time with our providers, please arrive at least 15 minutes before your scheduled appointment.  You need to re-schedule your appointment if you arrive 10 or more minutes late.  We strive to give you quality time with our providers, and arriving late affects you and other patients whose appointments are after yours.  Also, if you no show three or more times for appointments you may be dismissed from the clinic.  Again, thank you for choosing [facility] at [facility]. Our hope is that these requests will allow you access to exceptional care and in a timely manner. _______________________________________________________________  If you have questions after your visit, please contact our office at [phone] between the hours of 8:30 a.m. and 5:00 p.m. Voicemails left after 4:30 p.m. will not be returned until the following business day. _______________________________________________________________  For prescription refill requests, have your pharmacy contact our office. _______________________________________________________________  Recommendations made by the consultant and any test results will be sent to your referring physician. _______________________________________________________________ 

## 2019-08-30 NOTE — Progress Notes (Signed)
Philip Powell presents today for phlebotomy per MD orders. Phlebotomy procedure started at 13:35 and ended at 13:45 500cc removed. Patient tolerated procedure well. IV needle removed intact. Pressure applied. Treatment given today per MD orders. Tolerated  without adverse affects. Vital signs stable. No complaints at this time. Discharged from clinic ambulatory. F/U with [facility] as scheduled.

## 2019-08-31 ENCOUNTER — Encounter (HOSPITAL_COMMUNITY): Payer: Self-pay

## 2019-09-02 ENCOUNTER — Other Ambulatory Visit (HOSPITAL_COMMUNITY): Payer: Medicare Other

## 2019-09-06 ENCOUNTER — Encounter: Payer: Self-pay | Admitting: Family Medicine

## 2019-09-09 ENCOUNTER — Encounter (HOSPITAL_COMMUNITY): Payer: Self-pay | Admitting: Nurse Practitioner

## 2019-09-09 ENCOUNTER — Other Ambulatory Visit: Payer: Self-pay

## 2019-09-09 ENCOUNTER — Inpatient Hospital Stay (HOSPITAL_BASED_OUTPATIENT_CLINIC_OR_DEPARTMENT_OTHER): Payer: Medicare Other | Admitting: Nurse Practitioner

## 2019-09-09 DIAGNOSIS — D696 Thrombocytopenia, unspecified: Secondary | ICD-10-CM | POA: Diagnosis not present

## 2019-09-09 DIAGNOSIS — R0602 Shortness of breath: Secondary | ICD-10-CM | POA: Diagnosis not present

## 2019-09-09 DIAGNOSIS — K219 Gastro-esophageal reflux disease without esophagitis: Secondary | ICD-10-CM | POA: Diagnosis not present

## 2019-09-09 DIAGNOSIS — R05 Cough: Secondary | ICD-10-CM | POA: Diagnosis not present

## 2019-09-09 DIAGNOSIS — R5383 Other fatigue: Secondary | ICD-10-CM | POA: Diagnosis not present

## 2019-09-09 DIAGNOSIS — D751 Secondary polycythemia: Secondary | ICD-10-CM

## 2019-09-09 DIAGNOSIS — Z7982 Long term (current) use of aspirin: Secondary | ICD-10-CM | POA: Diagnosis not present

## 2019-09-09 DIAGNOSIS — Z809 Family history of malignant neoplasm, unspecified: Secondary | ICD-10-CM | POA: Diagnosis not present

## 2019-09-09 DIAGNOSIS — G8929 Other chronic pain: Secondary | ICD-10-CM | POA: Diagnosis not present

## 2019-09-09 DIAGNOSIS — J449 Chronic obstructive pulmonary disease, unspecified: Secondary | ICD-10-CM | POA: Diagnosis not present

## 2019-09-09 DIAGNOSIS — E119 Type 2 diabetes mellitus without complications: Secondary | ICD-10-CM | POA: Diagnosis not present

## 2019-09-09 DIAGNOSIS — Z79899 Other long term (current) drug therapy: Secondary | ICD-10-CM | POA: Diagnosis not present

## 2019-09-09 DIAGNOSIS — G4730 Sleep apnea, unspecified: Secondary | ICD-10-CM | POA: Diagnosis not present

## 2019-09-09 NOTE — Assessment & Plan Note (Addendum)
1.  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 Earlie Server in March 2016 for polycythemia. -Work-up included erythroprotein was 18.1, Jak 2, CALR, MPL were all negative.  UA was negative for hematuria.  Carboxyhemoglobin was elevated at 2.5. -Etiology of erythrocytosis includes sleep apnea, testosterone supplements, and Lasix.  He is also a current active smoker, 1 pack/per day for 50 years. -Last phlebotomy was on 08/30/2019. -Patient denies any fevers, night sweats, or weight loss.  Denies any vasomotor symptoms or aquagenic pruritus.  No prior history of thrombus. -Labs on 08/24/2019 showed hemoglobin 16.6 and hematocrit 55.9. -He has been getting phlebotomies every 6 to 7 weeks.  We will continue this. -He usually gets chest fullness and feels funny in the head when he requires a phlebotomy. -He will follow back up with [place] in 4 months with repeat labs.  2.  Tobacco abuse: -He smokes 1 pack/day for 50 years. -CT lung screening protocol on 04/21/2019 which was lung RADS 2.  Annual screenings was recommended in 12 months. -It was also discussed with him about other findings including dilatation of pulmonary trunk, 4.5 cm in diameter concerning for pulmonary artery hypertension.  He will discuss this with his cardiologist.  3.  Intermittent mild thrombocytopenia: -He has intermittent mild thrombocytopenia since February 2020. -Labs last done on 08/24/2019 showed platelet count of 156.

## 2019-09-09 NOTE — Progress Notes (Signed)
[place], [place] [address]   CLINIC:  Medical Oncology/Hematology  PCP:  Loman [place], [place] [address] [phone]   REASON FOR VISIT: Follow-up for polycythemia  CURRENT THERAPY: Intermittent phlebotomies   INTERVAL HISTORY:  Philip Powell 69 y.o. male returns for routine follow-up for polycythemia.  Patient reports he just had a phlebotomy last week and he is feeling great at this moment.  He denies any aqua genic pruritus.  He denies any hot flashes night sweats or weight loss. Denies any nausea, vomiting, or diarrhea. Denies any new pains. Had not noticed any recent bleeding such as epistaxis, hematuria or hematochezia. Denies recent chest pain on exertion, shortness of breath on minimal exertion, pre-syncopal episodes, or palpitations. Denies any numbness or tingling in hands or feet. Denies any recent fevers, infections, or recent hospitalizations. Patient reports appetite at 100% and energy level at 25%.  He is eating well maintain his weight at this time.    REVIEW OF SYSTEMS:  Review of Systems  Constitutional: Positive for fatigue.  Respiratory: Positive for cough and shortness of breath.   Psychiatric/Behavioral: Positive for depression. The patient is nervous/anxious.   All other systems reviewed and are negative.    PAST MEDICAL/SURGICAL HISTORY:  Past Medical History:  Diagnosis Date  . Chronic pain   . COPD (chronic obstructive pulmonary disease) ([place])   . Cor pulmonale, chronic ([place])   . GERD (gastroesophageal reflux disease)   . History of diabetes mellitus    A1c 6.5 on 01/17/17; 6.3 on 03/25/2019  . History of pneumonia   . History of seizure   . Obesity   . Obstructive sleep apnea    CPAP  . Polycythemia    Past Surgical History:  Procedure Laterality Date  . CARPAL TUNNEL RELEASE Right 2008  . NEUROPLASTY / TRANSPOSITION MEDIAN NERVE AT CARPAL TUNNEL Bilateral   . VASECTOMY         SOCIAL HISTORY:  Social History   Socioeconomic History  . Marital status: Married    Spouse name: Not on file  . Number of children: 3  . Years of education: Not on file  . Highest education level: Not on file  Occupational History  . Occupation: lpn    Comment: retired  Tobacco Use  . Smoking status: Current Every Day Smoker    Packs/day: 1.00    Years: 43.00    Pack years: 43.00    Types: Cigarettes  . Smokeless tobacco: Never Used  . Tobacco comment: 0.5  Substance and Sexual Activity  . Alcohol use: Yes    Alcohol/week: 0.0 standard drinks    Comment: occasionally  . Drug use: No  . Sexual activity: Not Currently  Other Topics Concern  . Not on file  Social History Narrative  . Not on file   Social Determinants of Health   Financial Resource Strain:   . Difficulty of Paying Living Expenses: Not on file  Food Insecurity:   . Worried About Charity fundraiser in the Last Year: Not on file  . Ran Out of Food in the Last Year: Not on file  Transportation Needs:   . Lack of Transportation (Medical): Not on file  . Lack of Transportation (Non-Medical): Not on file  Physical Activity:   . Days of Exercise per Week: Not on file  . Minutes of Exercise per Session: Not on file  Stress:   . Feeling of Stress :  Not on file  Social Connections:   . Frequency of Communication with Friends and Family: Not on file  . Frequency of Social Gatherings with Friends and Family: Not on file  . Attends Religious Services: Not on file  . Active Member of Clubs or Organizations: Not on file  . Attends Archivist Meetings: Not on file  . Marital Status: Not on file  Intimate Partner Violence:   . Fear of Current or Ex-Partner: Not on file  . Emotionally Abused: Not on file  . Physically Abused: Not on file  . Sexually Abused: Not on file    FAMILY HISTORY:  Family History  Problem Relation Age of Onset  . Cancer Brother   . Heart disease Maternal  Grandfather   . Heart attack Maternal Grandfather 55  . Cancer Mother   . Cancer Father   . Hypertension Son   . Cancer Sister   . Cancer Brother   . Lupus Sister     CURRENT MEDICATIONS:  Outpatient Encounter Medications as of 09/09/2019  Medication Sig  . Ascorbic Acid (VITAMIN C PO) Take 1 tablet by mouth daily.  Marland Kitchen aspirin 325 MG tablet Take 325 mg by mouth daily.  Marland Kitchen atorvastatin (LIPITOR) 10 MG tablet Take 10 mg by mouth daily.  . DULoxetine (CYMBALTA) 30 MG capsule Take 1 capsule (30 mg total) by mouth 2 (two) times daily.  . famotidine (PEPCID) 20 MG tablet Take 20 mg by mouth daily.  Marland Kitchen HYDROcodone-acetaminophen (NORCO) 7.5-325 MG tablet Take 1 tablet by mouth 3 (three) times daily as needed for moderate pain.  . Multiple Vitamins-Minerals (CENTRUM ADULTS) TABS Take 1 tablet by mouth daily.  . OXYGEN Inhale 3 L into the lungs continuous.  Marland Kitchen testosterone cypionate (DEPOTESTOSTERONE CYPIONATE) 200 MG/ML injection Inject 1 mL (200 mg total) into the muscle every 14 (fourteen) days.  . [DISCONTINUED] Fluticasone-Umeclidin-Vilant (TRELEGY ELLIPTA) 100-62.5-25 MCG/INH AEPB Inhale 1 puff into the lungs daily.  Marland Kitchen albuterol (VENTOLIN HFA) 108 (90 Base) MCG/ACT inhaler Inhale 2 puffs into the lungs every 6 (six) hours as needed for wheezing or shortness of breath. (Patient not taking: Reported on 09/09/2019)  . furosemide (LASIX) 40 MG tablet Take 0.5 tablets (20 mg total) by mouth daily. (Patient not taking: Reported on 09/09/2019)  . ibuprofen (ADVIL) 200 MG tablet Take 400 mg by mouth every 6 (six) hours as needed for mild pain.  Marland Kitchen ipratropium-albuterol (DUONEB) 0.5-2.5 (3) MG/3ML SOLN Take 3 mLs by nebulization as needed.  . loratadine (CLARITIN) 10 MG tablet Take 10 mg by mouth daily as needed for allergies.  . [DISCONTINUED] HYDROcodone-acetaminophen (NORCO) 7.5-325 MG tablet Take 1 tablet by mouth 3 (three) times daily as needed for moderate pain.  . [DISCONTINUED]  HYDROcodone-acetaminophen (NORCO) 7.5-325 MG tablet Take 1 tablet by mouth 3 (three) times daily as needed for moderate pain.  . [DISCONTINUED] Multiple Vitamins-Minerals (CENTRUM ADULTS) TABS Take by mouth.  . [DISCONTINUED] testosterone cypionate (DEPOTESTOTERONE CYPIONATE) 100 MG/ML injection Inject into the muscle.   No facility-administered encounter medications on file as of 09/09/2019.    ALLERGIES:  No Known Allergies   PHYSICAL EXAM:  ECOG Performance status: 1  Vitals:   09/09/19 1357  BP: 116/74  Pulse: 99  Resp: 20  Temp: 97.7 F (36.5 C)  SpO2: 94%   Filed Weights   09/09/19 1357  Weight: (!) 327 lb 1.6 oz (148.4 kg)    Physical Exam Constitutional:      Appearance: He is obese.  Abdominal:  General: Bowel sounds are normal.     Palpations: Abdomen is soft.  Musculoskeletal:        General: Normal range of motion.  Skin:    General: Skin is warm.  Neurological:     Mental Status: He is alert and oriented to person, place, and time. Mental status is at baseline.  Psychiatric:        Mood and Affect: Mood normal.        Behavior: Behavior normal.        Thought Content: Thought content normal.        Judgment: Judgment normal.      LABORATORY DATA:  I have reviewed the labs as listed.  CBC    Component Value Date/Time   WBC 7.3 08/24/2019 1016   RBC 6.81 (H) 08/24/2019 1016   HGB 16.6 08/24/2019 1016   HGB 17.9 (H) 03/25/2019 1422   HGB 15.5 12/16/2014 0911   HCT 55.9 (H) 08/24/2019 1016   HCT 57.7 (H) 03/25/2019 1422   HCT 47.9 12/16/2014 0911   PLT 156 08/24/2019 1016   PLT 118 (L) 03/25/2019 1422   MCV 82.1 08/24/2019 1016   MCV 82 03/25/2019 1422   MCV 92.5 12/16/2014 0911   MCH 24.4 (L) 08/24/2019 1016   MCHC 29.7 (L) 08/24/2019 1016   RDW 20.5 (H) 08/24/2019 1016   RDW 23.0 (H) 03/25/2019 1422   RDW 14.7 (H) 12/16/2014 0911   LYMPHSABS 1.7 08/24/2019 1016   LYMPHSABS 2.0 03/25/2019 1422   LYMPHSABS 2.2 12/16/2014 0911    MONOABS 1.0 08/24/2019 1016   MONOABS 1.4 (H) 12/16/2014 0911   EOSABS 0.2 08/24/2019 1016   EOSABS 0.2 03/25/2019 1422   BASOSABS 0.1 08/24/2019 1016   BASOSABS 0.1 03/25/2019 1422   BASOSABS 0.1 12/16/2014 0911   CMP Latest Ref Rng & Units 08/24/2019 07/16/2019 05/27/2019  Glucose 70 - 99 mg/dL 122(H) 107(H) 102(H)  BUN 8 - 23 mg/dL 14 14 16   Creatinine 0.61 - 1.24 mg/dL 0.74 0.61 0.67  Sodium 135 - 145 mmol/L 140 139 138  Potassium 3.5 - 5.1 mmol/L 4.6 4.6 4.4  Chloride 98 - 111 mmol/L 97(L) 99 100  CO2 22 - 32 mmol/L 33(H) 30 27  Calcium 8.9 - 10.3 mg/dL 9.6 8.9 9.3  Total Protein 6.5 - 8.1 g/dL 7.4 7.6 7.5  Total Bilirubin 0.3 - 1.2 mg/dL 1.2 0.9 1.5(H)  Alkaline Phos 38 - 126 U/L 50 56 49  AST 15 - 41 U/L 39 38 39  ALT 0 - 44 U/L 36 34 34     I personally performed a face-to-face visit.  All questions were answered to patient's stated satisfaction. Encouraged patient to call with any new concerns or questions before his next visit to the cancer center and we can certain see him sooner, if needed.     ASSESSMENT & PLAN:   Polycythemia 1.  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 Earlie Server in March 2016 for polycythemia. -Work-up included erythroprotein was 18.1, Jak 2, CALR, MPL were all negative.  UA was negative for hematuria.  Carboxyhemoglobin was elevated at 2.5. -Etiology of erythrocytosis includes sleep apnea, testosterone supplements, and Lasix.  He is also a current active smoker, 1 pack/per day for 50 years. -Last phlebotomy was on 08/30/2019. -Patient denies any fevers, night sweats, or weight loss.  Denies any vasomotor symptoms or aquagenic pruritus.  No prior history of thrombus. -Labs on 08/24/2019 showed hemoglobin 16.6 and hematocrit 55.9. -He has been getting phlebotomies every  6 to 7 weeks.  We will continue this. -He usually gets chest fullness and feels funny in the head when he requires a phlebotomy. -He will follow back up with [place] in 4 months with  repeat labs.  2.  Tobacco abuse: -He smokes 1 pack/day for 50 years. -CT lung screening protocol on 04/21/2019 which was lung RADS 2.  Annual screenings was recommended in 12 months. -It was also discussed with him about other findings including dilatation of pulmonary trunk, 4.5 cm in diameter concerning for pulmonary artery hypertension.  He will discuss this with his cardiologist.  3.  Intermittent mild thrombocytopenia: -He has intermittent mild thrombocytopenia since February 2020. -Labs last done on 08/24/2019 showed platelet count of 156.      Orders placed this encounter:  Orders Placed This Encounter  Procedures  . Phlebotomy therapeutic  . Phlebotomy therapeutic  . Phlebotomy therapeutic  . Lactate dehydrogenase  . CBC with Differential/Platelet  . Comprehensive metabolic panel      Francene Finders, FNP-C [place] [phone]

## 2019-09-09 NOTE — Patient Instructions (Signed)
[place] at [facility] Discharge Instructions  RTC in 4 months with lab s   Thank you for choosing [place] at [facility] to provide your oncology and hematology care.  To afford each patient quality time with our provider, please arrive at least 15 minutes before your scheduled appointment time.   If you have a lab appointment with the [facility] please come in thru the Main Entrance and check in at the main information desk.  You need to re-schedule your appointment should you arrive 10 or more minutes late.  We strive to give you quality time with our providers, and arriving late affects you and other patients whose appointments are after yours.  Also, if you no show three or more times for appointments you may be dismissed from the clinic at the providers discretion.     Again, thank you for choosing [facility].  Our hope is that these requests will decrease the amount of time that you wait before being seen by our physicians.       _____________________________________________________________  Should you have questions after your visit to [facility], please contact our office at [phone] between the hours of 8:00 a.m. and 4:30 p.m.  Voicemails left after 4:00 p.m. will not be returned until the following business day.  For prescription refill requests, have your pharmacy contact our office and allow 72 hours.    Due to Covid, you will need to wear a mask upon entering the hospital. If you do not have a mask, a mask will be given to you at the Main Entrance upon arrival. For doctor visits, patients may have 1 support person with them. For treatment visits, patients can not have anyone with them due to social distancing guidelines and our immunocompromised population.

## 2019-09-15 ENCOUNTER — Other Ambulatory Visit: Payer: Self-pay

## 2019-09-15 ENCOUNTER — Ambulatory Visit (INDEPENDENT_AMBULATORY_CARE_PROVIDER_SITE_OTHER): Payer: Medicare Other | Admitting: Family Medicine

## 2019-09-15 ENCOUNTER — Encounter: Payer: Self-pay | Admitting: Family Medicine

## 2019-09-15 VITALS — BP 134/78 | HR 99 | Temp 96.5°F | Ht 72.0 in | Wt 328.0 lb

## 2019-09-15 DIAGNOSIS — R7303 Prediabetes: Secondary | ICD-10-CM

## 2019-09-15 DIAGNOSIS — I7090 Unspecified atherosclerosis: Secondary | ICD-10-CM

## 2019-09-15 DIAGNOSIS — Z79899 Other long term (current) drug therapy: Secondary | ICD-10-CM | POA: Diagnosis not present

## 2019-09-15 DIAGNOSIS — M180 Bilateral primary osteoarthritis of first carpometacarpal joints: Secondary | ICD-10-CM | POA: Diagnosis not present

## 2019-09-15 DIAGNOSIS — M47812 Spondylosis without myelopathy or radiculopathy, cervical region: Secondary | ICD-10-CM

## 2019-09-15 DIAGNOSIS — R7989 Other specified abnormal findings of blood chemistry: Secondary | ICD-10-CM

## 2019-09-15 DIAGNOSIS — L821 Other seborrheic keratosis: Secondary | ICD-10-CM

## 2019-09-15 DIAGNOSIS — L989 Disorder of the skin and subcutaneous tissue, unspecified: Secondary | ICD-10-CM | POA: Diagnosis not present

## 2019-09-15 DIAGNOSIS — F321 Major depressive disorder, single episode, moderate: Secondary | ICD-10-CM

## 2019-09-15 DIAGNOSIS — D751 Secondary polycythemia: Secondary | ICD-10-CM

## 2019-09-15 NOTE — Patient Instructions (Signed)
   Seborrheic Keratosis °A seborrheic keratosis is a common, noncancerous (benign) skin growth. These growths are velvety, waxy, rough, tan, brown, or black spots that appear on the skin. These skin growths can be flat or raised, and scaly. °What are the causes? °The cause of this condition is not known. °What increases the risk? °You are more likely to develop this condition if you: °· Have a family history of seborrheic keratosis. °· Are 50 or older. °· Are pregnant. °· Have had estrogen replacement therapy. °What are the signs or symptoms? °Symptoms of this condition include growths on the face, chest, shoulders, back, or other areas. These growths: °· Are usually painless, but may become irritated and itchy. °· Can be yellow, brown, black, or other colors. °· Are slightly raised or have a flat surface. °· Are sometimes rough or wart-like in texture. °· Are often velvety or waxy on the surface. °· Are round or oval-shaped. °· Often occur in groups, but may occur as a single growth. °How is this diagnosed? °This condition is diagnosed with a medical history and physical exam. °· A sample of the growth may be tested (skin biopsy). °· You may need to see a skin specialist (dermatologist). °How is this treated? °Treatment is not usually needed for this condition, unless the growths are irritated or bleed often. °· You may also choose to have the growths removed if you do not like their appearance. °? Most commonly, these growths are treated with a procedure in which liquid nitrogen is applied to "freeze" off the growth (cryosurgery). °? They may also be burned off with electricity (electrocautery) or removed by scraping (curettage). °Follow these instructions at home: °· Watch your growth for any changes. °· Keep all follow-up visits as told by your health care provider. This is important. °· Do not scratch or pick at the growth or growths. This can cause them to become irritated or infected. °Contact a health care  provider if: °· You suddenly have many new growths. °· Your growth bleeds, itches, or hurts. °· Your growth suddenly becomes larger or changes color. °Summary °· A seborrheic keratosis is a common, noncancerous (benign) skin growth. °· Treatment is not usually needed for this condition, unless the growths are irritated or bleed often. °· Watch your growth for any changes. °· Contact a health care provider if you suddenly have many new growths or your growth suddenly becomes larger or changes color. °· Keep all follow-up visits as told by your health care provider. This is important. °This information is not intended to replace advice given to you by your health care provider. Make sure you discuss any questions you have with your health care provider. °Document Revised: 11/27/2017 Document Reviewed: 11/27/2017 °Elsevier Patient Education © 2020 [employer] ° °

## 2019-09-15 NOTE — Progress Notes (Signed)
Assessment & Plan:  1-3. Primary osteoarthritis of both first carpometacarpal joints/Cervical spondylosis without myelopathy/Controlled substance agreement signed - Well controlled on current regimen. Only two additional refills of Norco sent to pharmacy as patient should still have one there to be filled later this month.  - HYDROcodone-acetaminophen (NORCO) 7.5-325 MG tablet; Take 1 tablet by mouth 3 (three) times daily as needed for moderate pain.  Dispense: 90 tablet; Refill: 0 - HYDROcodone-acetaminophen (NORCO) 7.5-325 MG tablet; Take 1 tablet by mouth 3 (three) times daily as needed for moderate pain.  Dispense: 90 tablet; Refill: 0  4. Prediabetes - Lab order printed and given to patient to have completed with his next labs at the cancer center. - Hemoglobin A1c; Future  5. Skin lesion of scalp - Ambulatory referral to Dermatology  6. Seborrheic keratosis - Education provided on seborrheic keratosis.  7. Atherosclerosis - Encouraged patient to resume atorvastatin.  8. Depression, major, single episode, moderate (Philip Powell) - Patient will start taking Cymbalta when he is ready.  9. Low testosterone - Well controlled on current regimen.   10. Polycythemia - Managed by hematology. He gets scheduled phlebotomies.   Return in about 3 months (around 12/13/2019) for follow-up of chronic medication conditions.  Philip Limes, MSN, APRN, FNP-C [facility]  Subjective:    Patient ID: Philip Powell, male    DOB: [birth year], 69 y.o.   MRN: [id number]  Patient Care Team: Philip Brooklyn, FNP as PCP - General (Family Medicine) Philip Sark, MD as PCP - Cardiology (Cardiology) Philip Sark, MD as Consulting Physician (Cardiology) Philip Lever, MD as Consulting Physician (Pulmonary Disease)   Chief Complaint:  Chief Complaint  Patient presents with  . Skin Problem    WANTS SPOTS LOOKED AT     HPI: Philip Powell is a 69 y.o.  male presenting on 09/15/2019 for Skin Problem (WANTS SPOTS LOOKED AT )  Patient reports he is no longer taking the atorvastatin 10 mg and cannot give me a reason why.  At our last visit on 06/29/2019 patient felt he could benefit from something to treat depression; Cymbalta was ordered.  He reports today he never started taking it.  Patient reports he is afraid of experiencing an alteration in his consciousness if he takes the medication.  Pain assessment: Cause of pain- degeneratives changes/arthritis in hands; degenerative changes, foraminal narrowing, and joint space disk narrowing with osteophyte formation especially at C5-6; increased size of nerve in wrists Pain location- bilateral hands and neck Pain on scale of 1-10- 2-3/10 with medication Frequency- daily What increases pain- cold, increased activiity What makes pain better- pain medication Effects on ADL - makes difficult but gets through it Any change in general medical condition- No  Current opioids rx- Norco 7.5/325 TID PRN # meds rx- 90 Effectiveness of current meds- effective Adverse reactions form pain meds- none Morphine equivalent- 22.50  Pill count performed- No Last drug screen - 03/25/19 ( high risk q24m moderate risk q634mlow risk yearly ) Urine drug screen today- No [place]- Yes             If yes were their any concerning findings? - No  Pain contract signed on: 03/25/2019   New complaints: Patient reports a nonconcentric dark spot on the top of his head that has been present for more than 3 months.  Denies any itching or bleeding.  Patient is nervous because he has a family history of skin cancer.  He is interested in seeing a dermatologist.  He reports he used to see Dr. Denna Haggard in [place], but that office is now closed.  Social history:  Relevant past medical, surgical, family and social history reviewed and updated as indicated. Interim medical history since our last visit  reviewed.  Allergies and medications reviewed and updated.  DATA REVIEWED: CHART IN EPIC  ROS: Negative unless specifically indicated above in HPI.    Current Outpatient Medications:  .  albuterol (VENTOLIN HFA) 108 (90 Base) MCG/ACT inhaler, Inhale 2 puffs into the lungs every 6 (six) hours as needed for wheezing or shortness of breath., Disp: 1 Inhaler, Rfl: 0 .  Ascorbic Acid (VITAMIN C PO), Take 1 tablet by mouth daily., Disp: , Rfl:  .  aspirin 325 MG tablet, Take 325 mg by mouth daily., Disp: , Rfl:  .  atorvastatin (LIPITOR) 10 MG tablet, Take 10 mg by mouth daily., Disp: , Rfl:  .  DULoxetine (CYMBALTA) 30 MG capsule, Take 1 capsule (30 mg total) by mouth 2 (two) times daily., Disp: 60 capsule, Rfl: 2 .  famotidine (PEPCID) 20 MG tablet, Take 20 mg by mouth daily., Disp: , Rfl:  .  furosemide (LASIX) 40 MG tablet, Take 0.5 tablets (20 mg total) by mouth daily., Disp: 30 tablet, Rfl: 0 .  HYDROcodone-acetaminophen (NORCO) 7.5-325 MG tablet, Take 1 tablet by mouth 3 (three) times daily as needed for moderate pain., Disp: 90 tablet, Rfl: 0 .  ibuprofen (ADVIL) 200 MG tablet, Take 400 mg by mouth every 6 (six) hours as needed for mild pain., Disp: , Rfl:  .  ipratropium-albuterol (DUONEB) 0.5-2.5 (3) MG/3ML SOLN, Take 3 mLs by nebulization as needed., Disp: , Rfl:  .  loratadine (CLARITIN) 10 MG tablet, Take 10 mg by mouth daily as needed for allergies., Disp: , Rfl:  .  Multiple Vitamins-Minerals (CENTRUM ADULTS) TABS, Take 1 tablet by mouth daily., Disp: , Rfl:  .  OXYGEN, Inhale 3 L into the lungs continuous., Disp: , Rfl:  .  testosterone cypionate (DEPOTESTOSTERONE CYPIONATE) 200 MG/ML injection, Inject 1 mL (200 mg total) into the muscle every 14 (fourteen) days., Disp: 10 mL, Rfl: 1   No Known Allergies Past Medical History:  Diagnosis Date  . Chronic pain   . COPD (chronic obstructive pulmonary disease) ([place])   . Cor pulmonale, chronic ([place])   . GERD (gastroesophageal  reflux disease)   . History of diabetes mellitus    A1c 6.5 on 01/17/17; 6.3 on 03/25/2019  . History of pneumonia   . History of seizure   . Obesity   . Obstructive sleep apnea    CPAP  . Polycythemia     Past Surgical History:  Procedure Laterality Date  . CARPAL TUNNEL RELEASE Right 2008  . NEUROPLASTY / TRANSPOSITION MEDIAN NERVE AT CARPAL TUNNEL Bilateral   . VASECTOMY      Social History   Socioeconomic History  . Marital status: Married    Spouse name: Not on file  . Number of children: 3  . Years of education: Not on file  . Highest education level: Not on file  Occupational History  . Occupation: lpn    Comment: retired  Tobacco Use  . Smoking status: Current Every Day Smoker    Packs/day: 1.00    Years: 43.00    Pack years: 43.00    Types: Cigarettes  . Smokeless tobacco: Never Used  . Tobacco comment: 0.5  Substance and Sexual Activity  . Alcohol use:  Yes    Alcohol/week: 0.0 standard drinks    Comment: occasionally  . Drug use: No  . Sexual activity: Not Currently  Other Topics Concern  . Not on file  Social History Narrative  . Not on file   Social Determinants of Health   Financial Resource Strain:   . Difficulty of Paying Living Expenses: Not on file  Food Insecurity:   . Worried About Charity fundraiser in the Last Year: Not on file  . Ran Out of Food in the Last Year: Not on file  Transportation Needs:   . Lack of Transportation (Medical): Not on file  . Lack of Transportation (Non-Medical): Not on file  Physical Activity:   . Days of Exercise per Week: Not on file  . Minutes of Exercise per Session: Not on file  Stress:   . Feeling of Stress : Not on file  Social Connections:   . Frequency of Communication with Friends and Family: Not on file  . Frequency of Social Gatherings with Friends and Family: Not on file  . Attends Religious Services: Not on file  . Active Member of Clubs or Organizations: Not on file  . Attends Theatre manager Meetings: Not on file  . Marital Status: Not on file  Intimate Partner Violence:   . Fear of Current or Ex-Partner: Not on file  . Emotionally Abused: Not on file  . Physically Abused: Not on file  . Sexually Abused: Not on file        Objective:    BP 134/78   Pulse 99   Temp (!) 96.5 F (35.8 C) (Temporal)   Ht 6' (1.829 m)   Wt (!) 328 lb (148.8 kg)   SpO2 92%   BMI 44.48 kg/m   Physical Exam Vitals reviewed.  Constitutional:      General: He is not in acute distress.    Appearance: Normal appearance. He is morbidly obese. He is not ill-appearing, toxic-appearing or diaphoretic.  HENT:     Head: Normocephalic and atraumatic.  Eyes:     General: No scleral icterus.       Right eye: No discharge.        Left eye: No discharge.     Conjunctiva/sclera: Conjunctivae normal.  Cardiovascular:     Rate and Rhythm: Normal rate and regular rhythm.     Heart sounds: Normal heart sounds. No murmur. No friction rub. No gallop.   Pulmonary:     Effort: Pulmonary effort is normal. No respiratory distress.     Breath sounds: Normal breath sounds. No stridor. No wheezing, rhonchi or rales.  Musculoskeletal:        General: Normal range of motion.     Cervical back: Normal range of motion.  Skin:    General: Skin is warm and dry.     Findings: Lesion (seborrheic keratosis right upper chest) present.     Comments: Nonconcentric brown area to the top of patients scalp on right side (pictured below).   Neurological:     Mental Status: He is alert and oriented to person, place, and time. Mental status is at baseline.  Psychiatric:        Mood and Affect: Mood normal.        Behavior: Behavior normal.        Thought Content: Thought content normal.        Judgment: Judgment normal.    Lab Results  Component Value Date   TSH 1.262  12/15/2018   Lab Results  Component Value Date   WBC 7.3 08/24/2019   HGB 16.6 08/24/2019   HCT 55.9 (H) 08/24/2019   MCV 82.1  08/24/2019   PLT 156 08/24/2019   Lab Results  Component Value Date   NA 140 08/24/2019   K 4.6 08/24/2019   CHLORIDE 104 10/13/2014   CO2 33 (H) 08/24/2019   GLUCOSE 122 (H) 08/24/2019   BUN 14 08/24/2019   CREATININE 0.74 08/24/2019   BILITOT 1.2 08/24/2019   ALKPHOS 50 08/24/2019   AST 39 08/24/2019   ALT 36 08/24/2019   PROT 7.4 08/24/2019   ALBUMIN 4.5 08/24/2019   CALCIUM 9.6 08/24/2019   ANIONGAP 10 08/24/2019   EGFR >90 10/13/2014   GFR 104.35 07/25/2014   Lab Results  Component Value Date   CHOL 183 03/25/2019   Lab Results  Component Value Date   HDL 24 (L) 03/25/2019   Lab Results  Component Value Date   LDLCALC 108 (H) 03/25/2019   Lab Results  Component Value Date   TRIG 253 (H) 03/25/2019   Lab Results  Component Value Date   CHOLHDL 7.6 (H) 03/25/2019   Lab Results  Component Value Date   HGBA1C 6.3 03/25/2019

## 2019-09-16 MED ORDER — HYDROCODONE-ACETAMINOPHEN 7.5-325 MG PO TABS: 1.0000 | ORAL_TABLET | Freq: Three times a day (TID) | ORAL | 0 refills | Status: DC | PRN

## 2019-09-17 NOTE — Progress Notes (Signed)
Spoke with patient and he is not going to follow up with them

## 2019-09-20 DIAGNOSIS — I2781 Cor pulmonale (chronic): Secondary | ICD-10-CM | POA: Diagnosis not present

## 2019-09-20 DIAGNOSIS — J449 Chronic obstructive pulmonary disease, unspecified: Secondary | ICD-10-CM | POA: Diagnosis not present

## 2019-09-26 ENCOUNTER — Encounter: Payer: Self-pay | Admitting: Internal Medicine

## 2019-09-27 ENCOUNTER — Other Ambulatory Visit: Payer: Self-pay

## 2019-09-27 ENCOUNTER — Encounter: Payer: Self-pay | Admitting: Internal Medicine

## 2019-09-27 ENCOUNTER — Ambulatory Visit (INDEPENDENT_AMBULATORY_CARE_PROVIDER_SITE_OTHER): Payer: Medicare Other | Admitting: Internal Medicine

## 2019-09-27 VITALS — BP 128/82 | HR 83 | Temp 98.0°F | Ht 72.0 in | Wt 328.0 lb

## 2019-09-27 DIAGNOSIS — J9612 Chronic respiratory failure with hypercapnia: Secondary | ICD-10-CM

## 2019-09-27 DIAGNOSIS — Z720 Tobacco use: Secondary | ICD-10-CM

## 2019-09-27 DIAGNOSIS — G4733 Obstructive sleep apnea (adult) (pediatric): Secondary | ICD-10-CM | POA: Diagnosis not present

## 2019-09-27 DIAGNOSIS — J9611 Chronic respiratory failure with hypoxia: Secondary | ICD-10-CM

## 2019-09-27 DIAGNOSIS — J449 Chronic obstructive pulmonary disease, unspecified: Secondary | ICD-10-CM | POA: Diagnosis not present

## 2019-09-27 NOTE — Progress Notes (Signed)
HPI M smoker followed for OSA,COPD, complicated by HBP, Cor Pulmonale, dCHF, CAD, atherosclerosis, OSA, Chronic Respiratory Failure with Hypercapnia and Hypoxia,  PrimaryPolycythemia/ phlebotomy , Obesity, GERD NPSG 08/10/14- AHI 72/ hr, CPAP failed to control and changed to BIPAP 23/17. Study done on 3L O2 for control of desaturation despite BIPAP. Body weight 307 lbs BIPAP titration 01/28/2019-  CPAP inadequate. BIPAP to 22/18 and needed O2 3L to control Nocturnal Hypoxemia. Body weight 317 lbs PFT 05/24/2019- Moderate obstruction, no response to BD, Airtrapping, Nl Diffusion ---------------------------------------------------------------------------------------   05/24/2019- 68 yoM smoker followed for OSA, COPD, complicated by HBP, Cor Pulmonale, CAD, dCHF, Atherosclerosis, OSA, Chronic Respiratory Failure with Hypercapnia and Hypoxia,  PrimaryPolycythemia/ phlebotomy , Obesity, GERD, Psoriasis, BIPAP titration 01/28/2019-  CPAP inadequate. BIPAP to 22/18 and needed O2 3L to control Nocturnal Hypoxemia. Body weight 317 lbs Body weight today- 322 lbs Download compliance 100%, AHI 8.4/ hr BIPAP 22/18, PS2, O2 3L / [employer] High leak.  Sleeps better with BIPAP Continues phlebotomy for polycythemia. Discussed effect of low O2 sats.  PFT 05/24/2019- Moderate obstruction, no response to BD, Airtrapping, Nl Diffusion Not much cough. Has not used his nebulizer. Occasional albuterol rescue.  Declines flu vax. CT chest Low Dose Screening- 04/21/2019-  IMPRESSION: 1. Lung-RADS 2S, benign appearance or behavior. Continue annual screening with low-dose chest CT without contrast in 12 months. 2. The "S" modifier above refers to potentially clinically significant non lung cancer related findings. Specifically, there is aortic atherosclerosis, in addition to left main and 3 vessel coronary artery disease. Please note that although the presence of coronary artery calcium documents the presence of coronary  artery disease, the severity of this disease and any potential stenosis cannot be assessed on this non-gated CT examination. Assessment for potential risk factor modification, dietary therapy or pharmacologic therapy may be warranted, if clinically indicated. 3. Mild diffuse bronchial wall thickening with mild centrilobular and paraseptal emphysema; imaging findings suggestive of underlying COPD. 4. Dilatation of the pulmonic trunk (4.5 cm in diameter), concerning for pulmonary arterial hypertension. 5. There are calcifications of the aortic valve. Echocardiographic correlation for evaluation of potential valvular dysfunction may be warranted if clinically indicated. Aortic Atherosclerosis (ICD10-I70.0) and Emphysema (ICD10-J43.9).  09/27/19- 68 yoM smoker( 1 ppd) followed for OSA,, complicated by HBP, Cor Pulmonale, dCHF, Atherosclerosis, OSA, Chronic Respiratory Failure with Hypercapnia and Hypoxia,  PrimaryPolycythemia/ phlebotomy , Obesity, GERD, Psoriasis,  BIPAP titration 01/28/2019-  CPAP inadequate. BIPAP to 22/18 and needed O2 3L to control Nocturnal Hypoxemia. Body weight 317 lbs BIPAP 22/16, PS2, O2 3L / [employer] Download compliance 100%, AHI 4.8/ hr Body weight today-  328 lbs Continues phlebotomy for polycythemia. (on testosterone) Albuterol hfa, Duoneb Still smoking 1 ppd against advice- discussed. No flu or covid vax. Last used rescue inhaler few days ago.  Feels that he is breathing comfortably at rest. Easy DOE, unchanged. Little or no cough/ wheeze  ROS-see HPI   + = positive Constitutional:    weight loss, night sweats, fevers, chills, fatigue, lassitude. HEENT:    headaches, difficulty swallowing, tooth/dental problems, sore throat,       sneezing, itching, ear ache, nasal congestion, post nasal drip, snoring CV:    chest pain, orthopnea, PND, swelling in lower extremities, anasarca,                                  dizziness, palpitations Resp:   +shortness of breath  with exertion or  at rest.                productive cough,   non-productive cough, coughing up of blood.              change in color of mucus.  wheezing.   Skin:    rash or lesions. GI:  +heartburn, indigestion, abdominal pain, nausea, vomiting, diarrhea,                 change in bowel habits, loss of appetite GU: dysuria, change in color of urine, no urgency or frequency.   flank pain. MS:   joint pain, stiffness, decreased range of motion, back pain. Neuro-     nothing unusual Psych:  change in mood or affect.  depression or anxiety.   memory loss.  OBJ- Physical Exam General- Alert, Oriented, Affect-appropriate, Distress- none acute, +morbidly obese Skin- + psoriasis Lymphadenopathy- none Head- atraumatic            Eyes- Gross vision intact, PERRLA, conjunctivae and secretions clear            Ears- Hearing, canals-normal            Nose- Clear, no-Septal dev, mucus, polyps, erosion, perforation             Throat- Mallampati II , mucosa clear , drainage- none, tonsils- atrophic Neck- flexible , trachea midline, no stridor , thyroid nl, carotid no bruit Chest - symmetrical excursion , unlabored           Heart/CV- RRR , no murmur , no gallop  , no rub, nl s1 s2                           - JVD- none , edema- none, stasis changes- none, varices- none           Lung- clear to P&A, wheeze- none, cough- none , dullness-none, rub- none           Chest wall-  Abd-  Br/ Gen/ Rectal- Not done, not indicated Extrem- cyanosis- none, clubbing, none, atrophy- none, strength- nl Neuro- grossly intact to observation

## 2019-09-27 NOTE — Patient Instructions (Signed)
Order DME [employer]    - Need Portable Oxygen Concentrator 3L/ min, able to provide continuous flow through CPAP for travel  [employer] can also provide portable O2 concentrators. You can look them up and discuss your needs for O2 for overnight travel.  We can continue BIPAP 22/16, PS2, mask of choice, humidifier, supplies, AirView/ card  Please keep working on the cigarettes.  Call if we can help

## 2019-09-29 ENCOUNTER — Other Ambulatory Visit: Payer: Self-pay | Admitting: Dermatology

## 2019-10-02 ENCOUNTER — Encounter: Payer: Self-pay | Admitting: Internal Medicine

## 2019-10-02 NOTE — Assessment & Plan Note (Signed)
Benefits from BIPAP Plan- continue 22/16, O2 3L

## 2019-10-02 NOTE — Assessment & Plan Note (Signed)
He is well aware of issues and has been counseled. Support available.

## 2019-10-02 NOTE — Assessment & Plan Note (Signed)
Remains dependent on O2, using appropriately

## 2019-10-12 ENCOUNTER — Inpatient Hospital Stay (HOSPITAL_COMMUNITY): Payer: Medicare Other | Attending: Hematology

## 2019-10-12 ENCOUNTER — Other Ambulatory Visit: Payer: Self-pay

## 2019-10-12 ENCOUNTER — Inpatient Hospital Stay (HOSPITAL_COMMUNITY): Payer: Medicare Other

## 2019-10-12 DIAGNOSIS — D751 Secondary polycythemia: Secondary | ICD-10-CM

## 2019-10-12 DIAGNOSIS — D509 Iron deficiency anemia, unspecified: Secondary | ICD-10-CM | POA: Insufficient documentation

## 2019-10-12 LAB — COMPREHENSIVE METABOLIC PANEL
ALT: 39 U/L (ref 0–44)
AST: 43 U/L — ABNORMAL HIGH (ref 15–41)
Albumin: 4.3 g/dL (ref 3.5–5.0)
Alkaline Phosphatase: 48 U/L (ref 38–126)
Anion gap: 9 (ref 5–15)
BUN: 12 mg/dL (ref 8–23)
CO2: 31 mmol/L (ref 22–32)
Calcium: 9.2 mg/dL (ref 8.9–10.3)
Chloride: 100 mmol/L (ref 98–111)
Creatinine, Ser: 0.65 mg/dL (ref 0.61–1.24)
GFR calc Af Amer: >60 mL/min (ref >60)
GFR calc non Af Amer: >60 mL/min (ref >60)
Glucose, Bld: 136 mg/dL — ABNORMAL HIGH (ref 70–99)
Potassium: 4.3 mmol/L (ref 3.5–5.1)
Sodium: 140 mmol/L (ref 135–145)
Total Bilirubin: 1.2 mg/dL (ref 0.3–1.2)
Total Protein: 7.4 g/dL (ref 6.5–8.1)

## 2019-10-12 LAB — CBC WITH DIFFERENTIAL/PLATELET
Abs Immature Granulocytes: 0.05 10*3/uL (ref 0.00–0.07)
Basophils Absolute: 0.1 10*3/uL (ref 0.0–0.1)
Basophils Relative: 1 %
Eosinophils Absolute: 0.2 10*3/uL (ref 0.0–0.5)
Eosinophils Relative: 2 %
HCT: 56.3 % — ABNORMAL HIGH (ref 39.0–52.0)
Hemoglobin: 16.5 g/dL (ref 13.0–17.0)
Immature Granulocytes: 1 %
Lymphocytes Relative: 19 %
Lymphs Abs: 1.9 10*3/uL (ref 0.7–4.0)
MCH: 23.8 pg — ABNORMAL LOW (ref 26.0–34.0)
MCHC: 29.3 g/dL — ABNORMAL LOW (ref 30.0–36.0)
MCV: 81.2 fL (ref 80.0–100.0)
Monocytes Absolute: 1.2 10*3/uL — ABNORMAL HIGH (ref 0.1–1.0)
Monocytes Relative: 12 %
Neutro Abs: 6.4 10*3/uL (ref 1.7–7.7)
Neutrophils Relative %: 65 %
Platelets: 169 10*3/uL (ref 150–400)
RBC: 6.93 MIL/uL — ABNORMAL HIGH (ref 4.22–5.81)
RDW: 21.5 % — ABNORMAL HIGH (ref 11.5–15.5)
WBC: 9.8 10*3/uL (ref 4.0–10.5)
nRBC: 0 % (ref 0.0–0.2)

## 2019-10-12 LAB — LACTATE DEHYDROGENASE: LDH: 165 U/L (ref 98–192)

## 2019-10-12 NOTE — Progress Notes (Unsigned)
Philip Powell presents today for phlebotomy per MD orders. Phlebotomy procedure started at 1355and ended at  *** grams removed. Patient observed for 30 minutes after procedure without any incident. Patient tolerated procedure well. IV needle removed intact.

## 2019-10-13 ENCOUNTER — Telehealth: Payer: Self-pay | Admitting: Dermatology

## 2019-10-13 NOTE — Telephone Encounter (Signed)
Patient calling for pathology results. Chart [phone]

## 2019-10-13 NOTE — Telephone Encounter (Signed)
Pathology given to patient 

## 2019-11-23 ENCOUNTER — Other Ambulatory Visit: Payer: Self-pay

## 2019-11-23 ENCOUNTER — Encounter (HOSPITAL_COMMUNITY): Payer: Self-pay

## 2019-11-23 ENCOUNTER — Inpatient Hospital Stay (HOSPITAL_COMMUNITY): Payer: Medicare Other | Attending: Hematology

## 2019-11-23 ENCOUNTER — Inpatient Hospital Stay (HOSPITAL_COMMUNITY): Payer: Medicare Other

## 2019-11-23 DIAGNOSIS — D751 Secondary polycythemia: Secondary | ICD-10-CM | POA: Diagnosis present

## 2019-11-23 LAB — COMPREHENSIVE METABOLIC PANEL
ALT: 42 U/L (ref 0–44)
AST: 47 U/L — ABNORMAL HIGH (ref 15–41)
Albumin: 4.4 g/dL (ref 3.5–5.0)
Alkaline Phosphatase: 48 U/L (ref 38–126)
Anion gap: 8 (ref 5–15)
BUN: 12 mg/dL (ref 8–23)
CO2: 30 mmol/L (ref 22–32)
Calcium: 9.1 mg/dL (ref 8.9–10.3)
Chloride: 101 mmol/L (ref 98–111)
Creatinine, Ser: 0.64 mg/dL (ref 0.61–1.24)
GFR calc Af Amer: >60 mL/min (ref >60)
GFR calc non Af Amer: >60 mL/min (ref >60)
Glucose, Bld: 106 mg/dL — ABNORMAL HIGH (ref 70–99)
Potassium: 4.6 mmol/L (ref 3.5–5.1)
Sodium: 139 mmol/L (ref 135–145)
Total Bilirubin: 1.2 mg/dL (ref 0.3–1.2)
Total Protein: 7.3 g/dL (ref 6.5–8.1)

## 2019-11-23 LAB — CBC WITH DIFFERENTIAL/PLATELET
Abs Immature Granulocytes: 0.05 10*3/uL (ref 0.00–0.07)
Basophils Absolute: 0.1 10*3/uL (ref 0.0–0.1)
Basophils Relative: 1 %
Eosinophils Absolute: 0.3 10*3/uL (ref 0.0–0.5)
Eosinophils Relative: 3 %
HCT: 56 % — ABNORMAL HIGH (ref 39.0–52.0)
Hemoglobin: 16.2 g/dL (ref 13.0–17.0)
Immature Granulocytes: 1 %
Lymphocytes Relative: 20 %
Lymphs Abs: 1.9 10*3/uL (ref 0.7–4.0)
MCH: 23.1 pg — ABNORMAL LOW (ref 26.0–34.0)
MCHC: 28.9 g/dL — ABNORMAL LOW (ref 30.0–36.0)
MCV: 79.9 fL — ABNORMAL LOW (ref 80.0–100.0)
Monocytes Absolute: 1.1 10*3/uL — ABNORMAL HIGH (ref 0.1–1.0)
Monocytes Relative: 12 %
Neutro Abs: 6.2 10*3/uL (ref 1.7–7.7)
Neutrophils Relative %: 63 %
Platelets: 176 10*3/uL (ref 150–400)
RBC: 7.01 MIL/uL — ABNORMAL HIGH (ref 4.22–5.81)
RDW: 21.2 % — ABNORMAL HIGH (ref 11.5–15.5)
WBC: 9.6 10*3/uL (ref 4.0–10.5)
nRBC: 0 % (ref 0.0–0.2)

## 2019-11-23 LAB — LACTATE DEHYDROGENASE: LDH: 150 U/L (ref 98–192)

## 2019-11-23 NOTE — Progress Notes (Signed)
Onas Traxler Acton presents today for phlebotomy per MD orders. Phlebotomy procedure started at 1345 and ended at 1355. 500 cc removed. Patient tolerated procedure well. IV needle removed intact.  Patient left peripheral IV site clean and dry with no bruising or swelling noted at site.  Gauze and coban applied.  VSS with discharge and left ambulatory with no s/s of distress noted.

## 2019-12-14 ENCOUNTER — Encounter: Payer: Self-pay | Admitting: Family Medicine

## 2019-12-17 ENCOUNTER — Encounter: Payer: Self-pay | Admitting: Family Medicine

## 2019-12-17 ENCOUNTER — Other Ambulatory Visit: Payer: Self-pay

## 2019-12-17 ENCOUNTER — Ambulatory Visit (INDEPENDENT_AMBULATORY_CARE_PROVIDER_SITE_OTHER): Payer: Medicare Other | Admitting: Family Medicine

## 2019-12-17 VITALS — BP 149/77 | HR 93 | Temp 96.9°F | Ht 73.0 in | Wt 327.4 lb

## 2019-12-17 DIAGNOSIS — M180 Bilateral primary osteoarthritis of first carpometacarpal joints: Secondary | ICD-10-CM

## 2019-12-17 DIAGNOSIS — Z79899 Other long term (current) drug therapy: Secondary | ICD-10-CM | POA: Diagnosis not present

## 2019-12-17 DIAGNOSIS — E119 Type 2 diabetes mellitus without complications: Secondary | ICD-10-CM | POA: Insufficient documentation

## 2019-12-17 DIAGNOSIS — R7303 Prediabetes: Secondary | ICD-10-CM

## 2019-12-17 DIAGNOSIS — M47812 Spondylosis without myelopathy or radiculopathy, cervical region: Secondary | ICD-10-CM

## 2019-12-17 DIAGNOSIS — E1165 Type 2 diabetes mellitus with hyperglycemia: Secondary | ICD-10-CM

## 2019-12-17 HISTORY — DX: Type 2 diabetes mellitus without complications: E11.9

## 2019-12-17 LAB — BAYER DCA HB A1C WAIVED: HB A1C (BAYER DCA - WAIVED): 6.6 % (ref <7.0)

## 2019-12-17 MED ORDER — HYDROCODONE-ACETAMINOPHEN 7.5-325 MG PO TABS: 1.0000 | ORAL_TABLET | Freq: Three times a day (TID) | ORAL | 0 refills | Status: AC | PRN

## 2019-12-17 NOTE — Progress Notes (Signed)
Assessment & Plan:  1-3. Primary osteoarthritis of both first carpometacarpal joints/ Cervical spondylosis without myelopathy/Controlled substance agreement signed - Well controlled on current regimen.  - HYDROcodone-acetaminophen (NORCO) 7.5-325 MG tablet; Take 1 tablet by mouth 3 (three) times daily as needed for moderate pain.  Dispense: 90 tablet; Refill: 0 - HYDROcodone-acetaminophen (NORCO) 7.5-325 MG tablet; Take 1 tablet by mouth 3 (three) times daily as needed for moderate pain.  Dispense: 90 tablet; Refill: 0 - HYDROcodone-acetaminophen (NORCO) 7.5-325 MG tablet; Take 1 tablet by mouth 3 (three) times daily as needed for moderate pain.  Dispense: 90 tablet; Refill: 0  4. Prediabetes - Bayer DCA Hb A1c Waived   Return in about 3 months (around 03/18/2020) for follow-up of chronic medication conditions.  Hendricks Limes, MSN, APRN, FNP-C [facility]  Subjective:    Patient ID: Philip Powell, male    DOB: 05-Jun-1951, 69 y.o.   MRN: [id number]  Patient Care Team: Loman Brooklyn, FNP as PCP - General (Family Medicine) Satira Sark, MD as PCP - Cardiology (Cardiology) Satira Sark, MD as Consulting Physician (Cardiology) Deneise Lever, MD as Consulting Physician (Pulmonary Disease)   Chief Complaint:  Chief Complaint  Patient presents with  . Medication Refill    Norco     HPI: Philip Powell is a 69 y.o. male presenting on 12/17/2019 for Medication Refill (Norco )  Pain assessment: Cause of pain-degeneratives changes/arthritis in hands; degenerative changes, foraminal narrowing, and joint space disk narrowing with osteophyte formation especially at C5-6; increased size of nerve in wrists Pain location-bilateral hands and neck Pain on scale of 1-10-2-3/10 with medication Frequency-daily What increases pain-cold, increased activiity What makes pain better-pain medication Effects on ADL -makes difficult but gets  through it Any change in general medical condition-No  Current opioids rx-Norco 7.5/325 TID PRN # meds rx-90 Effectiveness of current meds-effective Adverse reactions form pain meds-none Morphine equivalent-22.50  Pill count performed-No Last drug screen -03/25/19 ( high risk q14m moderate risk q631mlow risk yearly ) Urine drug screen today-No [place] If yes were their any concerning findings? - No   Overdose risk: 060  Opioid Risk  12/17/2019  Alcohol 0  Illegal Drugs 0  Rx Drugs 0  Alcohol 0  Illegal Drugs 0  Rx Drugs 0  Age between 67-45 years  0  History of Preadolescent Sexual Abuse 0  Psychological Disease 0  Depression 1  Opioid Risk Tool Scoring 1  Opioid Risk Interpretation Low Risk    Pain contract signed on: 03/25/2019   New complaints: None  Social history:  Relevant past medical, surgical, family and social history reviewed and updated as indicated. Interim medical history since our last visit reviewed.  Allergies and medications reviewed and updated.  DATA REVIEWED: CHART IN EPIC  ROS: Negative unless specifically indicated above in HPI.    Current Outpatient Medications:  .  albuterol (VENTOLIN HFA) 108 (90 Base) MCG/ACT inhaler, Inhale 2 puffs into the lungs every 6 (six) hours as needed for wheezing or shortness of breath., Disp: 1 Inhaler, Rfl: 0 .  Ascorbic Acid (VITAMIN C PO), Take 1 tablet by mouth daily., Disp: , Rfl:  .  aspirin 325 MG tablet, Take 325 mg by mouth daily., Disp: , Rfl:  .  atorvastatin (LIPITOR) 10 MG tablet, Take 10 mg by mouth daily., Disp: , Rfl:  .  DULoxetine (CYMBALTA) 30 MG capsule, Take 1 capsule (30 mg total) by mouth 2 (two) times  daily., Disp: 60 capsule, Rfl: 2 .  famotidine (PEPCID) 20 MG tablet, Take 20 mg by mouth daily., Disp: , Rfl:  .  furosemide (LASIX) 40 MG tablet, Take 0.5 tablets (20 mg total) by mouth daily., Disp: 30 tablet, Rfl: 0 .   HYDROcodone-acetaminophen (NORCO) 7.5-325 MG tablet, Take 1 tablet by mouth 3 (three) times daily as needed for moderate pain., Disp: 90 tablet, Rfl: 0 .  HYDROcodone-acetaminophen (NORCO) 7.5-325 MG tablet, Take 1 tablet by mouth 3 (three) times daily as needed for moderate pain., Disp: 90 tablet, Rfl: 0 .  HYDROcodone-acetaminophen (NORCO) 7.5-325 MG tablet, Take 1 tablet by mouth 3 (three) times daily as needed for moderate pain., Disp: 90 tablet, Rfl: 0 .  ibuprofen (ADVIL) 200 MG tablet, Take 400 mg by mouth every 6 (six) hours as needed for mild pain., Disp: , Rfl:  .  loratadine (CLARITIN) 10 MG tablet, Take 10 mg by mouth daily as needed for allergies., Disp: , Rfl:  .  Multiple Vitamins-Minerals (CENTRUM ADULTS) TABS, Take 1 tablet by mouth daily., Disp: , Rfl:  .  OXYGEN, Inhale 3 L into the lungs continuous., Disp: , Rfl:  .  testosterone cypionate (DEPOTESTOSTERONE CYPIONATE) 200 MG/ML injection, Inject 1 mL (200 mg total) into the muscle every 14 (fourteen) days., Disp: 10 mL, Rfl: 1   No Known Allergies Past Medical History:  Diagnosis Date  . Chronic pain   . COPD (chronic obstructive pulmonary disease) ([place])   . Cor pulmonale, chronic ([place])   . GERD (gastroesophageal reflux disease)   . History of diabetes mellitus    A1c 6.5 on 01/17/17; 6.3 on 03/25/2019  . History of pneumonia   . History of seizure   . Obesity   . Obstructive sleep apnea    CPAP  . Polycythemia     Past Surgical History:  Procedure Laterality Date  . CARPAL TUNNEL RELEASE Right 2008  . NEUROPLASTY / TRANSPOSITION MEDIAN NERVE AT CARPAL TUNNEL Bilateral   . VASECTOMY      Social History   Socioeconomic History  . Marital status: Married    Spouse name: Not on file  . Number of children: 3  . Years of education: Not on file  . Highest education level: Not on file  Occupational History  . Occupation: lpn    Comment: retired  Tobacco Use  . Smoking status: Current Every Day Smoker     Packs/day: 1.00    Years: 43.00    Pack years: 43.00    Types: Cigarettes  . Smokeless tobacco: Never Used  . Tobacco comment: 0.5  Substance and Sexual Activity  . Alcohol use: Yes    Alcohol/week: 0.0 standard drinks    Comment: occasionally  . Drug use: No  . Sexual activity: Not Currently  Other Topics Concern  . Not on file  Social History Narrative  . Not on file   Social Determinants of Health   Financial Resource Strain:   . Difficulty of Paying Living Expenses:   Food Insecurity:   . Worried About Charity fundraiser in the Last Year:   . Arboriculturist in the Last Year:   Transportation Needs:   . Film/video editor (Medical):   Marland Kitchen Lack of Transportation (Non-Medical):   Physical Activity:   . Days of Exercise per Week:   . Minutes of Exercise per Session:   Stress:   . Feeling of Stress :   Social Connections:   . Frequency of  Communication with Friends and Family:   . Frequency of Social Gatherings with Friends and Family:   . Attends Religious Services:   . Active Member of Clubs or Organizations:   . Attends Archivist Meetings:   Marland Kitchen Marital Status:   Intimate Partner Violence:   . Fear of Current or Ex-Partner:   . Emotionally Abused:   Marland Kitchen Physically Abused:   . Sexually Abused:         Objective:    BP (!) 149/77   Pulse 93   Temp (!) 96.9 F (36.1 C) (Temporal)   Ht '6\' 1"'  (1.854 m)   Wt (!) 327 lb 6.4 oz (148.5 kg)   SpO2 94%   BMI 43.20 kg/m   Wt Readings from Last 3 Encounters:  12/17/19 (!) 327 lb 6.4 oz (148.5 kg)  09/27/19 (!) 328 lb (148.8 kg)  09/15/19 (!) 328 lb (148.8 kg)    Physical Exam Vitals reviewed.  Constitutional:      General: He is not in acute distress.    Appearance: Normal appearance. He is morbidly obese. He is not ill-appearing, toxic-appearing or diaphoretic.  HENT:     Head: Normocephalic and atraumatic.  Eyes:     General: No scleral icterus.       Right eye: No discharge.        Left  eye: No discharge.     Conjunctiva/sclera: Conjunctivae normal.  Cardiovascular:     Rate and Rhythm: Normal rate and regular rhythm.     Heart sounds: Normal heart sounds. No murmur. No friction rub. No gallop.   Pulmonary:     Effort: Pulmonary effort is normal. No respiratory distress.     Breath sounds: Normal breath sounds. No stridor. No wheezing, rhonchi or rales.  Musculoskeletal:        General: Normal range of motion.     Cervical back: Normal range of motion.  Skin:    General: Skin is warm and dry.  Neurological:     Mental Status: He is alert and oriented to person, place, and time. Mental status is at baseline.  Psychiatric:        Mood and Affect: Mood normal.        Behavior: Behavior normal.        Thought Content: Thought content normal.        Judgment: Judgment normal.     Lab Results  Component Value Date   TSH 1.262 12/15/2018   Lab Results  Component Value Date   WBC 9.6 11/23/2019   HGB 16.2 11/23/2019   HCT 56.0 (H) 11/23/2019   MCV 79.9 (L) 11/23/2019   PLT 176 11/23/2019   Lab Results  Component Value Date   NA 139 11/23/2019   K 4.6 11/23/2019   CHLORIDE 104 10/13/2014   CO2 30 11/23/2019   GLUCOSE 106 (H) 11/23/2019   BUN 12 11/23/2019   CREATININE 0.64 11/23/2019   BILITOT 1.2 11/23/2019   ALKPHOS 48 11/23/2019   AST 47 (H) 11/23/2019   ALT 42 11/23/2019   PROT 7.3 11/23/2019   ALBUMIN 4.4 11/23/2019   CALCIUM 9.1 11/23/2019   ANIONGAP 8 11/23/2019   EGFR >90 10/13/2014   GFR 104.35 07/25/2014   Lab Results  Component Value Date   CHOL 183 03/25/2019   Lab Results  Component Value Date   HDL 24 (L) 03/25/2019   Lab Results  Component Value Date   LDLCALC 108 (H) 03/25/2019   Lab Results  Component Value Date   TRIG 253 (H) 03/25/2019   Lab Results  Component Value Date   CHOLHDL 7.6 (H) 03/25/2019   Lab Results  Component Value Date   HGBA1C 6.6 12/17/2019

## 2019-12-20 ENCOUNTER — Encounter: Payer: Self-pay | Admitting: Family Medicine

## 2020-01-04 ENCOUNTER — Other Ambulatory Visit: Payer: Self-pay

## 2020-01-04 ENCOUNTER — Inpatient Hospital Stay (HOSPITAL_COMMUNITY): Payer: Medicare Other

## 2020-01-04 ENCOUNTER — Inpatient Hospital Stay (HOSPITAL_COMMUNITY): Payer: Medicare Other | Attending: Nurse Practitioner | Admitting: Nurse Practitioner

## 2020-01-04 DIAGNOSIS — Z79899 Other long term (current) drug therapy: Secondary | ICD-10-CM | POA: Insufficient documentation

## 2020-01-04 DIAGNOSIS — F17210 Nicotine dependence, cigarettes, uncomplicated: Secondary | ICD-10-CM | POA: Insufficient documentation

## 2020-01-04 DIAGNOSIS — D751 Secondary polycythemia: Secondary | ICD-10-CM

## 2020-01-04 DIAGNOSIS — E669 Obesity, unspecified: Secondary | ICD-10-CM | POA: Diagnosis not present

## 2020-01-04 DIAGNOSIS — D696 Thrombocytopenia, unspecified: Secondary | ICD-10-CM | POA: Diagnosis not present

## 2020-01-04 DIAGNOSIS — G4730 Sleep apnea, unspecified: Secondary | ICD-10-CM | POA: Insufficient documentation

## 2020-01-04 DIAGNOSIS — J449 Chronic obstructive pulmonary disease, unspecified: Secondary | ICD-10-CM | POA: Diagnosis not present

## 2020-01-04 DIAGNOSIS — E119 Type 2 diabetes mellitus without complications: Secondary | ICD-10-CM | POA: Insufficient documentation

## 2020-01-04 DIAGNOSIS — Z7982 Long term (current) use of aspirin: Secondary | ICD-10-CM | POA: Insufficient documentation

## 2020-01-04 LAB — CBC WITH DIFFERENTIAL/PLATELET
Abs Immature Granulocytes: 0.04 10*3/uL (ref 0.00–0.07)
Basophils Absolute: 0.1 10*3/uL (ref 0.0–0.1)
Basophils Relative: 1 %
Eosinophils Absolute: 0.2 10*3/uL (ref 0.0–0.5)
Eosinophils Relative: 3 %
HCT: 53.5 % — ABNORMAL HIGH (ref 39.0–52.0)
Hemoglobin: 15.6 g/dL (ref 13.0–17.0)
Immature Granulocytes: 1 %
Lymphocytes Relative: 22 %
Lymphs Abs: 1.8 10*3/uL (ref 0.7–4.0)
MCH: 22.9 pg — ABNORMAL LOW (ref 26.0–34.0)
MCHC: 29.2 g/dL — ABNORMAL LOW (ref 30.0–36.0)
MCV: 78.7 fL — ABNORMAL LOW (ref 80.0–100.0)
Monocytes Absolute: 1.1 10*3/uL — ABNORMAL HIGH (ref 0.1–1.0)
Monocytes Relative: 13 %
Neutro Abs: 5.1 10*3/uL (ref 1.7–7.7)
Neutrophils Relative %: 60 %
Platelets: 170 10*3/uL (ref 150–400)
RBC: 6.8 MIL/uL — ABNORMAL HIGH (ref 4.22–5.81)
RDW: 21.6 % — ABNORMAL HIGH (ref 11.5–15.5)
WBC: 8.4 10*3/uL (ref 4.0–10.5)
nRBC: 0 % (ref 0.0–0.2)

## 2020-01-04 LAB — COMPREHENSIVE METABOLIC PANEL
ALT: 31 U/L (ref 0–44)
AST: 37 U/L (ref 15–41)
Albumin: 4.3 g/dL (ref 3.5–5.0)
Alkaline Phosphatase: 46 U/L (ref 38–126)
Anion gap: 10 (ref 5–15)
BUN: 10 mg/dL (ref 8–23)
CO2: 29 mmol/L (ref 22–32)
Calcium: 8.9 mg/dL (ref 8.9–10.3)
Chloride: 101 mmol/L (ref 98–111)
Creatinine, Ser: 0.67 mg/dL (ref 0.61–1.24)
GFR calc Af Amer: >60 mL/min (ref >60)
GFR calc non Af Amer: >60 mL/min (ref >60)
Glucose, Bld: 111 mg/dL — ABNORMAL HIGH (ref 70–99)
Potassium: 4.4 mmol/L (ref 3.5–5.1)
Sodium: 140 mmol/L (ref 135–145)
Total Bilirubin: 0.8 mg/dL (ref 0.3–1.2)
Total Protein: 7.2 g/dL (ref 6.5–8.1)

## 2020-01-04 LAB — LACTATE DEHYDROGENASE: LDH: 147 U/L (ref 98–192)

## 2020-01-04 NOTE — Progress Notes (Signed)
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   INTERVAL HISTORY:  Philip Powell 69 y.o. male returns for routine follow-up for polycythemia.  Patient reports he is doing well since his last visit.  He reports he feels a lot better after his phlebotomies every 6 weeks.  He denies any aqua genic pruritus.  He denies any headaches as long as he gets his phlebotomies on time. Denies any nausea, vomiting, or diarrhea. Denies any new pains. Had not noticed any recent bleeding such as epistaxis, hematuria or hematochezia. Denies recent chest pain on exertion, shortness of breath on minimal exertion, pre-syncopal episodes, or palpitations. Denies any numbness or tingling in hands or feet. Denies any recent fevers, infections, or recent hospitalizations. Patient reports appetite at 100% and energy level at 25%.  He is eating well maintain his weight at this time.     REVIEW OF SYSTEMS:  Review of Systems  Constitutional: Positive for fatigue.  All other systems reviewed and are negative.    PAST MEDICAL/SURGICAL HISTORY:  Past Medical History:  Diagnosis Date  . Chronic pain   . COPD (chronic obstructive pulmonary disease) ([place])   . Cor pulmonale, chronic ([place])   . Diabetes ([place]) 12/17/2019  . GERD (gastroesophageal reflux disease)   . History of diabetes mellitus    A1c 6.5 on 01/17/17; 6.3 on 03/25/2019  . History of pneumonia   . History of seizure   . Obesity   . Obstructive sleep apnea    CPAP  . Polycythemia    Past Surgical History:  Procedure Laterality Date  . CARPAL TUNNEL RELEASE Right 2008  . NEUROPLASTY / TRANSPOSITION MEDIAN NERVE AT CARPAL TUNNEL Bilateral   . VASECTOMY       SOCIAL HISTORY:  Social History     Socioeconomic History  . Marital status: Married    Spouse name: Not on file  . Number of children: 3  . Years of education: Not on file  . Highest education level: Not on file  Occupational History  . Occupation: lpn    Comment: retired  Tobacco Use  . Smoking status: Current Every Day Smoker    Packs/day: 1.00    Years: 43.00    Pack years: 43.00    Types: Cigarettes  . Smokeless tobacco: Never Used  . Tobacco comment: 0.5  Substance and Sexual Activity  . Alcohol use: Yes    Alcohol/week: 0.0 standard drinks    Comment: occasionally  . Drug use: No  . Sexual activity: Not Currently  Other Topics Concern  . Not on file  Social History Narrative  . Not on file   Social Determinants of Health   Financial Resource Strain:   . Difficulty of Paying Living Expenses:   Food Insecurity:   . Worried About Charity fundraiser in the Last Year:   . Arboriculturist in the Last Year:   Transportation Needs:   . Film/video editor (Medical):   Marland Kitchen Lack of Transportation (Non-Medical):   Physical Activity:   . Days of Exercise per Week:   . Minutes of Exercise per Session:   Stress:   . Feeling of Stress :   Social Connections:   . Frequency of Communication with Friends  and Family:   . Frequency of Social Gatherings with Friends and Family:   . Attends Religious Services:   . Active Member of Clubs or Organizations:   . Attends Archivist Meetings:   Marland Kitchen Marital Status:   Intimate Partner Violence:   . Fear of Current or Ex-Partner:   . Emotionally Abused:   Marland Kitchen Physically Abused:   . Sexually Abused:     FAMILY HISTORY:  Family History  Problem Relation Age of Onset  . Cancer Brother   . Heart disease Maternal Grandfather   . Heart attack Maternal Grandfather 55  . Cancer Mother   . Cancer Father   . Hypertension Son   . Cancer Sister   . Cancer Brother   . Lupus Sister     CURRENT MEDICATIONS:  Outpatient Encounter Medications as of 01/04/2020   Medication Sig  . Ascorbic Acid (VITAMIN C PO) Take 1 tablet by mouth daily.  Marland Kitchen aspirin 325 MG tablet Take 325 mg by mouth daily.  Marland Kitchen atorvastatin (LIPITOR) 10 MG tablet Take 10 mg by mouth daily.  . DULoxetine (CYMBALTA) 30 MG capsule Take 1 capsule (30 mg total) by mouth 2 (two) times daily.  . famotidine (PEPCID) 20 MG tablet Take 20 mg by mouth daily.  . furosemide (LASIX) 40 MG tablet Take 0.5 tablets (20 mg total) by mouth daily.  Marland Kitchen loratadine (CLARITIN) 10 MG tablet Take 10 mg by mouth daily as needed for allergies.  . Multiple Vitamins-Minerals (CENTRUM ADULTS) TABS Take 1 tablet by mouth daily.  . OXYGEN Inhale 3 L into the lungs continuous.  Marland Kitchen testosterone cypionate (DEPOTESTOSTERONE CYPIONATE) 200 MG/ML injection Inject 1 mL (200 mg total) into the muscle every 14 (fourteen) days.  Marland Kitchen albuterol (VENTOLIN HFA) 108 (90 Base) MCG/ACT inhaler Inhale 2 puffs into the lungs every 6 (six) hours as needed for wheezing or shortness of breath. (Patient not taking: Reported on 01/04/2020)  . HYDROcodone-acetaminophen (NORCO) 7.5-325 MG tablet Take 1 tablet by mouth 3 (three) times daily as needed for moderate pain. (Patient not taking: Reported on 01/04/2020)  . HYDROcodone-acetaminophen (NORCO) 7.5-325 MG tablet Take 1 tablet by mouth 3 (three) times daily as needed for moderate pain. (Patient not taking: Reported on 01/04/2020)  . HYDROcodone-acetaminophen (NORCO) 7.5-325 MG tablet Take 1 tablet by mouth 3 (three) times daily as needed for moderate pain. (Patient not taking: Reported on 01/04/2020)  . ibuprofen (ADVIL) 200 MG tablet Take 400 mg by mouth every 6 (six) hours as needed for mild pain.   No facility-administered encounter medications on file as of 01/04/2020.    ALLERGIES:  No Known Allergies   PHYSICAL EXAM:  ECOG Performance status: 1  Vitals:   01/04/20 1402  BP: 134/85  Pulse: 83  Resp: 20  Temp: (!) 96.8 F (36 C)  SpO2: 94%   Filed Weights   01/04/20 1402  Weight: (!)  324 lb (147 kg)   Physical Exam Constitutional:      Appearance: He is obese.  Cardiovascular:     Rate and Rhythm: Normal rate and regular rhythm.     Heart sounds: Normal heart sounds.  Pulmonary:     Effort: Pulmonary effort is normal.     Breath sounds: Normal breath sounds.  Abdominal:     General: Bowel sounds are normal.     Palpations: Abdomen is soft.  Musculoskeletal:        General: Normal range of motion.  Skin:    General: Skin is  warm.  Neurological:     Mental Status: He is alert and oriented to person, place, and time. Mental status is at baseline.  Psychiatric:        Mood and Affect: Mood normal.        Behavior: Behavior normal.        Thought Content: Thought content normal.        Judgment: Judgment normal.      LABORATORY DATA:  I have reviewed the labs as listed.  CBC    Component Value Date/Time   WBC 8.4 01/04/2020 1304   RBC 6.80 (H) 01/04/2020 1304   HGB 15.6 01/04/2020 1304   HGB 17.9 (H) 03/25/2019 1422   HGB 15.5 12/16/2014 0911   HCT 53.5 (H) 01/04/2020 1304   HCT 57.7 (H) 03/25/2019 1422   HCT 47.9 12/16/2014 0911   PLT 170 01/04/2020 1304   PLT 118 (L) 03/25/2019 1422   MCV 78.7 (L) 01/04/2020 1304   MCV 82 03/25/2019 1422   MCV 92.5 12/16/2014 0911   MCH 22.9 (L) 01/04/2020 1304   MCHC 29.2 (L) 01/04/2020 1304   RDW 21.6 (H) 01/04/2020 1304   RDW 23.0 (H) 03/25/2019 1422   RDW 14.7 (H) 12/16/2014 0911   LYMPHSABS 1.8 01/04/2020 1304   LYMPHSABS 2.0 03/25/2019 1422   LYMPHSABS 2.2 12/16/2014 0911   MONOABS 1.1 (H) 01/04/2020 1304   MONOABS 1.4 (H) 12/16/2014 0911   EOSABS 0.2 01/04/2020 1304   EOSABS 0.2 03/25/2019 1422   BASOSABS 0.1 01/04/2020 1304   BASOSABS 0.1 03/25/2019 1422   BASOSABS 0.1 12/16/2014 0911   CMP Latest Ref Rng & Units 11/23/2019 10/12/2019 08/24/2019  Glucose 70 - 99 mg/dL 106(H) 136(H) 122(H)  BUN 8 - 23 mg/dL 12 12 14   Creatinine 0.61 - 1.24 mg/dL 0.64 0.65 0.74  Sodium 135 - 145 mmol/L 139 140  140  Potassium 3.5 - 5.1 mmol/L 4.6 4.3 4.6  Chloride 98 - 111 mmol/L 101 100 97(L)  CO2 22 - 32 mmol/L 30 31 33(H)  Calcium 8.9 - 10.3 mg/dL 9.1 9.2 9.6  Total Protein 6.5 - 8.1 g/dL 7.3 7.4 7.4  Total Bilirubin 0.3 - 1.2 mg/dL 1.2 1.2 1.2  Alkaline Phos 38 - 126 U/L 48 48 50  AST 15 - 41 U/L 47(H) 43(H) 39  ALT 0 - 44 U/L 42 39 36    All questions were answered to patient's stated satisfaction. Encouraged patient to call with any new concerns or questions before his next visit to the cancer center and we can certain see him sooner, if needed.     ASSESSMENT & PLAN:  Polycythemia 1.  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 Earlie Server in March 2016 for polycythemia. -Work-up included erythroprotein was 18.1, Jak 2, CALR, MPL were all negative.  UA was negative for hematuria.  Carboxyhemoglobin was elevated at 2.5. -Etiology of erythrocytosis includes sleep apnea, testosterone supplements, and Lasix.  He is also a current active smoker, 1 pack/per day for 50 years. -Last phlebotomy was on 08/30/2019. -Patient denies any fevers, night sweats, or weight loss.  Denies any vasomotor symptoms or aquagenic pruritus.  No prior history of thrombus. -Labs on 01/04/2020 shows hemoglobin 15.6 and hematocrit 53.5. -He has been getting phlebotomies every 6 weeks.  We will continue this. -He usually gets chest fullness and feels funny in the head when he requires a phlebotomy. -He will follow back up with [place] in 3 months with repeat labs.  2.  Tobacco abuse: -He  smokes 1 pack/day for 50 years. -CT lung screening protocol on 04/21/2019 which was lung RADS 2.  Annual screenings was recommended in 12 months. -It was also discussed with him about other findings including dilatation of pulmonary trunk, 4.5 cm in diameter concerning for pulmonary artery hypertension.  He will discuss this with his cardiologist.  3.  Intermittent mild thrombocytopenia: -He has intermittent mild thrombocytopenia since  February 2020. -Labs last done on 01/04/2020 shows a platelet count of 170.     Orders placed this encounter:  Orders Placed This Encounter  Procedures  . Lactate dehydrogenase  . Vitamin B12  . VITAMIN D 25 Hydroxy (Vit-D Deficiency, Fractures)  . CBC with Differential/Platelet  . Comprehensive metabolic panel      Francene Finders, FNP-C [place] [phone]

## 2020-01-04 NOTE — Progress Notes (Signed)
Yi Ringeison presents today for phlebotomy per NP order. Hgb 15.6 and Hct 53.5 today   Phlebotomy procedure started at 1432 and ended at 1440.          500 cc removed                                                                             Patient tolerated procedure well.                                                   IV needle removed intact                                                                Pt left with peripheral IV site clean and dry and no bruising or swelling noted at site. Gauze and coban applied. VSS upon discharge. Pt discharged self ambulatory in satisfactory condition

## 2020-01-04 NOTE — Patient Instructions (Signed)
[facility] at [facility] Discharge Instructions     Thank you for choosing [facility] at [facility] to provide your oncology and hematology care.  To afford each patient quality time with our provider, please arrive at least 15 minutes before your scheduled appointment time.   If you have a lab appointment with the Cancer Center please come in thru the Main Entrance and check in at the main information desk.  You need to re-schedule your appointment should you arrive 10 or more minutes late.  We strive to give you quality time with our providers, and arriving late affects you and other patients whose appointments are after yours.  Also, if you no show three or more times for appointments you may be dismissed from the clinic at the providers discretion.     Again, thank you for choosing [facility].  Our hope is that these requests will decrease the amount of time that you wait before being seen by our physicians.       _____________________________________________________________  Should you have questions after your visit to [facility], please contact our office at [phone] between the hours of 8:00 a.m. and 4:30 p.m.  Voicemails left after 4:00 p.m. will not be returned until the following business day.  For prescription refill requests, have your pharmacy contact our office and allow 72 hours.    Due to Covid, you will need to wear a mask upon entering the hospital. If you do not have a mask, a mask will be given to you at the Main Entrance upon arrival. For doctor visits, patients may have 1 support person with them. For treatment visits, patients can not have anyone with them due to social distancing guidelines and our immunocompromised population.      

## 2020-01-04 NOTE — Assessment & Plan Note (Signed)
1.  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 Earlie Server in March 2016 for polycythemia. -Work-up included erythroprotein was 18.1, Jak 2, CALR, MPL were all negative.  UA was negative for hematuria.  Carboxyhemoglobin was elevated at 2.5. -Etiology of erythrocytosis includes sleep apnea, testosterone supplements, and Lasix.  He is also a current active smoker, 1 pack/per day for 50 years. -Last phlebotomy was on 08/30/2019. -Patient denies any fevers, night sweats, or weight loss.  Denies any vasomotor symptoms or aquagenic pruritus.  No prior history of thrombus. -Labs on 01/04/2020 shows hemoglobin 15.6 and hematocrit 53.5. -He has been getting phlebotomies every 6 weeks.  We will continue this. -He usually gets chest fullness and feels funny in the head when he requires a phlebotomy. -He will follow back up with [place] in 3 months with repeat labs.  2.  Tobacco abuse: -He smokes 1 pack/day for 50 years. -CT lung screening protocol on 04/21/2019 which was lung RADS 2.  Annual screenings was recommended in 12 months. -It was also discussed with him about other findings including dilatation of pulmonary trunk, 4.5 cm in diameter concerning for pulmonary artery hypertension.  He will discuss this with his cardiologist.  3.  Intermittent mild thrombocytopenia: -He has intermittent mild thrombocytopenia since February 2020. -Labs last done on 01/04/2020 shows a platelet count of 170.

## 2020-01-04 NOTE — Addendum Note (Signed)
Addended by: Glennie Isle on: 01/04/2020 02:32 PM   Modules accepted: Orders

## 2020-01-04 NOTE — Patient Instructions (Signed)
[place] at [facility] Discharge Instructions  Received phlebotomy today. Follow-up as scheduled   Thank you for choosing [place] at [facility] to provide your oncology and hematology care.  To afford each patient quality time with our provider, please arrive at least 15 minutes before your scheduled appointment time.   If you have a lab appointment with the [place] [facility] please come in thru the Main Entrance and check in at the main information desk.  You need to re-schedule your appointment should you arrive 10 or more minutes late.  We strive to give you quality time with our providers, and arriving late affects you and other patients whose appointments are after yours.  Also, if you no show three or more times for appointments you may be dismissed from the clinic at the providers discretion.     Again, thank you for choosing [facility].  Our hope is that these requests will decrease the amount of time that you wait before being seen by our physicians.       _____________________________________________________________  Should you have questions after your visit to [facility], please contact our office at [phone] between the hours of 8:00 a.m. and 4:30 p.m.  Voicemails left after 4:00 p.m. will not be returned until the following business day.  For prescription refill requests, have your pharmacy contact our office and allow 72 hours.    Due to Covid, you will need to wear a mask upon entering the hospital. If you do not have a mask, a mask will be given to you at the Main Entrance upon arrival. For doctor visits, patients may have 1 support person with them. For treatment visits, patients can not have anyone with them due to social distancing guidelines and our immunocompromised population.

## 2020-01-12 ENCOUNTER — Other Ambulatory Visit: Payer: Self-pay | Admitting: Family Medicine

## 2020-01-13 ENCOUNTER — Encounter: Payer: Self-pay | Admitting: Family Medicine

## 2020-02-15 ENCOUNTER — Encounter: Payer: Self-pay | Admitting: Family Medicine

## 2020-02-15 NOTE — Telephone Encounter (Signed)
lmtcb to make appt.

## 2020-02-18 ENCOUNTER — Inpatient Hospital Stay (HOSPITAL_COMMUNITY): Payer: Medicare Other | Attending: Nurse Practitioner

## 2020-02-18 ENCOUNTER — Other Ambulatory Visit: Payer: Self-pay

## 2020-02-18 ENCOUNTER — Encounter (HOSPITAL_COMMUNITY): Payer: Self-pay

## 2020-02-18 DIAGNOSIS — D751 Secondary polycythemia: Secondary | ICD-10-CM | POA: Diagnosis not present

## 2020-02-18 NOTE — Patient Instructions (Signed)
[facility] at [facility]  Discharge Instructions:   _______________________________________________________________  Thank you for choosing [facility] at [facility] to provide your oncology and hematology care.  To afford each patient quality time with our providers, please arrive at least 15 minutes before your scheduled appointment.  You need to re-schedule your appointment if you arrive 10 or more minutes late.  We strive to give you quality time with our providers, and arriving late affects you and other patients whose appointments are after yours.  Also, if you no show three or more times for appointments you may be dismissed from the clinic.  Again, thank you for choosing [facility] at [facility]. Our hope is that these requests will allow you access to exceptional care and in a timely manner. _______________________________________________________________  If you have questions after your visit, please contact our office at [phone] between the hours of 8:30 a.m. and 5:00 p.m. Voicemails left after 4:30 p.m. will not be returned until the following business day. _______________________________________________________________  For prescription refill requests, have your pharmacy contact our office. _______________________________________________________________  Recommendations made by the consultant and any test results will be sent to your referring physician. _______________________________________________________________ 

## 2020-02-18 NOTE — Progress Notes (Signed)
Philip Powell presents today for phlebotomy per MD orders. Phlebotomy procedure started at 11:50 am and ended at 12:14 am. 500 mls removed Patient tolerated procedure well. IV needle removed intact.  Phlebotomy given today per MD orders.  Vital signs stable. No complaints at this time. Discharged from clinic ambulatory. F/U with [facility] as scheduled.

## 2020-03-01 MED ORDER — ALBUTEROL SULFATE HFA 108 (90 BASE) MCG/ACT IN AERS: 2.0000 | INHALATION_SPRAY | Freq: Four times a day (QID) | RESPIRATORY_TRACT | 3 refills | Status: AC | PRN

## 2020-03-04 ENCOUNTER — Other Ambulatory Visit: Payer: Self-pay | Admitting: Family

## 2020-03-04 NOTE — Progress Notes (Signed)
[facility] called stating patient was brought by EMS performing CPR. He died on arrival and wants [facility] to complete death certificate.

## 2020-03-07 ENCOUNTER — Telehealth: Payer: Self-pay | Admitting: Family Medicine

## 2020-03-07 NOTE — Telephone Encounter (Signed)
I am very sorry to hear that. I enjoyed our visits. Can someone please mark him as deceased in [place]?

## 2020-03-07 NOTE — Telephone Encounter (Signed)
Put in epic as deceased

## 2020-03-09 ENCOUNTER — Other Ambulatory Visit: Payer: Self-pay | Admitting: Family

## 2020-03-17 ENCOUNTER — Ambulatory Visit: Payer: Medicare Other | Admitting: Family Medicine

## 2020-03-28 ENCOUNTER — Ambulatory Visit (HOSPITAL_COMMUNITY): Payer: Medicare Other | Admitting: Nurse Practitioner

## 2020-03-28 ENCOUNTER — Encounter (HOSPITAL_COMMUNITY): Payer: Medicare Other

## 2020-03-28 ENCOUNTER — Other Ambulatory Visit (HOSPITAL_COMMUNITY): Payer: Medicare Other

## 2020-03-29 ENCOUNTER — Encounter (HOSPITAL_COMMUNITY): Payer: Medicare Other

## 2020-03-29 ENCOUNTER — Other Ambulatory Visit (HOSPITAL_COMMUNITY): Payer: Medicare Other

## 2020-03-29 ENCOUNTER — Ambulatory Visit (HOSPITAL_COMMUNITY): Payer: Medicare Other | Admitting: Nurse Practitioner

## 2020-03-29 DEATH — deceased

## 2020-05-15 IMAGING — CT CT CHEST LUNG CANCER SCREENING LOW DOSE W/O CM
2 of 3 series · 15 of 36 positions shown, 18 images · non-contrast
Comparison: No priors.

CLINICAL DATA: 68-year-old male current smoker with 50 pack-year
history of smoking. Lung cancer screening examination.

EXAM:
CT CHEST WITHOUT CONTRAST LOW-DOSE FOR LUNG CANCER SCREENING
TECHNIQUE: Multidetector CT imaging of the chest was performed following the
standard protocol without IV contrast.

[Series 2: axial st · axial · 0.78mm/px · z∈[-450,-135]mm · 12 of 75 slices shown, 15 images]
[im 6/75  mediastinal]
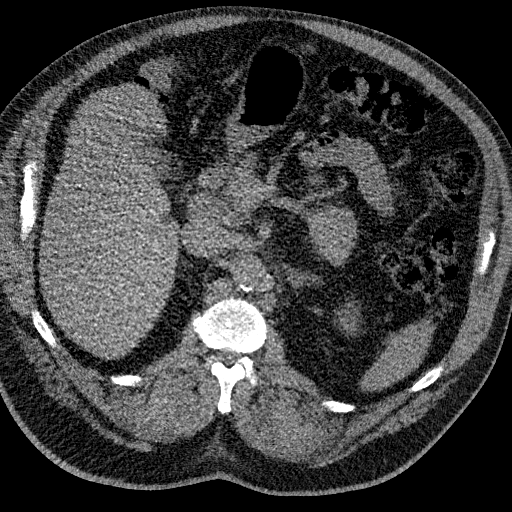
[im 6/75  lung]
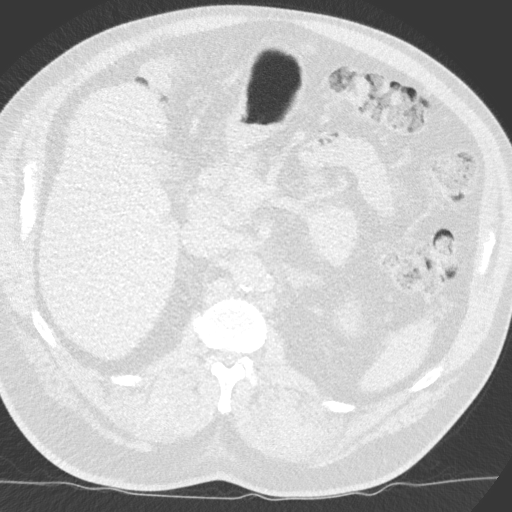
[im 11/75  lung]
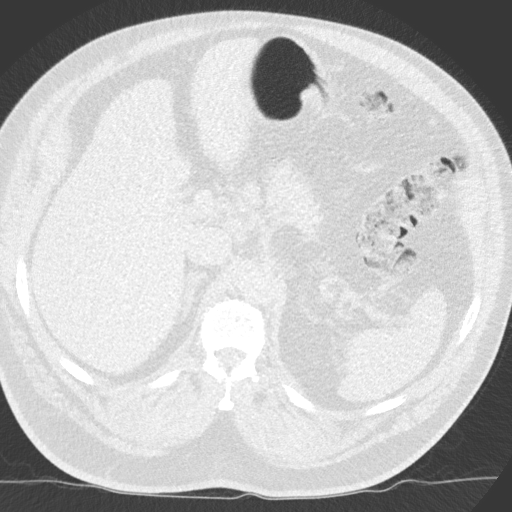
[im 17/75  lung]
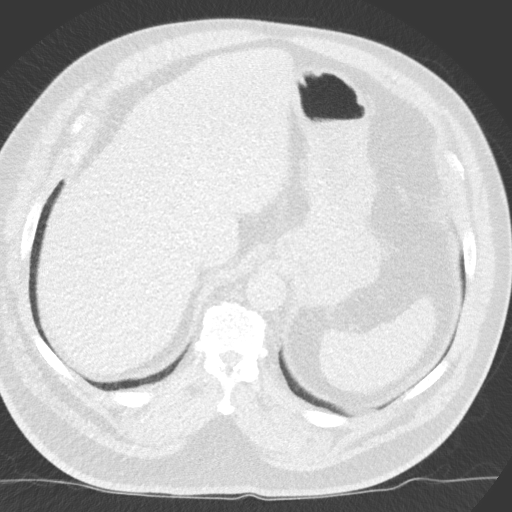
[im 22/75  lung]
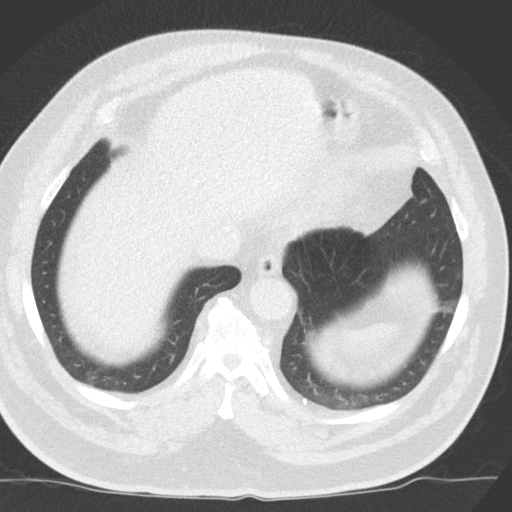
[im 28/75  mediastinal]
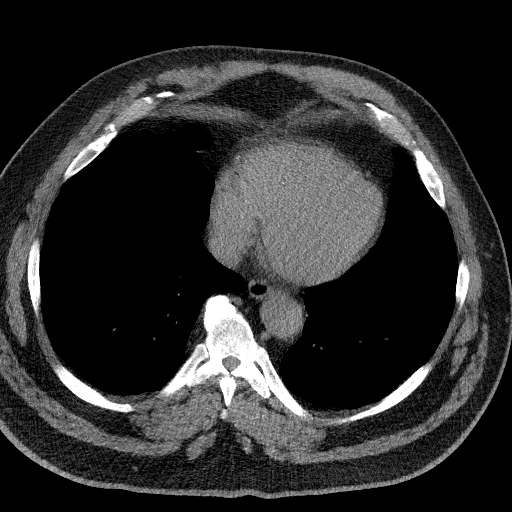
[im 28/75  lung]
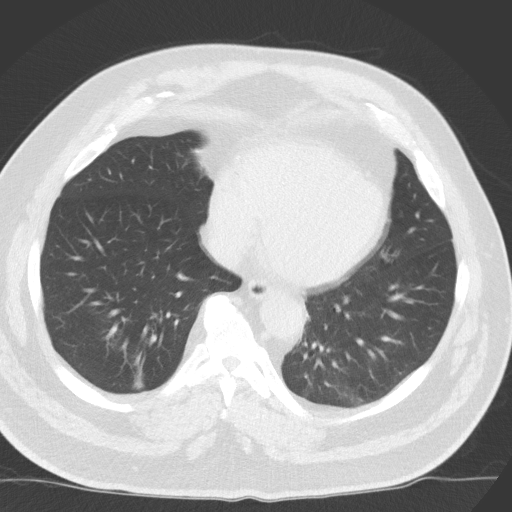
[im 33/75  lung]
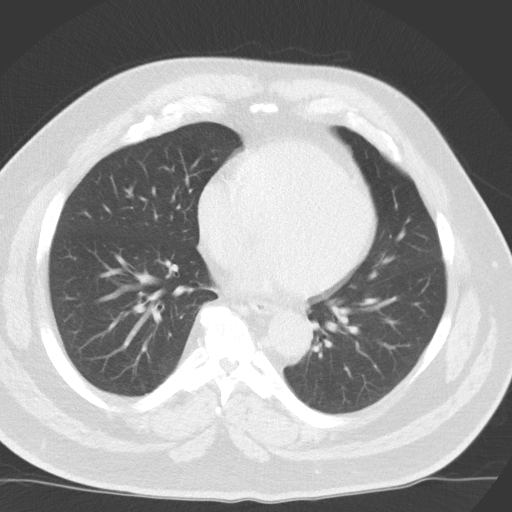
[im 42/75  lung]
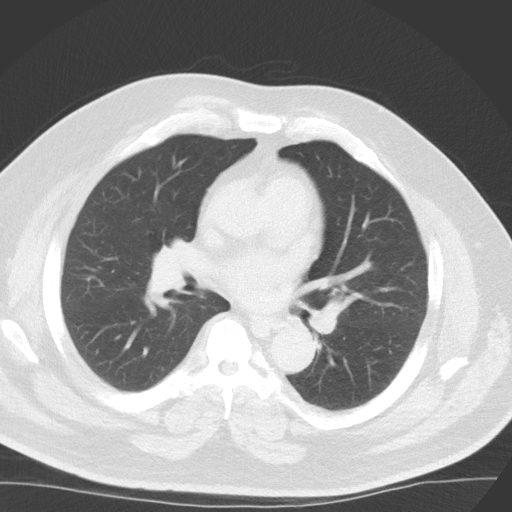
[im 47/75  lung]
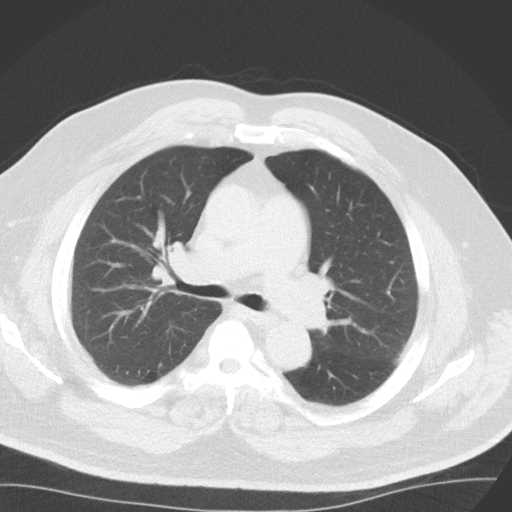
[im 53/75  mediastinal]
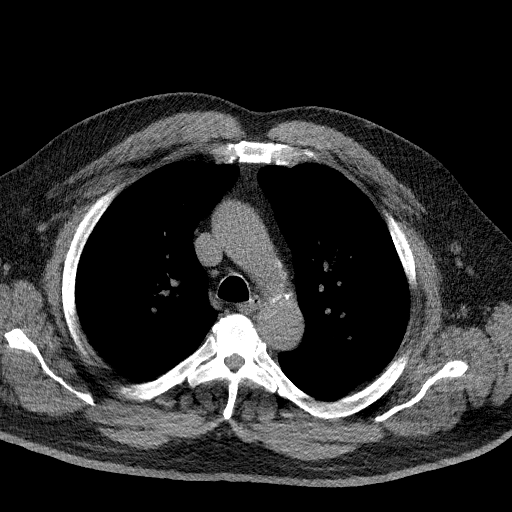
[im 53/75  lung]
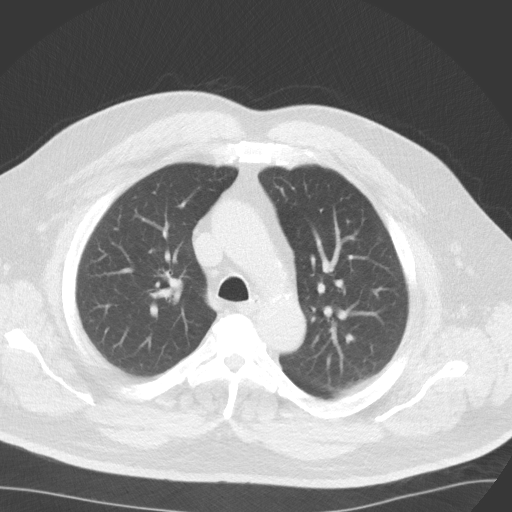
[im 58/75  lung]
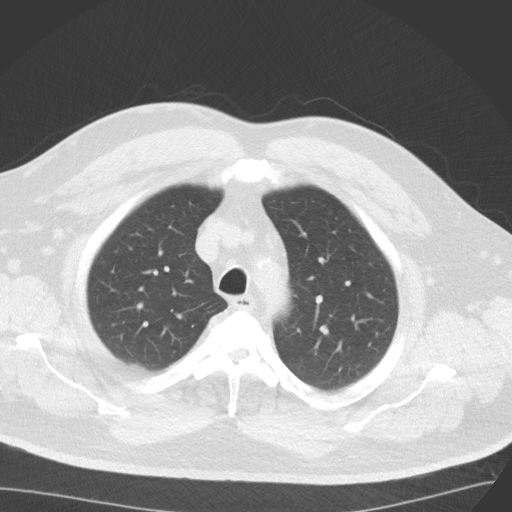
[im 64/75  lung]
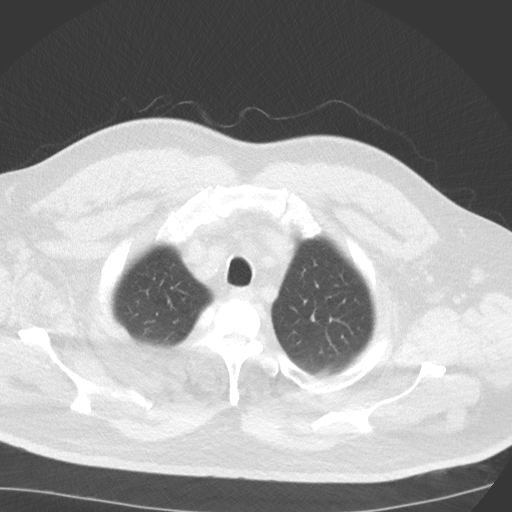
[im 69/75  lung]
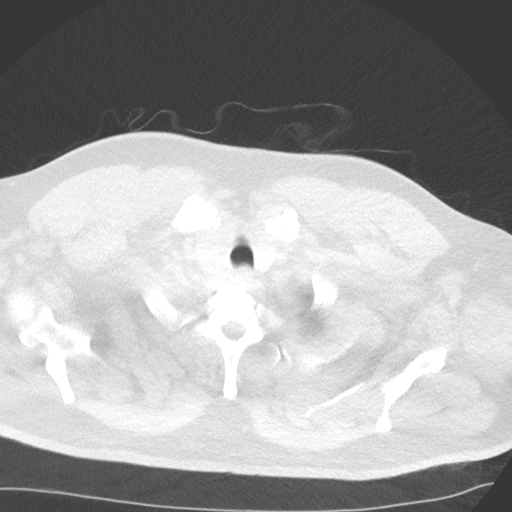

[Series 6: coronal · coronal · 0.74mm/px · 3 of 301 slices shown]
[im 61/301  lung]
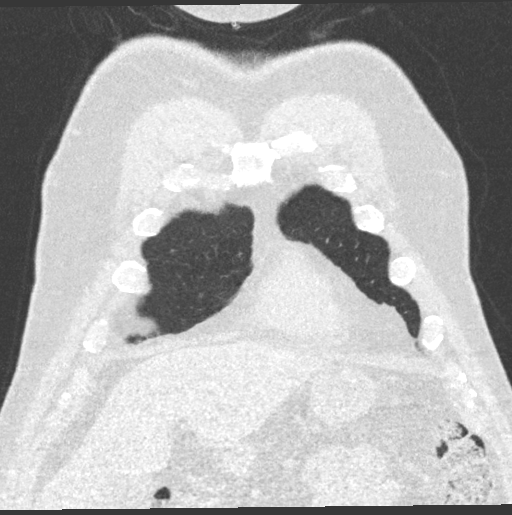
[im 121/301  lung]
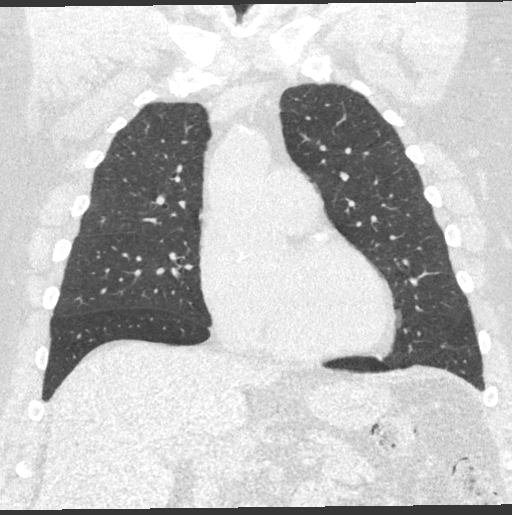
[im 181/301  lung]
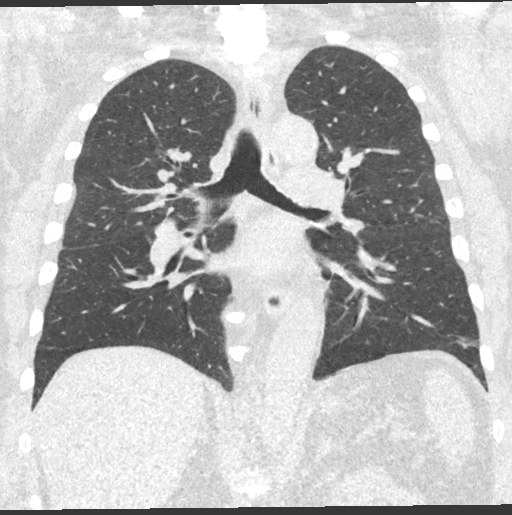

[15 of 36 positions shown; findings below may reference images not displayed]

FINDINGS: Cardiovascular: Heart size is normal. There is no significant
pericardial fluid, thickening or pericardial calcification. There is
aortic atherosclerosis, as well as atherosclerosis of the great
vessels of the mediastinum and the coronary arteries, including
calcified atherosclerotic plaque in the left main, left anterior
descending, left circumflex and right coronary arteries.
Calcifications of the aortic valve. Dilatation of the pulmonic trunk
(4.5 cm in diameter), concerning for pulmonary arterial
hypertension.

Mediastinum/Nodes: No pathologically enlarged mediastinal or hilar
lymph nodes. Please note that accurate exclusion of hilar adenopathy
is limited on noncontrast CT scans. Esophagus is unremarkable in
appearance. No axillary lymphadenopathy.

Lungs/Pleura: Small pulmonary nodule in the right middle lobe
abutting the minor fissure (axial image 183 of series 3), with a
volume derived mean diameter of 2.5 mm. No larger more suspicious
appearing pulmonary nodules or masses are noted. No acute
consolidative airspace disease. No pleural effusions. Diffuse
bronchial wall thickening with mild centrilobular and paraseptal
emphysema.

Upper Abdomen: Aortic atherosclerosis.

Musculoskeletal: There are no aggressive appearing lytic or blastic
lesions noted in the visualized portions of the skeleton.
IMPRESSION: 1. Lung-RADS 2S, benign appearance or behavior. Continue annual
screening with low-dose chest CT without contrast in 12 months.
2. The "S" modifier above refers to potentially clinically
significant non lung cancer related findings. Specifically, there is
aortic atherosclerosis, in addition to left main and 3 vessel
coronary artery disease. Please note that although the presence of
coronary artery calcium documents the presence of coronary artery
disease, the severity of this disease and any potential stenosis
cannot be assessed on this non-gated CT examination. Assessment for
potential risk factor modification, dietary therapy or pharmacologic
therapy may be warranted, if clinically indicated.
3. Mild diffuse bronchial wall thickening with mild centrilobular
and paraseptal emphysema; imaging findings suggestive of underlying
COPD.
4. Dilatation of the pulmonic trunk (4.5 cm in diameter), concerning
for pulmonary arterial hypertension.
5. There are calcifications of the aortic valve. Echocardiographic
correlation for evaluation of potential valvular dysfunction may be
warranted if clinically indicated.

Aortic Atherosclerosis (6HCXN-HYE.E) and Emphysema (6HCXN-Z1N.F).

## 2020-09-28 ENCOUNTER — Ambulatory Visit: Payer: Medicare Other | Admitting: Internal Medicine
